# Patient Record
Sex: Male | Born: 1950
Health system: Southern US, Community
[De-identification: ages and names within clinical notes are randomized; demographics above are authoritative.]

## PROBLEM LIST (undated history)

## (undated) DIAGNOSIS — J449 Chronic obstructive pulmonary disease, unspecified: Secondary | ICD-10-CM

## (undated) DIAGNOSIS — J189 Pneumonia, unspecified organism: Secondary | ICD-10-CM

## (undated) DIAGNOSIS — R0602 Shortness of breath: Secondary | ICD-10-CM

## (undated) DIAGNOSIS — N189 Chronic kidney disease, unspecified: Secondary | ICD-10-CM

## (undated) DIAGNOSIS — J324 Chronic pansinusitis: Secondary | ICD-10-CM

## (undated) DIAGNOSIS — K219 Gastro-esophageal reflux disease without esophagitis: Secondary | ICD-10-CM

## (undated) DIAGNOSIS — R05 Cough: Secondary | ICD-10-CM

## (undated) DIAGNOSIS — I10 Essential (primary) hypertension: Secondary | ICD-10-CM

## (undated) DIAGNOSIS — R053 Chronic cough: Secondary | ICD-10-CM

## (undated) HISTORY — DX: Pneumonia, unspecified organism: J18.9

## (undated) HISTORY — DX: Chronic cough: R05.3

## (undated) HISTORY — DX: Chronic pansinusitis: J32.4

## (undated) HISTORY — DX: Chronic obstructive pulmonary disease, unspecified: J44.9

## (undated) HISTORY — DX: Cough: R05

---

## 2004-10-26 ENCOUNTER — Emergency Department (HOSPITAL_COMMUNITY): Admission: EM | Admit: 2004-10-26 | Discharge: 2004-10-26 | Payer: Self-pay | Admitting: Emergency Medicine

## 2006-02-11 HISTORY — PX: EYE SURGERY: SHX253

## 2006-08-12 HISTORY — PX: OTHER SURGICAL HISTORY: SHX169

## 2006-08-25 ENCOUNTER — Ambulatory Visit (HOSPITAL_COMMUNITY): Admission: EM | Admit: 2006-08-25 | Discharge: 2006-08-26 | Payer: Self-pay | Admitting: Emergency Medicine

## 2007-07-31 ENCOUNTER — Emergency Department (HOSPITAL_COMMUNITY): Admission: EM | Admit: 2007-07-31 | Discharge: 2007-07-31 | Payer: Self-pay | Admitting: Emergency Medicine

## 2007-07-31 DIAGNOSIS — J189 Pneumonia, unspecified organism: Secondary | ICD-10-CM

## 2007-07-31 HISTORY — DX: Pneumonia, unspecified organism: J18.9

## 2007-08-10 ENCOUNTER — Inpatient Hospital Stay (HOSPITAL_COMMUNITY): Admission: EM | Admit: 2007-08-10 | Discharge: 2007-08-10 | Payer: Self-pay | Admitting: Emergency Medicine

## 2007-08-18 ENCOUNTER — Ambulatory Visit: Admission: RE | Admit: 2007-08-18 | Discharge: 2007-08-18 | Payer: Self-pay | Admitting: Family Medicine

## 2007-10-22 ENCOUNTER — Ambulatory Visit: Payer: Self-pay | Admitting: Pulmonary Disease

## 2007-10-22 ENCOUNTER — Inpatient Hospital Stay (HOSPITAL_COMMUNITY): Admission: EM | Admit: 2007-10-22 | Discharge: 2007-10-26 | Payer: Self-pay | Admitting: Emergency Medicine

## 2007-11-05 DIAGNOSIS — K219 Gastro-esophageal reflux disease without esophagitis: Secondary | ICD-10-CM | POA: Insufficient documentation

## 2007-11-05 DIAGNOSIS — J4489 Other specified chronic obstructive pulmonary disease: Secondary | ICD-10-CM | POA: Insufficient documentation

## 2007-11-05 DIAGNOSIS — J449 Chronic obstructive pulmonary disease, unspecified: Secondary | ICD-10-CM | POA: Insufficient documentation

## 2007-11-06 ENCOUNTER — Ambulatory Visit: Payer: Self-pay | Admitting: Internal Medicine

## 2007-11-06 DIAGNOSIS — J159 Unspecified bacterial pneumonia: Secondary | ICD-10-CM | POA: Insufficient documentation

## 2007-11-30 ENCOUNTER — Telehealth (INDEPENDENT_AMBULATORY_CARE_PROVIDER_SITE_OTHER): Payer: Self-pay | Admitting: *Deleted

## 2007-11-30 ENCOUNTER — Ambulatory Visit: Payer: Self-pay | Admitting: Pulmonary Disease

## 2007-11-30 DIAGNOSIS — J449 Chronic obstructive pulmonary disease, unspecified: Secondary | ICD-10-CM | POA: Insufficient documentation

## 2007-11-30 DIAGNOSIS — R05 Cough: Secondary | ICD-10-CM

## 2007-11-30 DIAGNOSIS — R059 Cough, unspecified: Secondary | ICD-10-CM | POA: Insufficient documentation

## 2007-12-03 ENCOUNTER — Telehealth: Payer: Self-pay | Admitting: Internal Medicine

## 2007-12-05 ENCOUNTER — Inpatient Hospital Stay (HOSPITAL_COMMUNITY): Admission: EM | Admit: 2007-12-05 | Discharge: 2007-12-07 | Payer: Self-pay | Admitting: Emergency Medicine

## 2007-12-07 ENCOUNTER — Ambulatory Visit: Payer: Self-pay | Admitting: Internal Medicine

## 2007-12-15 ENCOUNTER — Ambulatory Visit: Payer: Self-pay | Admitting: Internal Medicine

## 2007-12-15 DIAGNOSIS — J45909 Unspecified asthma, uncomplicated: Secondary | ICD-10-CM | POA: Insufficient documentation

## 2008-02-03 ENCOUNTER — Telehealth: Payer: Self-pay | Admitting: Internal Medicine

## 2008-05-31 ENCOUNTER — Ambulatory Visit: Payer: Self-pay | Admitting: Internal Medicine

## 2008-06-21 ENCOUNTER — Ambulatory Visit: Payer: Self-pay | Admitting: Internal Medicine

## 2008-06-21 DIAGNOSIS — B370 Candidal stomatitis: Secondary | ICD-10-CM | POA: Insufficient documentation

## 2008-06-27 ENCOUNTER — Telehealth (INDEPENDENT_AMBULATORY_CARE_PROVIDER_SITE_OTHER): Payer: Self-pay | Admitting: *Deleted

## 2008-09-05 ENCOUNTER — Ambulatory Visit: Payer: Self-pay | Admitting: Internal Medicine

## 2008-09-07 ENCOUNTER — Encounter: Payer: Self-pay | Admitting: Internal Medicine

## 2008-09-28 ENCOUNTER — Telehealth: Payer: Self-pay | Admitting: Internal Medicine

## 2008-10-10 ENCOUNTER — Telehealth (INDEPENDENT_AMBULATORY_CARE_PROVIDER_SITE_OTHER): Payer: Self-pay | Admitting: *Deleted

## 2008-10-11 ENCOUNTER — Ambulatory Visit: Payer: Self-pay | Admitting: Internal Medicine

## 2008-11-08 ENCOUNTER — Ambulatory Visit: Payer: Self-pay | Admitting: Internal Medicine

## 2008-12-05 ENCOUNTER — Ambulatory Visit: Payer: Self-pay | Admitting: Internal Medicine

## 2009-03-14 DEATH — deceased

## 2009-04-28 ENCOUNTER — Emergency Department (HOSPITAL_COMMUNITY): Admission: EM | Admit: 2009-04-28 | Discharge: 2009-04-28 | Payer: Self-pay | Admitting: Emergency Medicine

## 2009-08-03 ENCOUNTER — Ambulatory Visit: Payer: Self-pay | Admitting: Family Medicine

## 2009-08-03 ENCOUNTER — Observation Stay (HOSPITAL_COMMUNITY)
Admission: EM | Admit: 2009-08-03 | Discharge: 2009-08-04 | Payer: Self-pay | Source: Home / Self Care | Admitting: Emergency Medicine

## 2009-09-13 ENCOUNTER — Ambulatory Visit: Payer: Self-pay | Admitting: Internal Medicine

## 2009-09-14 ENCOUNTER — Ambulatory Visit: Payer: Self-pay | Admitting: Cardiovascular Disease

## 2009-09-27 ENCOUNTER — Ambulatory Visit: Payer: Self-pay | Admitting: Internal Medicine

## 2009-10-18 ENCOUNTER — Telehealth: Payer: Self-pay | Admitting: Internal Medicine

## 2009-10-18 ENCOUNTER — Telehealth (INDEPENDENT_AMBULATORY_CARE_PROVIDER_SITE_OTHER): Payer: Self-pay | Admitting: *Deleted

## 2009-10-25 ENCOUNTER — Ambulatory Visit: Payer: Self-pay | Admitting: Internal Medicine

## 2009-10-31 ENCOUNTER — Ambulatory Visit: Payer: Self-pay | Admitting: Internal Medicine

## 2009-11-01 ENCOUNTER — Telehealth: Payer: Self-pay | Admitting: Internal Medicine

## 2009-11-02 ENCOUNTER — Telehealth (INDEPENDENT_AMBULATORY_CARE_PROVIDER_SITE_OTHER): Payer: Self-pay | Admitting: *Deleted

## 2009-11-02 ENCOUNTER — Encounter: Payer: Self-pay | Admitting: Internal Medicine

## 2009-11-07 ENCOUNTER — Telehealth (INDEPENDENT_AMBULATORY_CARE_PROVIDER_SITE_OTHER): Payer: Self-pay | Admitting: *Deleted

## 2009-11-14 ENCOUNTER — Ambulatory Visit: Payer: Self-pay | Admitting: Internal Medicine

## 2009-11-15 ENCOUNTER — Telehealth (INDEPENDENT_AMBULATORY_CARE_PROVIDER_SITE_OTHER): Payer: Self-pay | Admitting: *Deleted

## 2009-11-15 ENCOUNTER — Ambulatory Visit: Payer: Self-pay | Admitting: Cardiovascular Disease

## 2009-11-16 DIAGNOSIS — J019 Acute sinusitis, unspecified: Secondary | ICD-10-CM | POA: Insufficient documentation

## 2009-11-29 ENCOUNTER — Ambulatory Visit: Payer: Self-pay | Admitting: Internal Medicine

## 2010-01-10 ENCOUNTER — Ambulatory Visit: Payer: Self-pay | Admitting: Internal Medicine

## 2010-02-09 ENCOUNTER — Telehealth: Payer: Self-pay | Admitting: Internal Medicine

## 2010-02-28 ENCOUNTER — Ambulatory Visit: Admit: 2010-02-28 | Payer: Self-pay | Admitting: Internal Medicine

## 2010-03-04 ENCOUNTER — Encounter: Payer: Self-pay | Admitting: Internal Medicine

## 2010-03-08 ENCOUNTER — Inpatient Hospital Stay (HOSPITAL_COMMUNITY)
Admission: EM | Admit: 2010-03-08 | Discharge: 2010-03-12 | Payer: Self-pay | Source: Home / Self Care | Attending: Pulmonary Disease | Admitting: Pulmonary Disease

## 2010-03-08 LAB — COMPREHENSIVE METABOLIC PANEL
ALT: 19 U/L (ref 0–53)
Alkaline Phosphatase: 60 U/L (ref 39–117)
BUN: 17 mg/dL (ref 6–23)
Creatinine, Ser: 1.49 mg/dL (ref 0.4–1.5)
GFR calc Af Amer: 58 mL/min — ABNORMAL LOW (ref >60)
GFR calc non Af Amer: 48 mL/min — ABNORMAL LOW (ref >60)
Sodium: 138 mEq/L (ref 135–145)
Total Bilirubin: 0.4 mg/dL (ref 0.3–1.2)

## 2010-03-08 LAB — CBC
HCT: 43.5 % (ref 39.0–52.0)
Hemoglobin: 14.1 g/dL (ref 13.0–17.0)
MCV: 83.5 fL (ref 78.0–100.0)
RBC: 5.21 MIL/uL (ref 4.22–5.81)
RDW: 14.5 % (ref 11.5–15.5)

## 2010-03-08 LAB — POCT I-STAT 3, ART BLOOD GAS (G3+)
Acid-base deficit: 4 mmol/L — ABNORMAL HIGH (ref 0.0–2.0)
TCO2: 28 mmol/L (ref 0–100)
pH, Arterial: 7.192 — CL (ref 7.350–7.450)

## 2010-03-08 LAB — POCT I-STAT 3, VENOUS BLOOD GAS (G3P V)
Bicarbonate: 29.6 mEq/L — ABNORMAL HIGH (ref 20.0–24.0)
O2 Saturation: 78 %
pH, Ven: 7.021 — CL (ref 7.250–7.300)
pO2, Ven: 65 mmHg — ABNORMAL HIGH (ref 30.0–45.0)

## 2010-03-08 LAB — POCT I-STAT, CHEM 8
BUN: 22 mg/dL (ref 6–23)
Hemoglobin: 16 g/dL (ref 13.0–17.0)
Sodium: 139 mEq/L (ref 135–145)
TCO2: 30 mmol/L (ref 0–100)

## 2010-03-08 LAB — DIFFERENTIAL
Eosinophils Absolute: 1.2 10*3/uL — ABNORMAL HIGH (ref 0.0–0.7)
Lymphocytes Relative: 59 % — ABNORMAL HIGH (ref 12–46)
Lymphs Abs: 7.3 10*3/uL — ABNORMAL HIGH (ref 0.7–4.0)
Monocytes Absolute: 0.9 10*3/uL (ref 0.1–1.0)
Monocytes Relative: 7 % (ref 3–12)
Neutro Abs: 2.9 10*3/uL (ref 1.7–7.7)

## 2010-03-08 LAB — URINALYSIS, ROUTINE W REFLEX MICROSCOPIC
Bilirubin Urine: NEGATIVE
Ketones, ur: NEGATIVE mg/dL
Nitrite: NEGATIVE

## 2010-03-08 LAB — PROTIME-INR: Prothrombin Time: 14.2 seconds (ref 11.6–15.2)

## 2010-03-08 LAB — POCT CARDIAC MARKERS
CKMB, poc: 1.8 ng/mL (ref 1.0–8.0)
Troponin i, poc: <0.05 ng/mL (ref 0.00–0.09)

## 2010-03-08 LAB — ETHANOL: Alcohol, Ethyl (B): <5 mg/dL (ref 0–10)

## 2010-03-08 LAB — URINE MICROSCOPIC-ADD ON

## 2010-03-09 DIAGNOSIS — J9600 Acute respiratory failure, unspecified whether with hypoxia or hypercapnia: Secondary | ICD-10-CM

## 2010-03-09 DIAGNOSIS — J449 Chronic obstructive pulmonary disease, unspecified: Secondary | ICD-10-CM

## 2010-03-09 LAB — BLOOD GAS, ARTERIAL
Acid-Base Excess: 0.2 mmol/L (ref 0.0–2.0)
Drawn by: 33176
FIO2: 0.5 %
MECHVT: 620 mL
O2 Saturation: 99.1 %
RATE: 14 resp/min

## 2010-03-09 LAB — URINE CULTURE: Culture: NO GROWTH

## 2010-03-09 LAB — CARDIAC PANEL(CRET KIN+CKTOT+MB+TROPI)
CK, MB: 3.8 ng/mL (ref 0.3–4.0)
CK, MB: 3.9 ng/mL (ref 0.3–4.0)
Relative Index: 1.2 (ref 0.0–2.5)
Total CK: 305 U/L — ABNORMAL HIGH (ref 7–232)
Total CK: 305 U/L — ABNORMAL HIGH (ref 7–232)
Troponin I: 0.04 ng/mL (ref 0.00–0.06)
Troponin I: 0.02 ng/mL (ref 0.00–0.06)
Troponin I: 0.02 ng/mL (ref 0.00–0.06)

## 2010-03-09 LAB — GLUCOSE, CAPILLARY
Glucose-Capillary: 120 mg/dL — ABNORMAL HIGH (ref 70–99)
Glucose-Capillary: 148 mg/dL — ABNORMAL HIGH (ref 70–99)

## 2010-03-09 LAB — CBC
MCH: 26.8 pg (ref 26.0–34.0)
MCV: 81.1 fL (ref 78.0–100.0)
Platelets: 171 10*3/uL (ref 150–400)
RBC: 4.92 MIL/uL (ref 4.22–5.81)
RDW: 14.3 % (ref 11.5–15.5)

## 2010-03-09 LAB — BASIC METABOLIC PANEL
BUN: 12 mg/dL (ref 6–23)
Chloride: 106 mEq/L (ref 96–112)
Creatinine, Ser: 1.16 mg/dL (ref 0.4–1.5)
Glucose, Bld: 114 mg/dL — ABNORMAL HIGH (ref 70–99)

## 2010-03-09 LAB — LEGIONELLA ANTIGEN, URINE

## 2010-03-10 LAB — DIFFERENTIAL
Eosinophils Absolute: 0 10*3/uL (ref 0.0–0.7)
Eosinophils Relative: 0 % (ref 0–5)
Lymphocytes Relative: 7 % — ABNORMAL LOW (ref 12–46)
Lymphs Abs: 0.8 10*3/uL (ref 0.7–4.0)
Monocytes Absolute: 0.5 10*3/uL (ref 0.1–1.0)
Monocytes Relative: 5 % (ref 3–12)

## 2010-03-10 LAB — CBC
HCT: 42.9 % (ref 39.0–52.0)
MCH: 26.7 pg (ref 26.0–34.0)
MCV: 80.6 fL (ref 78.0–100.0)
Platelets: 176 10*3/uL (ref 150–400)
RDW: 14.4 % (ref 11.5–15.5)

## 2010-03-10 LAB — BASIC METABOLIC PANEL
BUN: 13 mg/dL (ref 6–23)
Chloride: 105 mEq/L (ref 96–112)
Creatinine, Ser: 0.94 mg/dL (ref 0.4–1.5)
GFR calc non Af Amer: >60 mL/min (ref >60)
Glucose, Bld: 130 mg/dL — ABNORMAL HIGH (ref 70–99)
Potassium: 4.3 mEq/L (ref 3.5–5.1)

## 2010-03-10 LAB — GLUCOSE, CAPILLARY
Glucose-Capillary: 133 mg/dL — ABNORMAL HIGH (ref 70–99)
Glucose-Capillary: 130 mg/dL — ABNORMAL HIGH (ref 70–99)
Glucose-Capillary: 161 mg/dL — ABNORMAL HIGH (ref 70–99)

## 2010-03-11 LAB — BASIC METABOLIC PANEL
CO2: 22 mEq/L (ref 19–32)
Chloride: 105 mEq/L (ref 96–112)
GFR calc Af Amer: >60 mL/min (ref >60)
Potassium: 4.4 mEq/L (ref 3.5–5.1)
Sodium: 135 mEq/L (ref 135–145)

## 2010-03-11 LAB — GLUCOSE, CAPILLARY
Glucose-Capillary: 140 mg/dL — ABNORMAL HIGH (ref 70–99)
Glucose-Capillary: 118 mg/dL — ABNORMAL HIGH (ref 70–99)

## 2010-03-11 LAB — CULTURE, RESPIRATORY W GRAM STAIN: Culture: NORMAL

## 2010-03-11 LAB — CBC
Hemoglobin: 13.8 g/dL (ref 13.0–17.0)
MCH: 27 pg (ref 26.0–34.0)
RBC: 5.11 MIL/uL (ref 4.22–5.81)

## 2010-03-12 DIAGNOSIS — J9600 Acute respiratory failure, unspecified whether with hypoxia or hypercapnia: Secondary | ICD-10-CM

## 2010-03-12 DIAGNOSIS — J449 Chronic obstructive pulmonary disease, unspecified: Secondary | ICD-10-CM

## 2010-03-12 LAB — BASIC METABOLIC PANEL
CO2: 25 mEq/L (ref 19–32)
Calcium: 8.6 mg/dL (ref 8.4–10.5)
Creatinine, Ser: 1.19 mg/dL (ref 0.4–1.5)
Glucose, Bld: 94 mg/dL (ref 70–99)

## 2010-03-12 LAB — GLUCOSE, CAPILLARY

## 2010-03-12 LAB — CULTURE, BLOOD (ROUTINE X 2): Culture  Setup Time: 201201270216

## 2010-03-13 ENCOUNTER — Telehealth (INDEPENDENT_AMBULATORY_CARE_PROVIDER_SITE_OTHER): Payer: Self-pay | Admitting: *Deleted

## 2010-03-13 LAB — GLUCOSE, CAPILLARY

## 2010-03-15 LAB — CULTURE, BLOOD (ROUTINE X 2)
Culture  Setup Time: 201201270216
Culture: NO GROWTH

## 2010-03-15 NOTE — Progress Notes (Signed)
Summary: sputum still yellow- rx with augmentin x 10 more days  Phone Note Call from Patient Call back at [facility] Phone [phone]   Caller: Son Call For: [facility] Summary of Call: I MADE AN ERROR IN SCHEDULING (SORRY). PT CAME IN WITH HIS SON. THEY HAVE RESCHEDULED FOR NEXT WK - BUT WERE VERY NICE. LORI GAVE SON ANOTHER PROAIR AS REQUESTED. SON WANTS TO KNOW IF PT NEEDS A REFILL OF AMOXICILLIN OR SHOULD PT HAVE A CT? [facility] AT HOME # ABOVE OR CELL [phone] AND ADVISE. NOTE: APPT W/ MW IS 9/14. THANKS AND AGAIN, SORRY.  Initial call taken by: Tivis Ringer, CNA,  October 18, 2009 10:50 AM  Follow-up for Phone Call        [facility]. Need to know if pt's cough and congestion is better or worse since finshing augmentin.Michel Bickers [facility]  October 18, 2009 10:58 AM  [phone] Christen Bame, pt's son returned call to [place].  Please call him back on his cell. Follow-up by: Eugene Gavia,  October 18, 2009 11:18 AM  Additional Follow-up for Phone Call Additional follow up Details #1::        Per pt's son, cough is prod with yellowish mucus. Pt is not taking anything for the cough. Sinuses are clear per pt son but pt is using his proair more aoften due to SOB that comes with the cough. Pt is sch for follow-up on 9/14 with MW. Please advise if pt needs OV before then or recs. Son wants to know if pt should have another RX for the Augmentin.Michel Bickers CMA  October 18, 2009 11:30 AM if mucus is anything but white or very pale beige needs another 10 days augmentin  875 two times a day  Additional Follow-up by: Nyoka Cowden MD,  October 18, 2009 3:31 PM    Additional Follow-up for Phone Call Additional follow up Details #2::    Called and spoke with pt's son Christen Bame.  He states that pt's sputum is more yellow than white, he states would not describe this as beige.  Rx for augmentin was sent to [facility].  Pt's son aware. Follow-up by: Vernie Murders,  October 18, 2009 3:46  PM  New/Updated Medications: AUGMENTIN 875-125 MG TABS (AMOXICILLIN-POT CLAVULANATE) 1 by mouth two times a day until gone Prescriptions: AUGMENTIN 875-125 MG TABS (AMOXICILLIN-POT CLAVULANATE) 1 by mouth two times a day until gone  #20 x 0   Entered by:   Vernie Murders   Authorized by:   Nyoka Cowden MD   Signed by:   Vernie Murders on 10/18/2009   Method used:   Electronically to        [facility] (retail)       [address]       [place], [place]  [address]       Ph: [phone]       Fax: [phone]   RxID:   [id number]

## 2010-03-15 NOTE — Assessment & Plan Note (Signed)
Summary: Pulmonary/ f/u ov with hfa now 90% > decrease pred to 5 mg/day   Primary Provider/Referring Provider:  Dr. Janace Hoard  CC:  Cough- resolved.  History of Present Illness: 60 yo vietnamese male  quit smoking 1999 with AB vs  COPD. Was admitted 10/22/2007    AE-COPD antibiotics and steroids. Extubated on 9/11 and discharged 9/14 on symbicort and prednisone taper. Feels well.    Spirometry and walking sats  normal when treated.    September 13, 2009 cc cough "for over 2 yrs"- l .  Cough is prod with yellow sputum and is worse at bedtime.  He also c/o runny nose. improves sev weeks to maybe a month after a cortisone.  rec sinus ct > positive  sinusitis rx augmentin x 10-20 days, only took  10 because better and maintained acid suppression.  August 17, 2011cc cough is better but not gone. Still has some chest congestion and sob at times esp in am.   rec 10 days augmentin some better transiently  then worse again when finished so complete 10 more days and then do ct sinus > never had it.  October 25, 2009 ov cc SOB is worse with exertion and at rest...cough is more prod with yellow to green mucus...not sleeping well  worse x 1 week despite maintaining gerd rx.   Marked increase in saba, both hfa and neb which we were not aware he had per our med record. rec 21 days augmentin then ct sinus     see page 2    October 31, 2009 ov  c/o worsening cough x 3 days- prod with white to orange colored sputum.  He states that cough gets worse at night with wheezing and has been unable to sleep for the past 3 nights. requested to bring all meds, brought empty proaire and no neb solutions and not sure what he's putting in machine.    doesn't think any of his meds are working any more, even transiently, though not sure about this hx as didn't bring fm member>> rx pred 20mg  ceiliing/10mg  floor  November 14, 2009 --Presents for follow up and med review.  His son is with him today and helps with his meds. We  reviewed his meds and organized them into a med calendar. Recent CT sinus showed acute sinusitis, currently on augmentin x 21 days. recent xray with increased atx on right. He has couple days of augmentin left. He is feeling better. Prednisone was increased last ov due increased cough. Cough and wheezing is better.  rec hold pred at 10 mg daily.  November 29, 2009 ov cc cough, resolved, breathing fine.  using Calendar better with help of son.  Pt denies any significant sore throat, dysphagia, itching, sneezing,  nasal congestion or excess secretions,  fever, chills, sweats, unintended wt loss, pleuritic or exertional cp, hempoptysis, change in activity tolerance  orthopnea pnd or leg swelling Pt also denies any obvious fluctuation in symptoms with weather or environmental change or other alleviating or aggravating factors.     Pt denies any increase in rescue therapy over baseline, denies waking up needing it or having early am exacerbations of coughing/wheezing/ or dyspnea     Current Medications (verified): 1)  Symbicort 160-4.5 Mcg/act  Aero (Budesonide-Formoterol Fumarate) .... 2 Puffs First Thing  in Am and 2 Puffs Again in Pm About 12 Hours Later 2)  Prilosec Otc 20 Mg Tbec (Omeprazole Magnesium) .... Take  One 30-60 Min Before First Meal  of The Day 3)  Pepcid Ac Maximum Strength 20 Mg Tabs (Famotidine) .... One At Bedtime 4)  Prednisone 10 Mg  Tabs (Prednisone) .... Take 1 Tablet By Mouth Once A Day 5)  Proair Hfa 108 (90 Base) Mcg/act Aers (Albuterol Sulfate) .... 2 Puffs Every 4-6 Hrs As Needed If Short of Breath 6)  Mucinex Dm Maximum Strength 60-1200 Mg Xr12h-Tab (Dextromethorphan-Guaifenesin) .... Take 1-2 Tablets Every 12 Hours As Needed 7)  Tramadol Hcl 50 Mg  Tabs (Tramadol Hcl) .Marland Kitchen.. 1 Tab Every 4 Hours As Needed For Excessive Coughing 8)  Zyrtec Allergy 10 Mg Tabs (Cetirizine Hcl) .... Take 1 Tab By Mouth At Bedtime As Needed  Allergies (verified): No Known Drug Allergies  Past  History:  Past Medical History: 1) pneumonia bilateral, diagnosed on July 31, 2007.    2) CT negative for PE - June 2009 3) VDRF -sept 2009. Admitted for 4 days. Intubated for 1 day 4) HIV Negative - sept 2009 at [facility]  ASTHMA -      - HFA 100% effective tecnique September 27, 2009 > 75% October 31, 2009 > 90% November 29, 2009        Spirometry 11/06/2007 -> normal while on symbicort >Spirometry 05/31/2008: fev1 2.8L/65%, FVC 3.94L/74%, Ratio 71 (90), Fef 25-75%: 1.9L/44% >Spirometry 06/21/2008:  Fev1 2.76L/64%, FVC 3.84L/72%, Ratio 72 (91). -cont advair hfa >spiro 09/05/2008: Fev1 2.82L/76%, FVC 4.05L/77%, Ratio 70 (70) - cont advair 230 hfa, start singualr > d/c September 13, 2009 as cough no better on singulair Chronic cough ? etiology     - no better on qvar and singulair both stopped September 13, 2009 > no worse cough September 27, 2009      - Sinus CT 09/14/09 > pansinusitis       -  rx 21 days rx started  c augment October 25, 2009 >> repeat sinus ct:neg 11/15/09     - Daily prednisone rx October 31, 2009 >>> Health Maintenance      - Pneumovax given October 31, 2009  COMPLEX MED REGIMEN-Meds reviewed with pt education and computerized med calendar completed 11/14/09  Past Pulmonary History:  Pulmonary History: 1) pneumonia bilateral, diagnosed on July 31, 2007.  He was treated with azithromycin. Seen on CT chest (personally reviewed 11/06/2007) 2) CT negative for PE - June 2009 3) VDRF -sept 2009. Admitted for 4 days. Intubated for 1 day 4) HIV Negative - sept 2009 at [facility] 5)  COPD/ASTHMA -  >COPD based on prior smoking and cxr changes and june/sept 2009 admits for acute resp failure NOS.  > Darrold Junker has had spirometry till 11/06/2007.  > Spirometry 11/06/2007 -> normal while on symbicort > ASTHMA - diagnosed 12/15/2007 by Dr. Shelle Iron- based on decompensation off inhaled steroids in Oct 2009 and normalization of symptoms when symbicort restarted >Spirometry 05/31/2008: fev1 2.8L/65%, FVC  3.94L/74%, Ratio 71 (90), Fef 25-75%: 1.9L/44% >Spirometry 06/21/2008:  Fev1 2.76L/64%, FVC 3.84L/72%, Ratio 72 (91). -cont advair hfa >spiro 09/05/2008: Fev1 2.82L/76%, FVC 4.05L/77%, Ratio 70 (70) - cont advair 230 hfa, start singualr  Vital Signs:  Patient profile:   60 year old male Weight:      179 pounds O2 Sat:      98 % on Room air Temp:     98.1 degrees F oral Pulse rate:   80 / minute BP sitting:   122 / 84  (left arm)  Vitals Entered By: Vernie Murders (November 29, 2009 10:02 AM)  O2 Flow:  Room  air  Physical Exam  Additional Exam:  amb vietnamese male limited English, nad  wt 174 > 175  September 27, 2009  > 170 October 25, 2009  > 169 October 31, 2009 >>180 11/14/09  > 180 November 29, 2009  HEENT:  [place]/AT, , EACs-clear, TMs-wnl, NOSE-pale , THROAT-clear, no exudate NECK:  Supple w/ fair ROM; no JVD; normal carotid impulses w/o bruits; no thyromegaly or nodules palpated; no lymphadenopathy. RESP  clear to A and P  CARD:  RRR, no m/r/g   GI:   Soft & nt; nml bowel sounds; no organomegaly or masses detected. [place]co: Warm bil,  no calf tenderness edema, clubbing, pulses intact  .    Impression & Recommendations:  Problem # 1:  ASTHMA (ICD-493.90) All goals of asthma met including optimal function and elimination of symptoms with minimum need for rescue therapy. Contingencies discussed today including the rule of two's, albeit on a complex regimen than contains prednisone.  Now that sinus dz addressed should be able to simplify rx  I spent extra time with the patient today explaining optimal mdi  technique.  This improved from  75-90% p coaching  Try to taper prednisone to 5 mg per day  See instructions for specific recommendations   Problem # 2:  SINUSITIS, ACUTE (ICD-461.9)  The following medications were removed from the medication list:    Augmentin 875-125 Mg Tabs (Amoxicillin-pot clavulanate) ..... By mouth twice daily with bfast and supper and large glass of  water His updated medication list for this problem includes:    Mucinex Dm Maximum Strength 60-1200 Mg Xr12h-tab (Dextromethorphan-guaifenesin) .Marland Kitchen... Take 1-2 tablets every 12 hours as needed  CT 11/15/09 reviewed, resolved.  Medications Added to Medication List This Visit: 1)  Symbicort 160-4.5 Mcg/act Aero (Budesonide-formoterol fumarate) .... 2 puffs first thing  in am and 2 puffs again in pm about 12 hours later  Other Orders: Est. Patient Level IV (04540)  Patient Instructions: 1)  See calendar for specific medication instructions and bring it back for each and every office visit for every healthcare provider you see.  Without it,  you may not receive the best quality medical care that we feel you deserve.  2)  Please schedule a follow-up appointment in 6 weeks, sooner if needed  3)  LATE ADD:   reduce Prednisone to 5 mg PER DAY Prescriptions: SYMBICORT 160-4.5 MCG/ACT  AERO (BUDESONIDE-FORMOTEROL FUMARATE) 2 puffs first thing  in am and 2 puffs again in pm about 12 hours later  #1 x 11   Entered and Authorized by:   Nyoka Cowden MD   Signed by:   Nyoka Cowden MD on 11/29/2009   Method used:   Electronically to        [facility] (retail)       [address]       [place], [place]  [address]       Ph: [phone]       Fax: [phone]   RxID:   [id number]   Appended Document: Pulmonary/ f/u ov with hfa now 90% > decrease pred to 5 mg/day see LATE ADD for instructions  Appended Document: Pulmonary/ f/u ov with hfa now 90% > decrease pred to 5 mg/day Spoke with pt's son Ron and advised that pt needs to reduce his prednisone to 5 mg per day.  Son verbalized understanding and states will let the pt know.

## 2010-03-15 NOTE — Assessment & Plan Note (Signed)
Summary: Pulmonary/ ext ov with hfa teaching   Visit Type:  Follow-up Primary Kaeo Jacome/Referring Aquilla Voiles:  Dr. Janace Hoard  CC:  Patient is here for follow-up...SOB is worse with exertion and at rest...cough is more prod with yellow to green mucus...not sleeping well.  History of Present Illness:  11/06/2007: p hosp f/u ov:  60 year old male with hx of COPD. Was admitted 10/22/2007 with acute resp failure and hypoxemia (abg 7.34/43/112 ? post intubaiton) with cxr showing copd but without infiltrates. D-dimer slightly high at 0.86 but not evaluated furhter in context of june 2009 when  d-dimer >20/ ct-angio being negative for pe. BNP <100 at admit. Treated for AE-COPD antibiotics and steroids. Extubated on 9/11 and discharged 9/14 on symbicort and prednisone taper. Feels well.    Spirometry and walking desat test are normal. Finishing pred taper 9/25  REC: taper symbicort to LABA alone/foradil  OV 11/30/2007 Urgent basis Dr. Shelle Iron. Severe cough with near cough syncope., chest soreness and increased sob. Diffuse wheeze on exam. No spiro done. Asthma exacerbation clinically suspected due to worsning symptoms following med taper REC: restart symbicort   OV 12/15/2007:  Feels well afer going back on symbicort. Using albuterol <2t/week for rescue. But taking tussionex daily once for cough suppression but he does not think he needs it.  REC: stop tussionex, take albuterol as needed, take symbicort scheduled, attend asthma ed   OV 05/31/2008: Scheduled followup of asthma/copd. Poor historian v [employer]. Since last OV, he went to [facility] on 3/9 with ae-asthma and treated with prednisone. He had initial improvement but  re-presented for followup 05/24/2008 with recurrence of cough. IIn addition, was having  ? new yellow sputum. -> started on pred taper (ov day = 7/7) on 4/14 for clinical dx of mild AE-COPD/ASthma. A CXR per report review showed  RML infiltrate. Since then, he says he is NO better. SPIRO  today - Fev1 2.8L/65%, FVC 3.9L/74%, Ratio 71.  CXR - clear.  REC: Rx doxy x 6 days, change doxy -> advair hfa, restart 2 week pred taper  OV 06/21/2008: NEW THRUSH - Treated and taught to improve hfa technique. Ashtma itself VERY WELL CONTROLLED. Spirometry today is unchanged although symptoms are very well controlled. Fev1 today is 2.76L/64%, FVC 3.84L/72%, Ratio 72 (91). REcommened to continue advair hfa 2 puff two times a day   OV 09/05/2008. Followup asthma. Here with 2 sons who are acting as interpreter. Says was doing well till 1 month ago. He feels summer heat has made asthma worse. Son notices he is coughing a lot esp at night and in the heat. Lot of sneezing, sinus drainage and sOme associated yellow sputum and wheezing too. Albuterol helps. He  has used albuterol only 1-2 times this past week but in talking to him it appears he is more symptomatic than that.    October 11, 2008 with NP Tammy--Presents for 1 month folllow up - was given nasocort and singulair but dint help.  states cough is unchanged. Accompanied by son as interpretor. Describes minimally productive, mainly dry cough worse at night. c/o tickle in throat, drainage.  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 -no significant cough at work.   November 08, 2008 wit NP Tammy --Pt here to the office today with persistent intermittently cough.  Last visit treated w/   Prednisone taper, cough/rhinitis prevention. Finished Prednisone and has been taken Delsym for a week but the cough didn't go away.  C/o cough more  at night .  Has intermitently cough up min. amount of phlegm thin yellow in color.  still taking occ tussionex. No need for albuterol at allOV 12/05/2008. Followup ASthma/COPD and chronic nocturnal cough Started him on singulair back then but   has no idea if he is taking it. Since then he saw NP TammyParrett once in August and once in September and her notes do not mention singulair. WE called [facility] and was told he  only filled it once on 09/05/2008. Overall doing well. Taking advair hfa 2 puff two times a day, diligiently and albuterol as needed  only. Has not used albuterol in >2 months but is still c/o persistent nocturanl cough. He got pred taper in August and in syptomatic Rx in Sept visits including PPD but he did not come back for read. Cough persists.   C/o cough more at night .  Has intermitently cough up min. amount of phlegm thin yellow in color. rec qvar  September 13, 2009 Acute visit. Pt c/o persistant cough "for over 2 yrs"- last seen here in Oct 2010.  Cough is prod with yellow sputum and is worse at bedtime.  He also c/o runny nose. improves sev weeks to maybe a month after a cortisone.  rec sinus ct > positive  sinusitis rx augmentin x 10-20 days, only 10 because better and maintained acid suppression.  September 27, 2009 Cough follow-up. the patient says his cough is better but not gone. Still has some chest congestion and sob at times esp in am.   rec 10 days augmentin some better transiently  then worse again when finished.   October 25, 2009 ov cc SOB is worse with exertion and at rest...cough is more prod with yellow to green mucus...not sleeping well  worse x 1 week despite maintaining gerd rx.  Pt denies any significant sore throat, dysphagia, itching, sneezing,     fever, chills, sweats, unintended wt loss, pleuritic or exertional cp, hempoptysis,   orthopnea pnd or leg swelling. Pt also denies any obvious fluctuation in symptoms with weather or environmental change or other alleviating or aggravating factors.   Marked increase in saba, both hfa and neb which we were not aware he had     Current Medications (verified): 1)  Omeprazole 20 Mg Cpdr (Omeprazole) .... Take  One 30-60 Min Before First Meal of The Day 2)  Pepcid Ac Maximum Strength 20 Mg Tabs (Famotidine) .... One At Bedtime 3)  Tussionex Pennkinetic Er 8-10 Mg/55ml Lqcr (Chlorpheniramine-Hydrocodone) .Marland Kitchen.. 1 Tsp At Bedtime As Needed 4)   Proair Hfa 108 (90 Base) Mcg/act Aers (Albuterol Sulfate) .... 2 Puffs Every 4-6 Hrs As Needed If Short of Breath  Allergies (verified): No Known Drug Allergies  Past History:  Past Medical History: 1) pneumonia bilateral, diagnosed on July 31, 2007.    2) CT negative for PE - June 2009 3) VDRF -sept 2009. Admitted for 4 days. Intubated for 1 day 4) HIV Negative - sept 2009 at [facility]  ASTHMA -      - HFA 100% effective tecnique September 27, 2009        Spirometry 11/06/2007 -> normal while on symbicort      based on decompensation off inhaled steroids in Oct 2009 and normalization of symptoms when symbicort     restarted >Spirometry 05/31/2008: fev1 2.8L/65%, FVC 3.94L/74%, Ratio 71 (90), Fef 25-75%: 1.9L/44% >Spirometry 06/21/2008:  Fev1 2.76L/64%, FVC 3.84L/72%, Ratio 72 (91). -cont advair hfa >spiro 09/05/2008: Fev1 2.82L/76%, FVC 4.05L/77%,  Ratio 70 (70) - cont advair 230 hfa, start singualr > d/c September 13, 2009 as cough no better Chronic cough ? etiology     - no better on qvar and singulair both stopped September 13, 2009 > no worse cough September 27, 2009      - Sinus CT 09/14/09 > pansinusitis  > 21 days rx started  c augment October 25, 2009 >> repeat sinus ct:  Past Pulmonary History:  Pulmonary History: 1) pneumonia bilateral, diagnosed on July 31, 2007.  He was treated with azithromycin. Seen on CT chest (personally reviewed 11/06/2007) 2) CT negative for PE - June 2009 3) VDRF -sept 2009. Admitted for 4 days. Intubated for 1 day 4) HIV Negative - sept 2009 at [facility])  COPD/ASTHMA -  >COPD based on prior smoking and cxr changes and june/sept 2009 admits for acute resp failure NOS.  > Darrold Junker has had spirometry till 11/06/2007.  > Spirometry 11/06/2007 -> normal while on symbicort > ASTHMA - diagnosed 12/15/2007 by Dr. Shelle Iron- based on decompensation off inhaled steroids in Oct 2009 and normalization of symptoms when symbicort restarted >Spirometry 05/31/2008: fev1 2.8L/65%, FVC 3.94L/74%,  Ratio 71 (90), Fef 25-75%: 1.9L/44% >Spirometry 06/21/2008:  Fev1 2.76L/64%, FVC 3.84L/72%, Ratio 72 (91). -cont advair hfa >spiro 09/05/2008: Fev1 2.82L/76%, FVC 4.05L/77%, Ratio 70 (70) - cont advair 230 hfa, start singualr  Vital Signs:  Patient profile:   60 year old male Height:      74 inches (187.96 cm) Weight:      170.38 pounds (77.45 kg) BMI:     21.95 O2 Sat:      94 % on Room air Temp:     97.4 degrees F (36.33 degrees C) oral Pulse rate:   86 / minute BP sitting:   120 / 84  (right arm) Cuff size:   regular  Vitals Entered By: Michel Bickers CMA (October 25, 2009 10:38 AM)  O2 Sat at Rest %:  94 O2 Flow:  Room air CC: Patient is here for follow-up...SOB is worse with exertion and at rest...cough is more prod with yellow to green mucus...not sleeping well Comments Medications reviewed with patient Michel Bickers [facility]  October 25, 2009 10:39 AM   Physical Exam  Additional Exam:  amb vietnamese male limited English, nad  wt 174 > 175  September 27, 2009  > 170 October 25, 2009  HEENT:  /AT, , EACs-clear, TMs-wnl, NOSE-pale , THROAT-clear, no exudate NECK:  Supple w/ fair ROM; no JVD; normal carotid impulses w/o bruits; no thyromegaly or nodules palpated; no lymphadenopathy. RESP   insp and exp rhonchi bilaterally CARD:  RRR, no m/r/g   GI:   Soft & nt; nml bowel sounds; no organomegaly or masses detected. Musco: Warm bil,  no calf tenderness edema, clubbing, pulses intact  .    Impression & Recommendations:  Problem # 1:  ASTHMA (ICD-493.90)   DDX of  difficult airways managment all start with A and  include Adherence, Ace Inhibitors, Acid Reflux, Active Sinus Disease, Alpha 1 Antitripsin deficiency, Anxiety masquerading as Airways dz,  ABPA,  allergy(esp in young), Aspiration (esp in elderly), Adverse effects of DPI,  Active smokers, plus one B  = Beta blocker use..   Adherence: I spent extra time with the patient today explaining optimal mdi  technique.  This  improved from  50-75% Each maintenance medication was reviewed in detail including most importantly the difference between maintenance prns and under what circumstances the prns are  to be used.  In addition, these two groups (for which the patient should keep up with refills) were distinguished from a third group :  meds that are used only short term with the intent to complete a course of therapy and then not refill them.  The med list was then fully reconciled and reorganized to reflect this important distinction.  ? acitive sinusitis:  See instructions for specific recommendations   Medications Added to Medication List This Visit: 1)  Tramadol Hcl 50 Mg Tabs (Tramadol hcl) .... One to two by mouth every 4-6 hours if needed for excess cough 2)  Prednisone 10 Mg Tabs (Prednisone) .... 4 each am x 2 days,  3 x 2days, 2x2 days, and 1x2 days 3)  Augmentin 875-125 Mg Tabs (Amoxicillin-pot clavulanate) .... By mouth twice daily with bfast and supper and large glass of water  Other Orders: Misc. Referral (Misc. Ref) Est. Patient Level IV (16109)  Patient Instructions: 1)  Take Augmentin 875 mg one twice daily x 21 days then CT sinus and if still bad will need ENT eval 2)  See Patient Care Coordinator before leaving for sinus ct 3)  Work on inhaler technique:  relax and blow all the way out then take a nice smooth deep breath back in, triggering the inhaler at same time you start breathing in  4)  only use use proaire if needed the neb to back it up but use either one up to every 4 hours and if not better after neb treatment go to ER 5)  Prednisone 10 mg 4 each am x 2 days,  3 x 2days, 2x2 days, and 1x2 days 6)  Take delsym two tsp every 12 hours and add tramadol 50 mg up to 2 every 4 hours to suppress the urge to cough. Swallowing water or using ice chips/non mint and menthol containing candies (such as lifesavers or sugarless jolly ranchers) are also effective.  7)  LATE ADD; IF NOT BETTER BY THE  END OF PREDNISONE AND STILL NEEDING ALBUTEROL MORE THAN TWICE A WEEK NEEDS TO BE RESTARTED ON SYMBICORT 160 2 puffs first thing  in am and 2 puffs again in pm about 12 hours later  Prescriptions: TRAMADOL HCL 50 MG  TABS (TRAMADOL HCL) One to two by mouth every 4-6 hours if needed for excess cough  #40 x 0   Entered and Authorized by:   Nyoka Cowden MD   Signed by:   Nyoka Cowden MD on 10/25/2009   Method used:   Electronically to        [facility] (retail)       [address]       [place], [place]  [address]       Ph: [phone]       Fax: [phone]   RxID:   [id number] AUGMENTIN 875-125 MG  TABS (AMOXICILLIN-POT CLAVULANATE) By mouth twice daily with bfast and supper and large glass of water  #42 x 0   Entered and Authorized by:   Nyoka Cowden MD   Signed by:   Nyoka Cowden MD on 10/25/2009   Method used:   Electronically to        [facility] (retail)       [address]       [place], [place]  [address]  Ph: [phone]       Fax: [phone]   RxID:   [id number] PREDNISONE 10 MG  TABS (PREDNISONE) 4 each am x 2 days,  3 x 2days, 2x2 days, and 1x2 days  #20 x 0   Entered and Authorized by:   Nyoka Cowden MD   Signed by:   Nyoka Cowden MD on 10/25/2009   Method used:   Electronically to        [facility] (retail)       [address]       [place], [place]  [address]       Ph: [phone]       Fax: [phone]   RxID:   [id number]   Appended Document: Pulmonary/ ext ov with hfa teaching Spoke with pt's son Christen Bame and notified of the late add instructions per MW.  Christen Bame verbalized understanding.

## 2010-03-15 NOTE — Progress Notes (Signed)
Summary: ok for oow thru 11/16/09 - LMTCB x 2  Phone Note Call from Patient Call back at [phone]   Caller: Son ronnie Call For: wert Summary of Call: pt would like to be taken out of work for 2 weeks  due to cough and lack of sleep. Initial call taken by: Rickard Patience,  November 02, 2009 8:27 AM  Follow-up for Phone Call        Son states pt is requestign a note to be out of work x 2 weeks due to cough and lack of sleep from the cough. They request the note to begin today 11-02-09 and end 11-16-09/ Please advise if ok to write letter. Carron Curie CMA  November 02, 2009 10:31 AM ok with me  Follow-up by: Nyoka Cowden MD,  November 02, 2009 4:18 PM  Additional Follow-up for Phone Call Additional follow up Details #1::        Letter printed and signed by MW.  Letter placed at front for pick up but will mail if pt would like.  LMOMTCB to inform pt of this.   Gweneth Dimitri RN  November 02, 2009 4:53 PM  LMOMTCB Vernie Murders  November 03, 2009 3:49 PM  will forward to leslie's inbox per her request. Boone Master CNA/MA  November 03, 2009 3:50 PM      Additional Follow-up for Phone Call Additional follow up Details #2::    Msg left on machine for pt to be aware that letter is up front for pick up. Follow-up by: Vernie Murders,  November 07, 2009 10:31 AM

## 2010-03-15 NOTE — Progress Notes (Signed)
Summary: itching > try benadryl, cont symbicort and prednisone  Phone Note Call from Patient Call back at [phone]   Caller: son, Nathaniel Hartman Call For: wert Summary of Call: pt's son calling stating pt c/o itching all over his body x 1 week.  Son wonders if this is an allergic reaction to a medication he is on.  Pt saw MW last week 10/31/2009 and was started on Symbicort and Prednisone.  Son states he has been on both of these meds previously without problems.  Son states he doesn't want to stop either of these meds because they are helping pt's breathing but wanted to know if pt can take Benadryl to help with the itching.  Pt hasn't taken anything OTC yet.  Son wanted to get MD's opinion first on what he can take that won't interfere with his other meds.  MW out of office this week.  Will forward message to doc of the day to address. Arman Filter LPN  November 07, 2009 9:27 AM   Initial call taken by: Arman Filter LPN,  November 07, 2009 9:27 AM  Follow-up for Phone Call        per sn ok to continue symbicort and prednisone and ok to use benadryl 25mg  every 4 hrs as needed for itching if symptoms do not improve will need ov or if after hours go to the er   Philipp Deputy [facility]  November 07, 2009 1:53 PM   lmtcb  Philipp Deputy [facility]  November 07, 2009 1:53 PM   Additional Follow-up for Phone Call Additional follow up Details #1::        Called, spoke with pt's son, Nathaniel Hartman.  He was informed of above recs per SN and verbalized understanding.   Additional Follow-up by: Gweneth Dimitri RN,  November 08, 2009 10:23 AM

## 2010-03-15 NOTE — Assessment & Plan Note (Signed)
Summary: Pulmonary/ ext f/u ov with hfa 75%   Primary [name]/Referring [name]:  Dr. Janace Hoard  CC:  Acute visit. Pt c/o persistant cough "for over 2 yrs"- last seen here in Oct 2010.  Cough is prod with yellow sputum and is worse at bedtime.  He also c/o runny nose.Nathaniel Hartman  History of Present Illness:  11/06/2007: p hosp f/u ov:  60 year old male with hx of COPD. Was admitted 10/22/2007 with acute resp failure and hypoxemia (abg 7.34/43/112 ? post intubaiton) with cxr showing copd but without infiltrates. D-dimer slightly high at 0.86 but not evaluated furhter in context of june 2009 when  d-dimer >20/ ct-angio being negative for pe. BNP <100 at admit. Treated for AE-COPD antibiotics and steroids. Extubated on 9/11 and discharged 9/14 on symbicort and prednisone taper. Feels well.    Spirometry and walking desat test are normal. Finishing pred taper 9/25  REC: taper symbicort to LABA alone/foradil  OV 11/30/2007 Urgent basis Dr. Shelle Iron. Severe cough with near cough syncope., chest soreness and increased sob. Diffuse wheeze on exam. No spiro done. Asthma exacerbation clinically suspected due to worsning symptoms following med taper REC: restart symbicort   OV 12/15/2007:  Feels well afer going back on symbicort. Using albuterol <2t/week for rescue. But taking tussionex daily once for cough suppression but he does not think he needs it.  REC: stop tussionex, take albuterol as needed, take symbicort scheduled, attend asthma ed   OV 05/31/2008: Scheduled followup of asthma/copd. Poor historian v [employer]. Since last OV, he went to [facility] on 3/9 with ae-asthma and treated with prednisone. He had initial improvement but  re-presented for followup 05/24/2008 with recurrence of cough. IIn addition, was having  ? new yellow sputum. -> started on pred taper (ov day = 7/7) on 4/14 for clinical dx of mild AE-COPD/ASthma. A CXR per report review showed  RML infiltrate. Since then, he says he is NO better. SPIRO  today - Fev1 2.8L/65%, FVC 3.9L/74%, Ratio 71.  CXR - clear.  REC: Rx doxy x 6 days, change doxy -> advair hfa, restart 2 week pred taper  OV 06/21/2008: NEW THRUSH - Treated and taught to improve hfa technique. Ashtma itself VERY WELL CONTROLLED. Spirometry today is unchanged although symptoms are very well controlled. Fev1 today is 2.76L/64%, FVC 3.84L/72%, Ratio 72 (91). REcommened to continue advair hfa 2 puff two times a day   OV 09/05/2008. Followup asthma. Here with 2 sons who are acting as interpreter. Says was doing well till 1 month ago. He feels summer heat has made asthma worse. Son notices he is coughing a lot esp at night and in the heat. Lot of sneezing, sinus drainage and sOme associated yellow sputum and wheezing too. Albuterol helps. He  has used albuterol only 1-2 times this past week but in talking to him it appears he is more symptomatic than that.    October 11, 2008 with NP Tammy--Presents for 1 month folllow up - was given nasocort and singulair but dint help.  states cough is unchanged. Accompanied by son as interpretor. Describes minimally productive, mainly dry cough worse at night. c/o tickle in throat, drainage.  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 -no significant cough at work.   November 08, 2008 wit NP Tammy --Pt here to the office today with persistent intermittently cough.  Last visit treated w/   Prednisone taper, cough/rhinitis prevention. Finished Prednisone and has been taken Delsym for a week but  the cough didn't go away.  C/o cough more at night .  Has intermitently cough up min. amount of phlegm thin yellow in color. OV 12/05/2008. Followup ASthma/COPD and chronic nocturnal cough Started him on singulair back then but   has no idea if he is taking it. Since then he saw NP TammyParrett once in August and once in September and her notes do not mention singulair. WE called [facility] and was told he only filled it once on 09/05/2008. Overall doing well.  Taking advair hfa 2 puff two times a day, diligiently and albuterol as needed  only. Has not used albuterol in >2 months but is still c/o persistent nocturanl cough. He got pred taper in August and in syptomatic Rx in Sept visits including PPD but he did not come back for read. Cough persists.   C/o cough more at night .  Has intermitently cough up min. amount of phlegm thin yellow in color. rec qvar  September 13, 2009 Acute visit. Pt c/o persistant cough "for over 2 yrs"- last seen here in Oct 2010.  Cough is prod with yellow sputum and is worse at bedtime.  He also c/o runny nose. improves sev weeks to maybe a month after a cortisone  Current Medications (verified): 1)  Omeprazole 20 Mg Cpdr (Omeprazole) .Nathaniel Hartman.. 1 Once Daily 2)  Proair Hfa 108 (90 Base) Mcg/act Aers (Albuterol Sulfate) .... 2 Puffs Every 4-6 Hrs As Needed 3)  Tussionex Pennkinetic Er 8-10 Mg/81ml Lqcr (Chlorpheniramine-Hydrocodone) .Nathaniel Hartman.. 1 Tsp At Bedtime As Needed 4)  Qvar 80 Mcg/act Aers (Beclomethasone Dipropionate) .... 2 Puffs Two Times A Day 5)  Singulair 10 Mg Tabs (Montelukast Sodium) .Nathaniel Hartman.. 1 Once Daily  Allergies (verified): No Known Drug Allergies  Past History:  Past Medical History: 1) pneumonia bilateral, diagnosed on July 31, 2007.    2) CT negative for PE - June 2009 3) VDRF -sept 2009. Admitted for 4 days. Intubated for 1 day 4) [facility] Negative [facility] sept [facility]  ASTHMA -        Spirometry 11/06/2007 -> normal while on symbicort      based on decompensation off inhaled steroids in Oct 2009 and normalization of symptoms when symbicort     restarted >Spirometry 05/31/2008: fev1 2.8L/65%, FVC 3.94L/74%, Ratio 71 (90), Fef 25-75%: 1.9L/44% >Spirometry 06/21/2008:  Fev1 2.76L/64%, FVC 3.84L/72%, Ratio 72 (91). -cont advair hfa >spiro 09/05/2008: Fev1 2.82L/76%, FVC 4.05L/77%, Ratio 70 (70) - cont advair 230 hfa, start singualr > d/c September 13, 2009 as cough no better Chronic cough ? etiology     - no better on qvar and  singulair both stopped September 13, 2009      - Sinus CT ordered September 13, 2009 >>>  Past Pulmonary History:  Pulmonary History: 1) pneumonia bilateral, diagnosed on July 31, 2007.  He was treated with azithromycin. Seen on CT chest (personally reviewed 11/06/2007) 2) CT negative for PE - June 2009 3) VDRF -sept 2009. Admitted for 4 days. Intubated for 1 day 4) HIV Negative - sept 2009 [facility])  COPD/ASTHMA -  >COPD based on prior smoking and cxr changes and june/sept 2009 admits for acute resp failure NOS.  > Darrold Junker has had spirometry till 11/06/2007.  > Spirometry 11/06/2007 -> normal while on symbicort > ASTHMA - diagnosed 12/15/2007 by Dr. Shelle Iron- based on decompensation off inhaled steroids in Oct 2009 and normalization of symptoms when symbicort restarted >Spirometry 05/31/2008: fev1 2.8L/65%, FVC 3.94L/74%, Ratio 71 (  90), Fef 25-75%: 1.9L/44% >Spirometry 06/21/2008:  Fev1 2.76L/64%, FVC 3.84L/72%, Ratio 72 (91). -cont advair hfa >spiro 09/05/2008: Fev1 2.82L/76%, FVC 4.05L/77%, Ratio 70 (70) - cont advair 230 hfa, start singualr  Vital Signs:  Patient profile:   60 year old male Weight:      174 pounds BMI:     22.42 O2 Sat:      97 % on Room air Temp:     97.9 degrees F oral Pulse rate:   68 / minute BP sitting:   140 / 94  (left arm)  Vitals Entered By: Vernie Murders (September 13, 2009 9:23 AM)  O2 Flow:  Room air  Physical Exam  Additional Exam:  amb vietnamese male limited English, nad  HEENT:  [place]/AT, , EACs-clear, TMs-wnl, NOSE-pale , THROAT-clear, no exudate NECK:  Supple w/ fair ROM; no JVD; normal carotid impulses w/o bruits; no thyromegaly or nodules palpated; no lymphadenopathy. RESP  Clear to P & A; w/o, wheezes/ rales/ or rhonchi. CARD:  RRR, no m/r/g   GI:   Soft & nt; nml bowel sounds; no organomegaly or masses detected. Musco: Warm bil,  no calf tenderness edema, clubbing, pulses intact Neuro: intact w/ no focal deficits.    CXR  Procedure date:   09/13/2009  Findings:      Comparison: Chest x-ray of 08/03/2009   Findings: The lungs remain hyperaerated.  Minimally prominent markings are noted on the lateral view in the the region of an anterior basal portion of a lower lobe.  This may represent atelectasis but early pneumonia cannot be excluded.  Mild peribronchial thickening is present.  The heart is within normal limits in size and mediastinal contours are stable.  No bony abnormality is seen.   IMPRESSION:   1.  Hyperaeration.  Probable COPD. 2.  Slightly prominent markings in an anterior basal segment of a lower lobe on the lateral view only.  Atelectasis or pneumonia. Consider follow-up.  Impression & Recommendations:  Problem # 1:  COUGH (ICD-786.2) The most common causes of chronic cough in immunocompetent adults include: upper airway cough syndrome (UACS), previously referred to as postnasal drip syndrome,  caused by variety of rhinosinus conditions; (2) asthma; (3) GERD; (4) chronic bronchitis from cigarette smoking or other inhaled environmental irritants; (5) nonasthmatic eosinophilic bronchitis; and (6) bronchiectasis. These conditions, singly or in combination, have accounted for up to 94% of the causes of chronic cough in prospective studies.   Whatever this is gets much better on prednisone and much worse off it raising the question of whether using ICS consistently correctly or this is eosinophilic bronchitis or rhinitis:  for now, try off ics since breathing fine and all ics can contribute to cough  Proceed with sinus w/u  Max rx for gerd  Ext discussion with pt and son: The standardized cough guidelines recently published in Chest are a 14 step process, not a single office visit,  and are intended  to address this problem logically,  with an alogrithm dependent on response to each progressive step  to determine a specific diagnosis with  minimal addtional testing needed. Therefore if compliance is an issue this  empiric standardized approach simply won't work.   Problem # 2:  ASTHMA (ICD-493.90) PFT's are nl at baseline and no doe or need for rescue rx so it may be that this is not cough variant asthma at all.  I spent extra time with the patient today explaining optimal mdi  technique.  This improved from  50-75% with coaching  I discussed the importance of understanding the goals of asthma therapy including the rule of 2's as it applies to the use of a rescue inhaler.    Each maintenance medication was reviewed in detail including most importantly the difference between maintenance and as needed and under what circumstances the prns are to be used. Adherence would appear to be a major obstacle here and will need son's help to assure this given language barrier   Medications Added to Medication List This Visit: 1)  Omeprazole 20 Mg Cpdr (Omeprazole) .Nathaniel Hartman.. 1 once daily 2)  Omeprazole 20 Mg Cpdr (Omeprazole) .... Take  one 30-60 min before first meal of the day 3)  Pepcid Ac Maximum Strength 20 Mg Tabs (Famotidine) .... One at bedtime 4)  Tussionex Pennkinetic Er 8-10 Mg/56ml Lqcr (Chlorpheniramine-hydrocodone) .Nathaniel Hartman.. 1 tsp at bedtime as needed 5)  Qvar 80 Mcg/act Aers (Beclomethasone dipropionate) .... 2 puffs two times a day 6)  Singulair 10 Mg Tabs (Montelukast sodium) .Nathaniel Hartman.. 1 once daily 7)  Proair Hfa 108 (90 Base) Mcg/act Aers (Albuterol sulfate) .... 2 puffs every 4-6 hrs as needed 8)  Proair Hfa 108 (90 Base) Mcg/act Aers (Albuterol sulfate) .... 2 puffs every 4-6 hrs as needed if short of breath 9)  Prednisone 10 Mg Tabs (Prednisone) .... 4 each am x 2days, 2x2days, 1x2days and stop  Other Orders: Misc. Referral (Misc. Ref) T-2 View CXR (71020TC) Est. Patient Level IV ([address]) HFA Instruction [phone])  Patient Instructions: 1)  Omeprazole should be taken Take  one 30-60 min before first meal of the day and pepcid ac 20 mg one at bedtime 2)  GERD (REFLUX)  is a common cause of respiratory  symptoms. It commonly presents without heartburn and can be treated with medication, but also with lifestyle changes including avoidance of late meals, excessive alcohol, smoking cessation, and avoid fatty foods, chocolate, peppermint, colas, red wine, and acidic juices such as orange juice. NO MINT OR MENTHOL PRODUCTS SO NO COUGH DROPS  3)  USE SUGARLESS CANDY INSTEAD (jolley ranchers)  4)  NO OIL BASED VITAMINS 5)  See Patient Care Coordinator before leaving for sinus ct 6)  only use proaire if short of breath 7)  stop singulair and qvar as they have not been effective 8)  Prednisone 4 each am x 2days, 2x2days, 1x2days and stop  9)  Please schedule a follow-up appointment in 2 weeks, sooner if needed  10)  Unlike when you get a prescription for eyeglasses, it's not possible to always walk out of this or any medical office with a perfect prescription that is immediately effective  based on any test that we offer here.  On the contrary, it may take several weeks for the full impact of changes recommened today - hopefully you will respond well.  If not, then we'll adjust your medication on your next visit accordingly, knowing more then than we can possibly know now.    Prescriptions: PREDNISONE 10 MG  TABS (PREDNISONE) 4 each am x 2days, 2x2days, 1x2days and stop  #14 x 0   Entered and Authorized by:   Nyoka Cowden MD   Signed by:   Nyoka Cowden MD on 09/13/2009   Method used:   Electronically to        [facility] (retail)       [address]       [place], [place]  [address]  Ph: [phone]       Fax: [phone]   RxID:   [id number]

## 2010-03-15 NOTE — Assessment & Plan Note (Signed)
Summary: Pulmonary/ f/u ov with hfa 100% and sinus dz   Visit Type:  Follow-up Primary Provider/Referring Provider:  Dr. Janace Hoard  CC:  Cough follow-up. the patient says his cough is better but not gone. Still has some chest congestion and sob at times..  History of Present Illness:  11/06/2007: p hosp f/u ov:  60 year old male with hx of COPD. Was admitted 10/22/2007 with acute resp failure and hypoxemia (abg 7.34/43/112 ? post intubaiton) with cxr showing copd but without infiltrates. D-dimer slightly high at 0.86 but not evaluated furhter in context of june 2009 when  d-dimer >20/ ct-angio being negative for pe. BNP <100 at admit. Treated for AE-COPD antibiotics and steroids. Extubated on 9/11 and discharged 9/14 on symbicort and prednisone taper. Feels well.    Spirometry and walking desat test are normal. Finishing pred taper 9/25  REC: taper symbicort to LABA alone/foradil  OV 11/30/2007 Urgent basis Dr. Shelle Iron. Severe cough with near cough syncope., chest soreness and increased sob. Diffuse wheeze on exam. No spiro done. Asthma exacerbation clinically suspected due to worsning symptoms following med taper REC: restart symbicort   OV 12/15/2007:  Feels well afer going back on symbicort. Using albuterol <2t/week for rescue. But taking tussionex daily once for cough suppression but he does not think he needs it.  REC: stop tussionex, take albuterol as needed, take symbicort scheduled, attend asthma ed   OV 05/31/2008: Scheduled followup of asthma/copd. Poor historian v [employer]. Since last OV, he went to [facility] on 3/9 with ae-asthma and treated with prednisone. He had initial improvement but  re-presented for followup 05/24/2008 with recurrence of cough. IIn addition, was having  ? new yellow sputum. -> started on pred taper (ov day = 7/7) on 4/14 for clinical dx of mild AE-COPD/ASthma. A CXR per report review showed  RML infiltrate. Since then, he says he is NO better. SPIRO today - Fev1  2.8L/65%, FVC 3.9L/74%, Ratio 71.  CXR - clear.  REC: Rx doxy x 6 days, change doxy -> advair hfa, restart 2 week pred taper  OV 06/21/2008: NEW THRUSH - Treated and taught to improve hfa technique. Ashtma itself VERY WELL CONTROLLED. Spirometry today is unchanged although symptoms are very well controlled. Fev1 today is 2.76L/64%, FVC 3.84L/72%, Ratio 72 (91). REcommened to continue advair hfa 2 puff two times a day   OV 09/05/2008. Followup asthma. Here with 2 sons who are acting as interpreter. Says was doing well till 1 month ago. He feels summer heat has made asthma worse. Son notices he is coughing a lot esp at night and in the heat. Lot of sneezing, sinus drainage and sOme associated yellow sputum and wheezing too. Albuterol helps. He  has used albuterol only 1-2 times this past week but in talking to him it appears he is more symptomatic than that.    October 11, 2008 with NP Tammy--Presents for 1 month folllow up - was given nasocort and singulair but dint help.  states cough is unchanged. Accompanied by son as interpretor. Describes minimally productive, mainly dry cough worse at night. c/o tickle in throat, drainage.  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 -no significant cough at work.   November 08, 2008 wit NP Tammy --Pt here to the office today with persistent intermittently cough.  Last visit treated w/   Prednisone taper, cough/rhinitis prevention. Finished Prednisone and has been taken Delsym for a week but the cough didn't go away.  C/o  cough more at night .  Has intermitently cough up min. amount of phlegm thin yellow in color.  still taking occ tussionex. Pt denies any significant sore throat, dysphagia, itching, sneezing,  nasal congestion or excess secretions,  fever, chills, sweats, unintended wt loss, pleuritic or exertional cp, hempoptysis, change in activity tolerance  orthopnea pnd or leg swelling.  No need for albuterol at allOV 12/05/2008. Followup ASthma/COPD and  chronic nocturnal cough Started him on singulair back then but   has no idea if he is taking it. Since then he saw NP TammyParrett once in August and once in September and her notes do not mention singulair. WE called [facility] and was told he only filled it once on 09/05/2008. Overall doing well. Taking advair hfa 2 puff two times a day, diligiently and albuterol as needed  only. Has not used albuterol in >2 months but is still c/o persistent nocturanl cough. He got pred taper in August and in syptomatic Rx in Sept visits including PPD but he did not come back for read. Cough persists.   C/o cough more at night .  Has intermitently cough up min. amount of phlegm thin yellow in color. rec qvar  September 13, 2009 Acute visit. Pt c/o persistant cough "for over 2 yrs"- last seen here in Oct 2010.  Cough is prod with yellow sputum and is worse at bedtime.  He also c/o runny nose. improves sev weeks to maybe a month after a cortisone.  rec sinus ct > pos sinusitis rx augmentin x 10-20 days, only 10 because better and maintained acid suppression.  September 27, 2009 Cough follow-up. the patient says his cough is better but not gone. Still has some chest congestion and sob at times esp in am.   Current Medications (verified): 1)  Omeprazole 20 Mg Cpdr (Omeprazole) .... Take  One 30-60 Min Before First Meal of The Day 2)  Pepcid Ac Maximum Strength 20 Mg Tabs (Famotidine) .... One At Bedtime 3)  Tussionex Pennkinetic Er 8-10 Mg/27ml Lqcr (Chlorpheniramine-Hydrocodone) .Marland Kitchen.. 1 Tsp At Bedtime As Needed 4)  Proair Hfa 108 (90 Base) Mcg/act Aers (Albuterol Sulfate) .... 2 Puffs Every 4-6 Hrs As Needed If Short of Breath  Allergies (verified): No Known Drug Allergies  Past History:  Past Medical History: 1) pneumonia bilateral, diagnosed on July 31, 2007.    2) CT negative for PE - June 2009 3) VDRF -sept 2009. Admitted for 4 days. Intubated for 1 day 4) HIV Negative - sept 2009 [facility]  ASTHMA -      - HFA 100%  effective tecnique September 27, 2009        Spirometry 11/06/2007 -> normal while on symbicort      based on decompensation off inhaled steroids in Oct 2009 and normalization of symptoms when symbicort     restarted >Spirometry 05/31/2008: fev1 2.8L/65%, FVC 3.94L/74%, Ratio 71 (90), Fef 25-75%: 1.9L/44% >Spirometry 06/21/2008:  Fev1 2.76L/64%, FVC 3.84L/72%, Ratio 72 (91). -cont advair hfa >spiro 09/05/2008: Fev1 2.82L/76%, FVC 4.05L/77%, Ratio 70 (70) - cont advair 230 hfa, start singualr > d/c September 13, 2009 as cough no better Chronic cough ? etiology     - no better on qvar and singulair both stopped September 13, 2009 > no worse cough September 27, 2009      - Sinus CT 09/14/09 > pansinusitis:  Rec augmentin 20 days then repeat sinus ct if not 100% better  Past Pulmonary History:  Pulmonary History: 1)  pneumonia bilateral, diagnosed on July 31, 2007.  He was treated with azithromycin. Seen on CT chest (personally reviewed 11/06/2007) 2) CT negative for PE - June 2009 3) VDRF -sept 2009. Admitted for 4 days. Intubated for 1 day 4) HIV Negative - sept 2009 [facility])  COPD/ASTHMA -  >COPD based on prior smoking and cxr changes and june/sept 2009 admits for acute resp failure NOS.  > Darrold Junker has had spirometry till 11/06/2007.  > Spirometry 11/06/2007 -> normal while on symbicort > ASTHMA - diagnosed 12/15/2007 by Dr. Shelle Iron- based on decompensation off inhaled steroids in Oct 2009 and normalization of symptoms when symbicort restarted >Spirometry 05/31/2008: fev1 2.8L/65%, FVC 3.94L/74%, Ratio 71 (90), Fef 25-75%: 1.9L/44% >Spirometry 06/21/2008:  Fev1 2.76L/64%, FVC 3.84L/72%, Ratio 72 (91). -cont advair hfa >spiro 09/05/2008: Fev1 2.82L/76%, FVC 4.05L/77%, Ratio 70 (70) - cont advair 230 hfa, start singualr  Vital Signs:  Patient profile:   60 year old male Height:      74 inches (187.96 cm) Weight:      175.50 pounds (79.77 kg) BMI:     22.61 O2 Sat:      96 % on Room air Temp:     98.0 degrees F  (36.67 degrees C) oral Pulse rate:   79 / minute BP sitting:   124 / 90  (left arm) Cuff size:   regular  Vitals Entered By: Michel Bickers CMA (September 27, 2009 9:42 AM)  O2 Sat at Rest %:  96 O2 Flow:  Room air CC: Cough follow-up. the patient says his cough is better but not gone. Still has some chest congestion and sob at times. Comments Medications reviewed. Daytime phone verified. Michel Bickers CMA  September 27, 2009 9:42 AM   Physical Exam  Additional Exam:  amb vietnamese male limited English, nad  wt 174 > 175  September 27, 2009  HEENT:  [place]/AT, , EACs-clear, TMs-wnl, NOSE-pale , THROAT-clear, no exudate NECK:  Supple w/ fair ROM; no JVD; normal carotid impulses w/o bruits; no thyromegaly or nodules palpated; no lymphadenopathy. RESP  Clear to P & A; w/o, wheezes/ rales/ or rhonchi. CARD:  RRR, no m/r/g   GI:   Soft & nt; nml bowel sounds; no organomegaly or masses detected. [place]co: Warm bil,  no calf tenderness edema, clubbing, pulses intact Neuro: intact w/ no focal deficits.    Impression & Recommendations:  Problem # 1:  COUGH (ICD-786.2)  Clearly better after rx for sinusitis.  for now continue max gerd rx and make sure sinusitis is adequately addressed   See instructions for specific recommendations   Orders: Est. Patient Level IV ([address])  Problem # 2:  ASTHMA (ICD-493.90) All goals of asthma met including optimal function and elimination of symptoms with minimum(no) need for rescue therapy. Contingencies discussed today including the rule of two's.   I spent extra time with the patient today explaining optimal mdi  technique.  This improved from  75-90% p coaching  Patient Instructions: 1)  Omeprazole should be taken Take  one 30-60 min before first meal of the day and pepcid ac 20 mg one at bedtime 2)  GERD (REFLUX)  is a common cause of respiratory symptoms. It commonly presents without heartburn and can be treated with medication, but also with lifestyle changes  including avoidance of late meals, excessive alcohol, smoking cessation, and avoid fatty foods, chocolate, peppermint, colas, red wine, and acidic juices such as orange juice. NO MINT OR MENTHOL PRODUCTS SO NO COUGH DROPS  3)  USE SUGARLESS CANDY INSTEAD (jolley ranchers)  4)  NO OIL BASED VITAMINS 5)  only use proaire if short of breath 6)  Take augmentin  875 x another 10 days and if not better to point where all that's needed is robitussin dm then need a repeat CT of sinus and call [phone] and ask for [facility]

## 2010-03-15 NOTE — Progress Notes (Signed)
Summary: Reviewed rx with son  Phone Note Call from Patient Call back at Home Phone [phone]   Caller: Son Call For: wert Summary of Call: pt's son requests to speak to dr wert re: pt (who was seen yesterday). he would like either an extended call or will come in for appt (either with or without his fatther. [phone] Initial call taken by: Tivis Ringer, CNA,  November 01, 2009 8:53 AM  Follow-up for Phone Call        Pt son Christen Bame was unable to come to OV yesterday with pt and wanted to let MW know what was happening with the pt at home. He staes his father has been unable to sleep because of nasal congestion and drainage, which causes him to cough so much that he gets SOB. He has been unable to go to work regularly because he is not getting any sleep . Christen Bame states that MW mentioned sending the pt to a sinus Careers adviser. I advised that in ov note MW was rec that pt finish augmentin course and then they would possibly repaet CT of sinus and depending on the results might refer pt to ENT. Pt son states they have already seen an ENT in the past and was told everything was fine. Pt son is wanting to know if they can go ahead and be referred for sinus surgery eval instead of waiting because pt is so miserable. Please advise. Carron Curie CMA  November 01, 2009 9:40 AM Afrin 2 x 5 days each nostril keep appt for sinus ct then re-eval on a Wednesday when we can get back together and overcome the language barrier together  Follow-up by: Nyoka Cowden MD,  November 01, 2009 10:03 AM    New/Updated Medications: ATROVENT 0.03 % SOLN (IPRATROPIUM BROMIDE) one every 6 hours as needed for short of breath  Appended Document: Reviewed rx with son [id number]  Appended Document: Reviewed rx with son Dr. Sherene Sires spoke with the pt son. Nothing further needed.

## 2010-03-15 NOTE — Progress Notes (Signed)
Summary: cough questions regarding prednisone-  Phone Note Call from Patient Call back at Home Phone [phone]   Caller: Son/RON Call For: DR. Sherene Sires Summary of Call: Patients son Ron called his dad is beginning to cough again he wants to know if there is anything that he can do like increasing his prednisone or if he needs to be seen. The cough is dry it is more in the morning and at night.  Dr. Sherene Sires cut his prednisone to 1/2 pill every other day. He wants to know if they should do a whole once a day or 1/2 once a day. Ron can be reached at [phone] before 11 or [phone] after 11. Initial call taken by: Vedia Coffer,  February 09, 2010 9:27 AM  Follow-up for Phone Call        Spoke with pt son Ron and he states pt cough has returned. It is a dry cough that is worse first thing in the moring and during the night.  Pt was recently cut back to 5mg  prednisone every other day at 01-10-10 ov with MW.  Pt son states pt did ok on lower dose at first but now cough is returning. Son wants to know if they need to increased the prednsione? Please advsie. Carron Curie CMA  February 09, 2010 9:43 AM   Additional Follow-up for Phone Call Additional follow up Details #1::        May increase to 10mg  once daily for 1 week, then 1/2 tab daily and hold. then ov w/ Dr. Sherene Sires  Please contact office for sooner follow up if symptoms do not improve or worsen  Additional Follow-up by: Rubye Oaks NP,  February 09, 2010 9:57 AM    Additional Follow-up for Phone Call Additional follow up Details #2::    [id number]. Carron Curie CMA  February 09, 2010 10:17 AM  pt son advised of recs. pt has appt with MW on 02-28-10. Carron Curie CMA  February 13, 2010 5:09 PM   New/Updated Medications: PREDNISONE 10 MG  TABS (PREDNISONE) Take 1/2 tablet by mouth once a day

## 2010-03-15 NOTE — Progress Notes (Signed)
Summary: returned call---results of CT  Phone Note Call from Patient Call back at Home Phone [phone]   Caller: Son- ron Sigala Call For: wert Summary of Call: returned call from nurse re: results Initial call taken by: Tivis Ringer, CNA,  November 15, 2009 2:47 PM  Follow-up for Phone Call        called and spoke with pt's son. son aware of CT results.  Son verbalized understanding and stated he will relay message to pt.  Aundra Millet Reynolds LPN  November 15, 2009 3:25 PM

## 2010-03-15 NOTE — Discharge Summary (Addendum)
NAME:  Nathaniel Hartman, Nathaniel Hartman NO.:  [id number]  MEDICAL RECORD NO.:  [id number]          PATIENT TYPE:  INP  LOCATION:  4713                         FACILITY:  [facility]  PHYSICIAN:  Coralyn Helling, MD        DATE OF BIRTH:  [birth year]  DATE OF ADMISSION:  03/08/2010 DATE OF DISCHARGE:  03/12/2010                              DISCHARGE SUMMARY   DISCHARGE DIAGNOSES: 1. Acute respiratory failure. 2. Hypertension.  HISTORY OF PRESENT ILLNESS:  [name] is a 60 year old male with history of COPD, prednisone dependent who presented to the emergency room on March 08, 2010.  EMS was called for complaints of worsening shortness of breath and vomiting.  According to the patient's family on arrival, the patient had not had any fever, diarrhea, or chest pain. However, he had been having increased shortness of breath and intractable vomiting approximately x1 day.  In the emergency room, the patient was found to be unresponsive and was intubated for respiratory failure and airway protection.  Initial ABG showed a pH of 7.0 and pCO2 of 114.  LABORATORY DATA:  At the time of admission on March 08, 2010, arterial blood gas, pH 7.02, pCO2 of 114, pO2 of 65, and bicarb 29.6.  Basic metabolic panel, sodium 139, potassium 3.6, glucose 224, BUN 22, creatinine 1.5.  CBC, white blood cells 12.4, hemoglobin 14.1, hematocrit 43.5, platelets 199.  Lactic acid 2.3.  BNP 32.  Alcohol level less than 5.  Most recent laboratory data on March 12, 2010, basic metabolic panel, sodium 139, potassium 3.9, glucose 94, BUN 25, creatinine 1.19.  Micro data: Urine Legionella was negative.  Urine culture from March 08, 2010, negative.  Blood cultures x2 from March 08, 2010, preliminary report is negative.  Respiratory culture from March 08, 2010, shows normal flora.  RADIOLOGY DATA:  At the time of admission on March 08, 2010, portable chest x-ray shows hyperexpansion of lungs and  questionable pulmonary vascular congestion.  CT of the head on March 08, 2010, shows chronic sinusitis and no acute intracranial abnormality.  March 10, 2010, portable chest x-ray shows mild basilar atelectasis, and no evidence of pulmonary edema.  HOSPITAL COURSE BY DISCHARGE DIAGNOSES: 1. Acute respiratory failure.  This was multifactorial in the setting     of likely acute exacerbation of COPD as well as probable aspiration     plus or minus a underlying viral infection.  The patient did     require intubation on arrival and was intubated from March 08, 2010, through March 09, 2010.  He was extubated quickly and     required no further ventilatory support.  He was treated with IV     steroids, nebulized bronchodilators, and IV antibiotics.  At the     time of discharge, the patient is currently off all antibiotics,     remained afebrile.  White blood cell count continues to trend down.     He has been transitioned to p.o. steroids and is tolerating room     air.  The patient's respiratory status appears to be back to his  baseline.  On discharge, the patient will be continued on a     prednisone taper back to his normal 10 mg and will follow up on an     outpatient basis with Rubye Oaks, nurse practitioner as well as     Dr. Sherene Sires.  The patient has had no further vomiting and appears to     have no issues with dysphagia.  His questionable aspiration was     strictly in the setting of acute vomiting. 2. Hypertension.  The patient has no history of hypertension, but was     found to be in hypertensive crisis on arrival with blood pressure     of 228/145 in this setting of respiratory distress and likely     illness.  The patient has been started on amlodipine 5 mg by mouth     during this admission.  This will be continued on discharge, and     the patient will follow up with Rubye Oaks, nurse practitioner     for further evaluation of need to continue with any      antihypertensives.  DISCHARGE MEDICATIONS: 1. Prednisone 10 mg tablets.  The patient is to take 4 tablets daily     x3 days, then 3 tablets daily x3 days, then 2 tablets daily x3     days, and then return to his normal 110 mg tablet daily. 2. Amlodipine 5 mg p.o. daily. 3. Benadryl 25 mg p.o. daily as needed for allergies. 4. Pepcid 20 mg p.o. at bedtime. 5. Albuterol 2 puffs inhaled q.4 h. p.r.n. 6. Symbicort 160/4.5 two puffs inhaled b.i.d. 7. Tramadol 50 mg 1-2 tablets p.o. q.4 h. p.r.n. for excessive cough. 8. Zyrtec 10 mg p.o. at bedtime p.r.n. for allergies.  DISCHARGE FOLLOWUP:  The patient is to follow up with Rubye Oaks, nurse practitioner in the Pulmonary office on March 27, 2010, at 4:00 p.m.  DISCHARGE ACTIVITY:  No restrictions.  DISCHARGE DIET:  Low-sodium, heart-healthy.  DISPOSITION:  The patient has met maximum benefit from his inpatient hospitalization.  His respiratory status is back to baseline.  He is medically cleared and ready for discharge to home with further followup by primary care as well as Pulmonary office.     Dirk Dress, NP   ______________________________ Coralyn Helling, MD    KW/MEDQ  D:  03/12/2010  T:  03/13/2010  Job:  [id number]  cc:   Elvina Sidle, M.D. Charlaine Dalton. Sherene Sires, MD, [facility]  Electronically Signed by Danford Bad N.P. on 03/14/2010 02:36:29 PM Electronically Signed by Coralyn Helling MD on 03/15/2010 09:26:48 AM

## 2010-03-15 NOTE — Assessment & Plan Note (Signed)
Summary: NP follow up - med calendar   Primary Provider/Referring Provider:  Dr. Janace Hoard  CC:  new med calendar - pt brought all meds with him today.  since beginning the tramadol and states has been itching at night - takes benadryl for this.  History of Present Illness: 60 yo vi[place]namese male  quit smoking 1999 with AB vs  COPD. Was admitted 10/22/2007    AE-COPD antibiotics and steroids. Extubated on 9/11 and discharged 9/14 on symbicort and prednisone taper. Feels well.    Spirometry and walking desat test are normal. Finishing pred taper 9/25  REC: taper symbicort to LABA alone/foradil  OV 11/30/2007 Urgent basis Dr. Shelle Iron. Severe cough with near cough syncope., chest soreness and increased sob. Diffuse wheeze on exam. No spiro done. Asthma exacerbation clinically suspected due to worsning symptoms following med taper REC: restart symbicort   OV 12/15/2007:  Feels well afer going back on symbicort. Using albuterol <2t/week for rescue. But taking tussionex daily once for cough suppression but he does not think he needs it.  REC: stop tussionex, take albuterol as needed, take symbicort scheduled   October 11, 2008 cc given nasocort and singulair but dint help.  states cough is unchanged.   September 13, 2009 cc cough "for over 2 yrs"- l .  Cough is prod with yellow sputum and is worse at bedtime.  He also c/o runny nose. improves sev weeks to maybe a month after a cortisone.  rec sinus ct > positive  sinusitis rx augmentin x 10-20 days, only took  10 because better and maintained acid suppression.  August 17, 2011cc cough is better but not gone. Still has some chest congestion and sob at times esp in am.   rec 10 days augmentin some better transiently  then worse again when finished so complete 10 more days and then do ct sinus > never had it.  October 25, 2009 ov cc SOB is worse with exertion and at rest...cough is more prod with yellow to green mucus...not sleeping well  worse x 1 week  despite maintaining gerd rx.   Marked increase in saba, both hfa and neb which we were not aware he had per our med record. rec 21 days augmentin then ct sinus.   see page 2    October 31, 2009 ov  c/o worsening cough x 3 days- prod with white to orange colored sputum.  He states that cough gets worse at night with wheezing and has been unable to sleep for the past 3 nights. requested to bring all meds, brought empty proaire and no neb solutions and not sure what he's putting in machine.    doesn't think any of his meds are working any more, even transiently, though not sure about this hx as didn't bring fm member>> rx pred 20mg  ceiliing/10mg  floor  November 14, 2009 --Presents for follow up and med review.  His son is with him today and helps with his meds. We reviewed his meds and organized them into a med calendar. Recent CT sinus showed acute sinusitis, currently on augmentin x 21 days. recent xray with increased atx on right. He has couple days of augmentin left. He is feeling better. Prednisone was increased last ov due increased cough. Cough and wheezing is better. Denies chest pain,  orthopnea, hemoptysis, fever, n/v/d, edema, headache.       Medications Prior to Update: 1)  Symbicort 160-4.5 Mcg/act  Aero (Budesonide-Formoterol Fumarate) .... 2 Puffs First Thing  in Am and 2 Puffs Again in Pm About 12 Hours Later 2)  Prilosec Otc 20 Mg Tbec (Omeprazole Magnesium) .... Take  One 30-60 Min Before First Meal of The Day 3)  Pepcid Ac Maximum Strength 20 Mg Tabs (Famotidine) .... One At Bedtime 4)  Prednisone 10 Mg  Tabs (Prednisone) .... 2 Daily Until Better, Then 1 Daily 5)  Proair Hfa 108 (90 Base) Mcg/act Aers (Albuterol Sulfate) .... 2 Puffs Every 4-6 Hrs As Needed If Short of Breath 6)  Mucinex Dm Maximum Strength 60-1200 Mg Xr12h-Tab (Dextromethorphan-Guaifenesin) .... One Twice As Needed For Cough 7)  Tramadol Hcl 50 Mg  Tabs (Tramadol Hcl) .... One To Two By Mouth Every 4-6 Hours If  Needed For Excess Cough 8)  Atrovent 0.03 % Soln (Ipratropium Bromide) .... One Every 6 Hours As Needed For Short of Breath 9)  Augmentin 875-125 Mg  Tabs (Amoxicillin-Pot Clavulanate) .... By Mouth Twice Daily With Bfast and Supper and Large Glass of Water  Current Medications (verified): 1)  Symbicort 160-4.5 Mcg/act  Aero (Budesonide-Formoterol Fumarate) .... 2 Puffs First Thing  in Am and 2 Puffs Again in Pm About 12 Hours Later 2)  Prilosec Otc 20 Mg Tbec (Omeprazole Magnesium) .... Take  One 30-60 Min Before First Meal of The Day 3)  Pepcid Ac Maximum Strength 20 Mg Tabs (Famotidine) .... One At Bedtime 4)  Prednisone 10 Mg  Tabs (Prednisone) .... Take 1 Tablet By Mouth Once A Day 5)  Proair Hfa 108 (90 Base) Mcg/act Aers (Albuterol Sulfate) .... 2 Puffs Every 4-6 Hrs As Needed If Short of Breath 6)  Mucinex Dm Maximum Strength 60-1200 Mg Xr12h-Tab (Dextromethorphan-Guaifenesin) .... Take 1-2 Tablets Every 12 Hours As Needed 7)  Tramadol Hcl 50 Mg  Tabs (Tramadol Hcl) .Marland Kitchen.. 1 Tab Every 4 Hours As Needed For Excessive Coughing 8)  Zyrtec Allergy 10 Mg Tabs (Cetirizine Hcl) .... Take 1 Tab By Mouth At Bedtime As Needed 9)  Augmentin 875-125 Mg  Tabs (Amoxicillin-Pot Clavulanate) .... By Mouth Twice Daily With Bfast and Supper and Large Glass of Water  Allergies (verified): No Known Drug Allergies  Past History:  Past Surgical History: Last updated: 11/06/2007 left hand  with possible tendon, artery, and nerve damage. - july 2008  Family History: Last updated: 10/11/2008 no DM no asthma no allergies no emphysema no hypercholestolemia no heart disease no clotting disorders no cancer  Social History: Last updated: 11/08/2008 [place]--moved to [place] in 1969.  no pets.  non-drinker, no drug abuse,  former smoker, quit smoking 1999, x53yrs, 1/2ppd no alcohol works metal plating--lots of fumes, wears mask.l   Risk Factors: Smoking Status: quit (11/06/2007)  Past Medical  History: 1) pneumonia bilateral, diagnosed on July 31, 2007.    2) CT negative for PE - June 2009 3) VDRF -sept 2009. Admitted for 4 days. Intubated for 1 day 4) HIV Negative - sept 2009 at [facility]  ASTHMA -      - HFA 100% effective tecnique September 27, 2009 > 75% October 31, 2009        Spirometry 11/06/2007 -> normal while on symbicort >Spirometry 05/31/2008: fev1 2.8L/65%, FVC 3.94L/74%, Ratio 71 (90), Fef 25-75%: 1.9L/44% >Spirometry 06/21/2008:  Fev1 2.76L/64%, FVC 3.84L/72%, Ratio 72 (91). -cont advair hfa >spiro 09/05/2008: Fev1 2.82L/76%, FVC 4.05L/77%, Ratio 70 (70) - cont advair 230 hfa, start singualr > d/c September 13, 2009 as cough no better on singulair Chronic cough ? etiology     - no better on  qvar and singulair both stopped September 13, 2009 > no worse cough September 27, 2009      - Sinus CT 09/14/09 > pansinusitis  > 21 days rx started  c augment October 25, 2009 >> repeat sinus ct:     - Daily prednisone rx October 31, 2009 >>> Health Maintenance      - Pneumovax given October 31, 2009  COMPLEX MED REGIMEN-Meds reviewed with pt education and computerized med calendar completed 11/14/09  Past Pulmonary History:  Pulmonary History: 1) pneumonia bilateral, diagnosed on July 31, 2007.  He was treated with azithromycin. Seen on CT chest (personally reviewed 11/06/2007) 2) CT negative for PE - June 2009 3) VDRF -sept 2009. Admitted for 4 days. Intubated for 1 day 4) HIV Negative - sept 2009 at [facility] 5)  COPD/ASTHMA -  >COPD based on prior smoking and cxr changes and june/sept 2009 admits for acute resp failure NOS.  > Darrold Junker has had spirometry till 11/06/2007.  > Spirometry 11/06/2007 -> normal while on symbicort > ASTHMA - diagnosed 12/15/2007 by Dr. Shelle Iron- based on decompensation off inhaled steroids in Oct 2009 and normalization of symptoms when symbicort restarted >Spirometry 05/31/2008: fev1 2.8L/65%, FVC 3.94L/74%, Ratio 71 (90), Fef 25-75%: 1.9L/44% >Spirometry 06/21/2008:  Fev1  2.76L/64%, FVC 3.84L/72%, Ratio 72 (91). -cont advair hfa >spiro 09/05/2008: Fev1 2.82L/76%, FVC 4.05L/77%, Ratio 70 (70) - cont advair 230 hfa, start singualr  Vital Signs:  Patient profile:   60 year old male Height:      74 inches Weight:      180.13 pounds BMI:     23.21 O2 Sat:      98 % on Room air Temp:     98.1 degrees F oral Pulse rate:   75 / minute BP sitting:   150 / 98  (left arm) Cuff size:   regular  Vitals Entered By: Boone Master CNA/MA (November 14, 2009 9:06 AM)  O2 Flow:  Room air CC: new med calendar - pt brought all meds with him today.  since beginning the tramadol, states has been itching at night - takes benadryl for this Is Patient Diabetic? No Comments Medications reviewed with patient Daytime contact number verified with patient. Boone Master CNA/MA  November 14, 2009 9:06 AM    Physical Exam  Additional Exam:  amb vietnamese male limited English, nad  wt 174 > 175  September 27, 2009  > 170 October 25, 2009  > 169 October 31, 2009 >>180 11/14/09  HEENT:  [place]/AT, , EACs-clear, TMs-wnl, NOSE-pale , THROAT-clear, no exudate NECK:  Supple w/ fair ROM; no JVD; normal carotid impulses w/o bruits; no thyromegaly or nodules palpated; no lymphadenopathy. RESP   coarse BS with no wheezing  CARD:  RRR, no m/r/g   GI:   Soft & nt; nml bowel sounds; no organomegaly or masses detected. [place]: Warm bil,  no calf tenderness edema, clubbing, pulses intact  .    Impression & Recommendations:  Problem # 1:  C O P D (ICD-496) Slow to resolve exacerbation w/ cyclical cough and sinusitis.  slolwy improving w/ goal to wean down to off steroids. Plan:  Finish Augmentin -the antibiotic.  Change Benadryl to Zyrtec 10mg  at bedtime as needed drainage/itching.  Stop Atrovent neb.  Decrease Prednisone to 10mg  once daily and hold Follow med calendar closely and bring to each visit.  follow up Dr. Sherene Sires in 2 weeks  Please contact office for sooner follow up if symptoms do  not improve or worsen   Medications Added to Medication List This Visit: 1)  Prednisone 10 Mg Tabs (Prednisone) .... Take 1 tablet by mouth once a day 2)  Mucinex Dm Maximum Strength 60-1200 Mg Xr12h-tab (Dextromethorphan-guaifenesin) .... Take 1-2 tablets every 12 hours as needed 3)  Tramadol Hcl 50 Mg Tabs (Tramadol hcl) .Marland Kitchen.. 1 tab every 4 hours as needed for excessive coughing 4)  Zyrtec Allergy 10 Mg Tabs (Cetirizine hcl) .... Take 1 tab by mouth at bedtime as needed  Complete Medication List: 1)  Symbicort 160-4.5 Mcg/act Aero (Budesonide-formoterol fumarate) .... 2 puffs first thing  in am and 2 puffs again in pm about 12 hours later 2)  Prilosec Otc 20 Mg Tbec (Omeprazole magnesium) .... Take  one 30-60 min before first meal of the day 3)  Pepcid Ac Maximum Strength 20 Mg Tabs (Famotidine) .... One at bedtime 4)  Prednisone 10 Mg Tabs (Prednisone) .... Take 1 tablet by mouth once a day 5)  Proair Hfa 108 (90 Base) Mcg/act Aers (Albuterol sulfate) .... 2 puffs every 4-6 hrs as needed if short of breath 6)  Mucinex Dm Maximum Strength 60-1200 Mg Xr12h-tab (Dextromethorphan-guaifenesin) .... Take 1-2 tablets every 12 hours as needed 7)  Tramadol Hcl 50 Mg Tabs (Tramadol hcl) .Marland Kitchen.. 1 tab every 4 hours as needed for excessive coughing 8)  Zyrtec Allergy 10 Mg Tabs (Cetirizine hcl) .... Take 1 tab by mouth at bedtime as needed 9)  Augmentin 875-125 Mg Tabs (Amoxicillin-pot clavulanate) .... By mouth twice daily with bfast and supper and large glass of water  Other Orders: Est. Patient Level IV (72536)  Patient Instructions: 1)  Finish Augmentin -the antibiotic.  2)  Change Benadryl to Zyrtec 10mg  at bedtime as needed drainage/itching.  3)  Stop Atrovent neb.  4)  Decrease Prednisone to 10mg  once daily and hold 5)  Follow med calendar closely and bring to each visit.  6)  follow up Dr. Sherene Sires in 2 weeks  7)  Please contact office for sooner follow up if symptoms do not improve or worsen

## 2010-03-15 NOTE — Progress Notes (Signed)
Summary: nos appt  Phone Note Call from Patient   Caller: [email or web address]  Call For: wert Summary of Call: Rsc nos from 9/6 to 9/14 @ 10:50a. Initial call taken by: Darletta Moll,  October 18, 2009 2:37 PM

## 2010-03-15 NOTE — Assessment & Plan Note (Signed)
Summary: Pulmonary/ ext summary f/u ov hfa now 100%    Primary Nathaniel Hartman/Referring Erbie Arment:  Dr. Janace Hoard  CC:  Cough- almost resolved.  History of Present Illness: 60 yo vietnamese male  quit smoking 1999 with AB vs  COPD. Was admitted 10/22/2007 acute resp failure    AE-COPD antibiotics and steroids. Extubated on 9/11 and discharged 9/14 on symbicort and prednisone taper.  Spirometry and walking sats  normal when treated.    September 13, 2009 cc cough "for over 2 yrs"- l .  Cough is prod with yellow sputum and is worse at bedtime.  He also c/o runny nose. improves sev weeks to maybe a month after a cortisone.  rec sinus ct > positive  sinusitis rx augmentin x 10-20 days, only took  10 because better and maintained acid suppression.  August 17, 2011cc cough is better but not gone. Still has some chest congestion and sob at times esp in am.   rec 10 days augmentin some better transiently  then worse again when finished so complete 10 more days and then do ct sinus > never had it.  October 25, 2009 ov cc SOB is worse with exertion and at rest...cough is more prod with yellow to green mucus...not sleeping well  worse x 1 week despite maintaining gerd rx.   Marked increase in saba, both hfa and neb which we were not aware he had per our med record. rec 21 days augmentin then ct sinus     see page 2    October 31, 2009 ov  c/o worsening cough x 3 days- prod with white to orange colored sputum.  He states that cough gets worse at night with wheezing and has been unable to sleep for the past 3 nights. requested to bring all meds, brought empty proaire and no neb solutions and not sure what he's putting in machine.    doesn't think any of his meds are working any more, even transiently, though not sure about this hx as didn't bring fm member>> rx pred 20mg  ceiliing/10mg  floor  November 14, 2009 --Presents for follow up and med review.  His son is with him today and helps with his meds. We reviewed  his meds and organized them into a med calendar. Recent CT sinus showed acute sinusitis, currently on augmentin x 21 days. recent xray with increased atx on right. He has couple days of augmentin left. He is feeling better. Prednisone was increased last ov due increased cough. Cough and wheezing is better.  rec hold pred at 10 mg daily. Sinus ct repeated p rx  10/5 and resolved pansinusitis  November 29, 2009 ov cc cough, resolved, breathing fine.  using Calendar better with help of son.  rec try prednisone 10 mg one-half  January 10, 2010 ov cough much better not using proair, excellent technique with symbicort, on 5 mg per day. Pt denies any significant sore throat, dysphagia, itching, sneezing,  nasal congestion or excess secretions,  fever, chills, sweats, unintended wt loss, pleuritic or exertional cp, hempoptysis, change in activity tolerance  orthopnea pnd or leg swelling Pt also denies any obvious fluctuation in symptoms with weather or environmental change or other alleviating or aggravating factors.     Pt denies any increase in rescue therapy over baseline, denies waking up needing it or having early am exacerbations of coughing/wheezing/ or dyspnea     Current Medications (verified): 1)  Symbicort 160-4.5 Mcg/act  Aero (Budesonide-Formoterol Fumarate) .... 2 Puffs First  Thing  in Am and 2 Puffs Again in Pm About 12 Hours Later 2)  Prilosec Otc 20 Mg Tbec (Omeprazole Magnesium) .... Take  One 30-60 Min Before First Meal of The Day 3)  Pepcid Ac Maximum Strength 20 Mg Tabs (Famotidine) .... One At Bedtime 4)  Prednisone 10 Mg  Tabs (Prednisone) .... Take 1/2 Tablet By Mouth Once A Day 5)  Proair Hfa 108 (90 Base) Mcg/act Aers (Albuterol Sulfate) .... 2 Puffs Every 4-6 Hrs As Needed If Short of Breath 6)  Mucinex Dm Maximum Strength 60-1200 Mg Xr12h-Tab (Dextromethorphan-Guaifenesin) .... Take 1-2 Tablets Every 12 Hours As Needed 7)  Tramadol Hcl 50 Mg  Tabs (Tramadol Hcl) .Marland Kitchen.. 1 Tab Every 4  Hours As Needed For Excessive Coughing- Ran Out 8)  Zyrtec Allergy 10 Mg Tabs (Cetirizine Hcl) .... Take 1 Tab By Mouth At Bedtime As Needed- Not Taking  Allergies (verified): No Known Drug Allergies  Past History:  Past Medical History: 1) pneumonia bilateral, diagnosed on July 31, 2007.    2) CT negative for PE - June 2009 3) VDRF -sept 2009. Admitted for 4 days. Intubated for 1 day 4) HIV Negative - sept 2009 [facility] -      - HFA 100% effective tecnique September 27, 2009 > 75% October 31, 2009 > 90% November 29, 2009  Chronic cough ? etiology     - no better on qvar and singulair both stopped September 13, 2009 > no worse cough September 27, 2009      - Sinus CT 09/14/09 > pansinusitis       -  rx 21 days rx started  c augment October 25, 2009 >> repeat sinus ct:neg 11/15/09     - Daily prednisone rx October 31, 2009 >>> try every other day dosing January 10, 2010  Health Maintenance      - Pneumovax given October 31, 2009  COMPLEX MED REGIMEN-Meds reviewed with pt education and computerized med calendar completed 11/14/09  Past Pulmonary History:  Pulmonary History: 1) pneumonia bilateral, diagnosed on July 31, 2007.  He was treated with azithromycin. Seen on CT chest (personally reviewed 11/06/2007) 2) CT negative for PE - June 2009 3) VDRF -sept 2009. Admitted for 4 days. Intubated for 1 day 4) HIV Negative - sept 2009 at [facility])  COPD/ASTHMA -  >COPD based on prior smoking and cxr changes and june/sept 2009 admits for acute resp failure NOS.  > Darrold Junker has had spirometry till 11/06/2007.  > Spirometry 11/06/2007 -> normal while on symbicort > ASTHMA - diagnosed 12/15/2007 by Dr. Shelle Iron- based on decompensation off inhaled steroids in Oct 2009 and normalization of symptoms when symbicort restarted >Spirometry 05/31/2008: fev1 2.8L/65%, FVC 3.94L/74%, Ratio 71 (90), Fef 25-75%: 1.9L/44% >Spirometry 06/21/2008:  Fev1 2.76L/64%, FVC 3.84L/72%, Ratio 72 (91). -cont advair hfa >spiro  09/05/2008: Fev1 2.82L/76%, FVC 4.05L/77%, Ratio 70 (70) - cont advair 230 hfa, start singualr  Vital Signs:  Patient profile:   60 year old male Weight:      184 pounds O2 Sat:      97 % on Room air Temp:     97.7 degrees F oral Pulse rate:   75 / minute BP sitting:   140 / 96  (left arm)  Vitals Entered By: Vernie Murders (January 10, 2010 9:36 AM)  O2 Flow:  Room air  Physical Exam  Additional Exam:  amb vietnamese male limited English, nad  wt 175  September 27, 2009  >  170 October 25, 2009  >  180 11/14/09  > 180 November 29, 2009 > 184 January 10, 2010  HEENT:  [place]/AT, , EACs-clear, TMs-wnl, NOSE-pale , THROAT-clear, no exudate NECK:  Supple w/ fair ROM; no JVD; normal carotid impulses w/o bruits; no thyromegaly or nodules palpated; no lymphadenopathy. RESP  clear to A and P  CARD:  RRR, no m/r/g   GI:   Soft & nt; nml bowel sounds; no organomegaly or masses detected. Musco: Warm bil,  no calf tenderness edema, clubbing, pulses intact  .    Impression & Recommendations:  Problem # 1:  ASTHMA (ICD-493.90)  All goals of asthma met including optimal function and elimination of symptoms with minimum need for rescue therapy. Contingencies discussed today including the rule of two's, albeit on a complex regimen than contains prednisone.  Now that sinus dz addressed should be able to simplify rx  I spent extra time with the patient today explaining optimal mdi  technique.  Now 100% effective   Try to taper prednisone to 5 mg  every other day    Each maintenance medication was reviewed in detail including most importantly the difference between maintenance and as needed and under what circumstances the prns are to be used. This was done in the context of a medication calendar review which provided the patient with a user-friendly unambiguous mechanism for medication administration and reconciliation and provides an action plan for all active problems. It is critical that this be  shown to every doctor  for modification during the office visit if necessary so the patient can use it as a working document.   Medications Added to Medication List This Visit: 1)  Tramadol Hcl 50 Mg Tabs (Tramadol hcl) .Marland Kitchen.. 1 tab every 4 hours as needed for excessive coughing- ran out 2)  Zyrtec Allergy 10 Mg Tabs (Cetirizine hcl) .... Take 1 tab by mouth at bedtime as needed- not taking  Other Orders: Est. Patient Level IV (95188)  Patient Instructions: 1)  Try Prednisone 10 mg one half odd days only, take none on even days. 2)  Remember to keep proaire handy for as needed use 3)  Please schedule a follow-up appointment in 4 weeks, sooner if needed (after the holidays is fine)

## 2010-03-15 NOTE — Assessment & Plan Note (Signed)
Summary: Pulmonary/ ext f/u ov symbicort 160 and daily pred    Primary Provider/Referring Provider:  Dr. Janace Hoard  CC:  Acute visit.  Pt c/o worsening cough x 3 days- prod with white to orange colored sputum.  He states that cough gets worse at night with wheezing and has been unable to sleep for the past 3 nights..  History of Present Illness: 60 yo vietnamese male  quit smoking 1999 with AB vs  COPD. Was admitted 10/22/2007    AE-COPD antibiotics and steroids. Extubated on 9/11 and discharged 9/14 on symbicort and prednisone taper. Feels well.    Spirometry and walking desat test are normal. Finishing pred taper 9/25  REC: taper symbicort to LABA alone/foradil  OV 11/30/2007 Urgent basis Dr. Shelle Iron. Severe cough with near cough syncope., chest soreness and increased sob. Diffuse wheeze on exam. No spiro done. Asthma exacerbation clinically suspected due to worsning symptoms following med taper REC: restart symbicort   OV 12/15/2007:  Feels well afer going back on symbicort. Using albuterol <2t/week for rescue. But taking tussionex daily once for cough suppression but he does not think he needs it.  REC: stop tussionex, take albuterol as needed, take symbicort scheduled   October 11, 2008 cc given nasocort and singulair but dint help.  states cough is unchanged.   September 13, 2009 cc cough "for over 2 yrs"- l .  Cough is prod with yellow sputum and is worse at bedtime.  He also c/o runny nose. improves sev weeks to maybe a month after a cortisone.  rec sinus ct > positive  sinusitis rx augmentin x 10-20 days, only took  10 because better and maintained acid suppression.  August 17, 2011cc cough is better but not gone. Still has some chest congestion and sob at times esp in am.   rec 10 days augmentin some better transiently  then worse again when finished so complete 10 more days and then do ct sinus > never had it.  October 25, 2009 ov cc SOB is worse with exertion and at rest...cough is  more prod with yellow to green mucus...not sleeping well  worse x 1 week despite maintaining gerd rx.   Marked increase in saba, both hfa and neb which we were not aware he had per our med record. rec 21 days augmentin then ct sinus.   see page 2    October 31, 2009 ov  c/o worsening cough x 3 days- prod with white to orange colored sputum.  He states that cough gets worse at night with wheezing and has been unable to sleep for the past 3 nights. requested to bring all meds, brought empty proaire and no neb solutions and not sure what he's putting in machine.   Pt denies any significant sore throat, dysphagia, itching, sneezing,  nasal congestion or excess secretions,  fever, chills, sweats, unintended wt loss, pleuritic or exertional cp, hempoptysis, leg swelling.  Pt also denies any obvious fluctuation in symptoms with weather or environmental change or other alleviating or aggravating factors.     doesn't think any of his meds are working any more, even transiently, though not sure about this hx as didn't bring fm member             Current Medications (verified): 1)  Prilosec Otc 20 Mg Tbec (Omeprazole Magnesium) .Marland Kitchen.. 1 Every Am 2)  Pepcid Ac Maximum Strength 20 Mg Tabs (Famotidine) .... One At Bedtime 3)  Tramadol Hcl 50 Mg  Tabs (Tramadol  Hcl) .... One To Two By Mouth Every 4-6 Hours If Needed For Excess Cough 4)  Proair Hfa 108 (90 Base) Mcg/act Aers (Albuterol Sulfate) .... 2 Puffs Every 4-6 Hrs As Needed If Short of Breath 5)  Prednisone 10 Mg  Tabs (Prednisone) .... 4 Each Am X 2 Days,  3 X 2days, 2x2 Days, and 1x2 Days 6)  Augmentin 875-125 Mg  Tabs (Amoxicillin-Pot Clavulanate) .... By Mouth Twice Daily With Bfast and Supper and Large Glass of Water 7)  Mucinex Fast-Max Severe Cough/congestion Syrup .... As Directed Per Bottle As Needed  Allergies (verified): No Known Drug Allergies  Past History:  Past Medical History: 1) pneumonia bilateral, diagnosed on July 31, 2007.     2) CT negative for PE - June 2009 3) VDRF -sept 2009. Admitted for 4 days. Intubated for 1 day 4) HIV Negative - sept 2009 [facility]  ASTHMA -      - HFA 100% effective tecnique September 27, 2009 > 75% October 31, 2009        Spirometry 11/06/2007 -> normal while on symbicort >Spirometry 05/31/2008: fev1 2.8L/65%, FVC 3.94L/74%, Ratio 71 (90), Fef 25-75%: 1.9L/44% >Spirometry 06/21/2008:  Fev1 2.76L/64%, FVC 3.84L/72%, Ratio 72 (91). -cont advair hfa >spiro 09/05/2008: Fev1 2.82L/76%, FVC 4.05L/77%, Ratio 70 (70) - cont advair 230 hfa, start singualr > d/c September 13, 2009 as cough no better on singulair Chronic cough ? etiology     - no better on qvar and singulair both stopped September 13, 2009 > no worse cough September 27, 2009      - Sinus CT 09/14/09 > pansinusitis  > 21 days rx started  c augment October 25, 2009 >> repeat sinus ct:     - Daily prednisone rx October 31, 2009 >>> Health Maintenance      - Pneumovax given October 31, 2009   Past Pulmonary History:  Pulmonary History: 1) pneumonia bilateral, diagnosed on July 31, 2007.  He was treated with azithromycin. Seen on CT chest (personally reviewed 11/06/2007) 2) CT negative for PE - June 2009 3) VDRF -sept 2009. Admitted for 4 days. Intubated for 1 day 4) HIV Negative - sept 2009 [facility]  COPD/ASTHMA -  >COPD based on prior smoking and cxr changes and june/sept 2009 admits for acute resp failure NOS.  > Darrold Junker has had spirometry till 11/06/2007.  > Spirometry 11/06/2007 -> normal while on symbicort > ASTHMA - diagnosed 12/15/2007 by Dr. Shelle Iron- based on decompensation off inhaled steroids in Oct 2009 and normalization of symptoms when symbicort restarted >Spirometry 05/31/2008: fev1 2.8L/65%, FVC 3.94L/74%, Ratio 71 (90), Fef 25-75%: 1.9L/44% >Spirometry 06/21/2008:  Fev1 2.76L/64%, FVC 3.84L/72%, Ratio 72 (91). -cont advair hfa >spiro 09/05/2008: Fev1 2.82L/76%, FVC 4.05L/77%, Ratio 70 (70) - cont advair 230 hfa, start  singualr  Vital Signs:  Patient profile:   60 year old male Weight:      169.13 pounds O2 Sat:      90 % on Room air Temp:     98.1 degrees F oral Pulse rate:   71 / minute BP sitting:   112 / 76  (left arm)  Vitals Entered By: Vernie Murders (October 31, 2009 10:57 AM)  O2 Flow:  Room air  Physical Exam  Additional Exam:  amb vietnamese male limited English, nad  wt 174 > 175  September 27, 2009  > 170 October 25, 2009  > 169 October 31, 2009  HEENT:  [place]/AT, , EACs-clear, TMs-wnl, NOSE-pale ,  THROAT-clear, no exudate NECK:  Supple w/ fair ROM; no JVD; normal carotid impulses w/o bruits; no thyromegaly or nodules palpated; no lymphadenopathy. RESP   insp and exp rhonchi bilaterally CARD:  RRR, no m/r/g   GI:   Soft & nt; nml bowel sounds; no organomegaly or masses detected. Musco: Warm bil,  no calf tenderness edema, clubbing, pulses intact  .    Impression & Recommendations:  Problem # 1:  ASTHMA (ICD-493.90)  DDX of  difficult airways managment all start with A and  include Adherence, Ace Inhibitors, Acid Reflux, Active Sinus Disease, Alpha 1 Antitripsin deficiency, Anxiety masquerading as Airways dz,  ABPA,  allergy(esp in young), Aspiration (esp in elderly), Adverse effects of DPI,  Active smokers, plus one B  = Beta blocker use..    In this case Adherence is the biggest issue and starts with  inability to use HFA effectively and also  understand that SABA treats the symptoms but doesn't get to the underlying problem (inflammation).  Language is a major barrier.  I spent extra time with the patient today explaining optimal mdi  technique.  This improved from  50-75%  ? Active sinusitis:  ct sinus planned after 21 days augment >  ent refer if still active at that point  ? Acid reflux:  aggressively rx with ppi /h2 hs and diet.   See instructions for specific recommendations   Problem # 2:  COUGH (ICD-786.2) It may well be the cough and asthma are not directly related  and the challenge is finding an rx that addresses both to his satisfaction  Each maintenance medication was reviewed in detail including most importantly the difference between maintenance prns and under what circumstances the prns are to be used.  In addition, these two groups (for which the patient should keep up with refills) were distinguished from a third group :  meds that are used only short term with the intent to complete a course of therapy and then not refill them.  The med list was then fully reconciled and reorganized to reflect this important distinction. See instructions for specific recommendations   Medications Added to Medication List This Visit: 1)  Prilosec Otc 20 Mg Tbec (Omeprazole magnesium) .Marland Kitchen.. 1 every am 2)  Prilosec Otc 20 Mg Tbec (Omeprazole magnesium) .... Take  one 30-60 min before first meal of the day 3)  Symbicort 160-4.5 Mcg/act Aero (Budesonide-formoterol fumarate) .... 2 puffs first thing  in am and 2 puffs again in pm about 12 hours later 4)  Prednisone 10 Mg Tabs (Prednisone) .... 2 daily until better, then 1 daily 5)  Mucinex Fast-max Severe Cough/congestion Syrup  .... As directed per bottle as needed 6)  Mucinex Dm Maximum Strength 60-1200 Mg Xr12h-tab (Dextromethorphan-guaifenesin) .... One twice as needed for cough  Complete Medication List: 1)  Prilosec Otc 20 Mg Tbec (Omeprazole magnesium) .... Take  one 30-60 min before first meal of the day 2)  Symbicort 160-4.5 Mcg/act Aero (Budesonide-formoterol fumarate) .... 2 puffs first thing  in am and 2 puffs again in pm about 12 hours later 3)  Prednisone 10 Mg Tabs (Prednisone) .... 2 daily until better, then 1 daily 4)  Pepcid Ac Maximum Strength 20 Mg Tabs (Famotidine) .... One at bedtime 5)  Tramadol Hcl 50 Mg Tabs (Tramadol hcl) .... One to two by mouth every 4-6 hours if needed for excess cough 6)  Proair Hfa 108 (90 Base) Mcg/act Aers (Albuterol sulfate) .... 2 puffs every 4-6 hrs as  needed if short of  breath 7)  Mucinex Dm Maximum Strength 60-1200 Mg Xr12h-tab (Dextromethorphan-guaifenesin) .... One twice as needed for cough 8)  Augmentin 875-125 Mg Tabs (Amoxicillin-pot clavulanate) .... By mouth twice daily with bfast and supper and large glass of water  Other Orders: Admin 1st Vaccine ([address]) Flu Vaccine 71yrs + (60454) Pneumococcal Vaccine (09811) Admin of Any Addtl Vaccine (91478) Admin 1st Vaccine (State) [phone]) Admin of Any Addtl Vaccine (State) (90472S) T-2 View CXR (71020TC) Est. Patient Level IV (30865)  Patient Instructions: 1)  Prednisone take 2 with breakfast daily  until better then 1 daily until seen. 2)  Symbicort 160 2 puffs first thing  in am and 2 puffs again in pm about 12 hours later 3)  For cough use mucinex dm one twice daily 4)  See Tammy NP w/in 2 weeks with all your medications, even over the counter meds, separated in two separate bags, the ones you take no matter what vs the ones you stop once you feel better and take only as needed.  She will generate for you a new user friendly medication calendar that will put [place] all on the same page re: your medication use.  Bring a relative with you to visit Prescriptions: PREDNISONE 10 MG  TABS (PREDNISONE) 2 daily until better, then 1 daily  #50 x 0   Entered and Authorized by:   Nyoka Cowden MD   Signed by:   Nyoka Cowden MD on 10/31/2009   Method used:   Electronically to        [facility] (retail)       [address]       [place], [place]  [address]       Ph: [phone]       Fax: [phone]   RxID:   [id number]    Pneumovax Vaccine    Vaccine Type: Pneumovax    Site: left deltoid    Mfr: Merck    Dose: 0.5 ml    Route: IM    Given by: Vernie Murders    Exp. Date: 04/26/2011    Lot #: [id number]    VIS given: 01/16/09 version given October 31, 2009.         Flu Vaccine Consent Questions     Do you have a history of severe allergic reactions to this  vaccine? no    Any prior history of allergic reactions to egg and/or gelatin? no    Do you have a sensitivity to the preservative Thimersol? no    Do you have a past history of Guillan-Barre Syndrome? no    Do you currently have an acute febrile illness? no    Have you ever had a severe reaction to latex? no    Vaccine information given and explained to patient? yes    Are you currently pregnant? no    Lot Number:[id number]   Exp Date:08/11/2010   Site Given right Deltoid IMlu Vernie Murders  October 31, 2009 11:41 AM

## 2010-03-15 NOTE — Letter (Signed)
Summary: Out of Work  [facility]  [address]   [place], [place] [address]   Phone: [phone]  Fax: [phone]    November 02, 2009   Employee:  Math BHARGAV BARBARO    To Whom It May Concern:   For Medical reasons, please excuse the above named employee from work for the following dates:  Start:   November 02, 2009  End:   November 16, 2009  If you need additional information, please feel free to contact our office.         Sincerely,    Sandrea Hughs, MD

## 2010-03-20 ENCOUNTER — Ambulatory Visit (INDEPENDENT_AMBULATORY_CARE_PROVIDER_SITE_OTHER): Payer: BC Managed Care – PPO | Admitting: Internal Medicine

## 2010-03-20 ENCOUNTER — Encounter: Payer: Self-pay | Admitting: Internal Medicine

## 2010-03-20 DIAGNOSIS — K219 Gastro-esophageal reflux disease without esophagitis: Secondary | ICD-10-CM

## 2010-03-20 DIAGNOSIS — J45909 Unspecified asthma, uncomplicated: Secondary | ICD-10-CM

## 2010-03-27 ENCOUNTER — Inpatient Hospital Stay: Payer: Self-pay | Admitting: Adult Health

## 2010-03-29 NOTE — Assessment & Plan Note (Signed)
Summary: Pulmonary/ ext f/u with hfa 90% effective,  taper off prednisone   Primary Provider/Referring Provider:  Dr. Janace Hoard  CC:  Dyspnea and cough- improved.  History of Present Illness: 60 yo vi[place]namese male  quit smoking 1999 with AB Was admitted 10/22/2007 acute resp failure   Extubated on 9/11 and discharged 9/14 on symbicort and prednisone taper.  Spirometry and walking sats normal when treated and repeatedly flare off rx     September 13, 2009 cc cough "for over 2 yrs"- l .  Cough is prod with yellow sputum and is worse at bedtime.  He also c/o runny nose. improves sev weeks to maybe a month after a cortisone.  rec sinus ct > positive  sinusitis rx augmentin x 10-20 days, only took  10 because better and maintained acid suppression.  August 17, 2011cc cough better but not gone. Still has some chest congestion and sob at times esp in am.   rec 10 days augmentin some better transiently  then worse again when finished so complete 10 more days and then do ct sinus > never had it.  October 25, 2009 ov cc SOB is worse with exertion and at rest...cough is more prod with yellow to green mucus...not sleeping well  worse x 1 week despite maintaining gerd rx.   Marked increase in saba, both hfa and neb which we were not aware he had per our med record. rec 21 days augmentin then ct sinus     see page 2    October 31, 2009 ov  c/o worsening cough x 3 days- prod with white to orange colored sputum.  He states that cough gets worse at night with wheezing and has been unable to sleep for the past 3 nights. requested to bring all meds, brought empty proaire and no neb solutions and not sure what he's putting in machine.    doesn't think any of his meds are working any more, even transiently, though not sure about this hx as didn't bring fm member>> rx pred 20mg  ceiliing/10mg  floor  November 14, 2009 --Presents for follow up and med review.  His son is with him today and helps with his meds. We  reviewed his meds and organized them into a med calendar. Recent CT sinus showed acute sinusitis, currently on augmentin x 21 days. recent xray with increased atx on right. He has couple days of augmentin left. He is feeling better. Prednisone was increased last ov due increased cough. Cough and wheezing is better.  rec hold pred at 10 mg daily. Sinus ct repeated p rx  10/5 and resolved pansinusitis  November 29, 2009 ov cc cough, resolved, breathing fine.  using Calendar better with help of son.  rec try prednisone 10 mg one-half  January 10, 2010 ov cough much better not using proair, excellent technique with symbicort, on 5 mg per day. rec Try Prednisone 10 mg one half odd days only, take none on even days. Remember to keep proaire handy for as needed use    1/26-30/2012 admit for ? aecopd in setting of severe N and V, back to baseline p discharge  see page 3          March 20, 2010 ov f/u asthma on pred 10 mg one half on even, no change on days he takes vs not taking.  Pt denies any significant sore throat, dysphagia, itching, sneezing,  nasal congestion or excess secretions,  fever, chills, sweats, unintended wt loss,  pleuritic or exertional cp, hempoptysis, change in activity tolerance  orthopnea pnd or leg swelling Pt also denies any obvious fluctuation in symptoms with weather or environmental change or other alleviating or aggravating factors.  no nocturnal events   Current Medications (verified): 1)  Symbicort 160-4.5 Mcg/act  Aero (Budesonide-Formoterol Fumarate) .... 2 Puffs First Thing  in Am and 2 Puffs Again in Pm About 12 Hours Later 2)  Prilosec Otc 20 Mg Tbec (Omeprazole Magnesium) .... Take  One 30-60 Min Before First Meal of The Day 3)  Pepcid Ac Maximum Strength 20 Mg Tabs (Famotidine) .... One At Bedtime 4)  Prednisone 10 Mg  Tabs (Prednisone) .... Take 1/2 Tablet By Mouth Once A Day 5)  Proair Hfa 108 (90 Base) Mcg/act Aers (Albuterol Sulfate) .... 2 Puffs Every 4-6  Hrs As Needed If Short of Breath 6)  Mucinex Dm Maximum Strength 60-1200 Mg Xr12h-Tab (Dextromethorphan-Guaifenesin) .... Take 1-2 Tablets Every 12 Hours As Needed 7)  Tramadol Hcl 50 Mg  Tabs (Tramadol Hcl) .Marland Kitchen.. 1 Tab Every 4 Hours As Needed For Excessive Coughing- Ran Out 8)  Zyrtec Allergy 10 Mg Tabs (Cetirizine Hcl) .... Take 1 Tab By Mouth At Bedtime As Needed- Not Taking  Allergies (verified): No Known Drug Allergies  Past History:  Past Medical History: 1) pneumonia bilateral, diagnosed on July 31, 2007.    2) CT negative for PE - June 2009 3) VDRF -sept 2009. Admitted for 4 days. Intubated for 1 day 4) HIV Negative - sept 2009 [facility] -      - HFA 100% effective tecnique September 27, 2009 > 75% October 31, 2009 > 90% November 29, 2009  Chronic cough ? etiology     - no better on qvar and singulair both stopped September 13, 2009 > no worse cough September 27, 2009      - Sinus CT 09/14/09 > pansinusitis       -  rx 21 days rx started  c augment October 25, 2009 >> repeat sinus ct:neg 11/15/09     - Daily prednisone rx October 31, 2009 >>> try qod dosing January 10, 2010 > taper off by end of Feb 12 Health Maintenance      - Pneumovax given October 31, 2009  COMPLEX MED REGIMEN-Meds reviewed with pt education and computerized med calendar completed 11/14/09  Past Pulmonary History:  Pulmonary History: 1) pneumonia bilateral, diagnosed on July 31, 2007.  He was treated with azithromycin. Seen on CT chest   2) CT negative for PE - June 2009 3) VDRF -sept 2009. Admitted for 4 days. Intubated for 1 day 4) HIV Negative - sept 2009 at [facility])  ASTHMA -   > Spirometry 11/06/2007 -> normal while on symbicort > ASTHMA - diagnosed 12/15/2007 by Dr. Shelle Iron- based on decompensation off inhaled steroids in Oct 2009 and normalization of symptoms when symbicort restarted >Spirometry 05/31/2008: fev1 2.8L/65%, FVC 3.94L/74%, Ratio 71 (90), Fef 25-75%: 1.9L/44% >Spirometry 06/21/2008:  Fev1  2.76L/64%, FVC 3.84L/72%, Ratio 72 (91). -cont advair hfa >spiro 09/05/2008: Fev1 2.82L/76%, FVC 4.05L/77%, Ratio 70 (70) - cont advair 230 hfa, start singualr  Vital Signs:  Patient profile:   60 year old male Weight:      185 pounds O2 Sat:      98 % on Room air Temp:     97.8 degrees F oral Pulse rate:   78 / minute BP sitting:   134 / 90  (left arm)  Vitals Entered  By: Vernie Murders (March 20, 2010 3:41 PM)  O2 Flow:  Room air  Physical Exam  Additional Exam:  amb vietnamese male limited English, nad  wt 175  September 27, 2009    > 180 November 29, 2009 > 184 January 10, 2010 > 185 March 20, 2010  HEENT:  [place]/AT, , EACs-clear, TMs-wnl, NOSE-pale , THROAT-clear, no exudate NECK:  Supple w/ fair ROM; no JVD; normal carotid impulses w/o bruits; no thyromegaly or nodules palpated; no lymphadenopathy. RESP  clear to A and P  CARD:  RRR, no m/r/g   GI:   Soft & nt; nml bowel sounds; no organomegaly or masses detected. Musco: Warm bil,  no calf tenderness edema, clubbing, pulses intact  .    Impression & Recommendations:  Problem # 1:  ASTHMA (ICD-493.90)  Apparent flare in setting on N and V ? severe GERD related   All goals of asthma met including optimal function and elimination of symptoms with minimum need for rescue therapy. Contingencies discussed today including the rule of two's, albeit on a complex regimen than contains prednisone.  Now that sinus dz addressed should be able to simplify rx  I spent extra time with the patient today explaining optimal mdi  technique.  Now 100% effective   Try to taper prednisone to 5 mg  every 3rd day with goal of stopping it completely by end of Feb 2012   Each maintenance medication was reviewed in detail including most importantly the difference between maintenance and as needed and under what circumstances the prns are to be used. This was done in the context of a medication calendar review which provided the patient with a  user-friendly unambiguous mechanism for medication administration and reconciliation and provides an action plan for all active problems. It is critical that this be shown to every doctor  for modification during the office visit if necessary so the patient can use it as a working document.   Orders: Est. Patient Level IV (16109)  Problem # 2:  G E R D (ICD-530.81)  His updated medication list for this problem includes:    Prilosec Otc 20 Mg Tbec (Omeprazole magnesium) .Marland Kitchen... Take  one 30-60 min before first meal of the day    Pepcid Ac Maximum Strength 20 Mg Tabs (Famotidine) ..... One at bedtime  Diet also reviewed.  Medications Added to Medication List This Visit: 1)  Prednisone 10 Mg Tabs (Prednisone) .... Take 1/2 tablet by mouth once a day  Patient Instructions: 1)  Change prednisone to 10 mg one half every 3d day until the end of February then stop it unless breathing, coughing worse or really fatgue or nause develop  2)  See calendar for specific medication instructions and bring it back for each and every office visit for every healthcare provider you see.  Without it,  you may not receive the best quality medical care that we feel you deserve.  3)  Please schedule a follow-up appointment in 6 weeks, sooner if needed

## 2010-03-29 NOTE — Progress Notes (Signed)
Summary: needs ov- appt sched  ---- Converted from flag ---- ---- 03/13/2010 8:54 AM, Nyoka Cowden MD wrote: needs ov asap with all meds in hand, either me or Tammy NP ------------------------------  Pt has appt sched for 03/20/10- I need to remind him of this appt. Called and LMOMTCB Nathaniel Hartman  March 13, 2010 12:18 PM  [facility] x 2 Nathaniel Hartman  March 16, 2010 4:51 PM  Pt has appt today Nathaniel Hartman  March 20, 2010 11:32 AM

## 2010-04-25 ENCOUNTER — Encounter: Payer: Self-pay | Admitting: Internal Medicine

## 2010-04-25 ENCOUNTER — Ambulatory Visit (INDEPENDENT_AMBULATORY_CARE_PROVIDER_SITE_OTHER): Payer: BC Managed Care – PPO | Admitting: Internal Medicine

## 2010-04-25 DIAGNOSIS — J45909 Unspecified asthma, uncomplicated: Secondary | ICD-10-CM

## 2010-04-29 LAB — POCT I-STAT, CHEM 8
BUN: 14 mg/dL (ref 6–23)
Calcium, Ion: 1.06 mmol/L — ABNORMAL LOW (ref 1.12–1.32)
HCT: 48 % (ref 39.0–52.0)
Hemoglobin: 16.3 g/dL (ref 13.0–17.0)
Sodium: 139 mEq/L (ref 135–145)
TCO2: 25 mmol/L (ref 0–100)

## 2010-04-29 LAB — BASIC METABOLIC PANEL
BUN: 15 mg/dL (ref 6–23)
CO2: 25 mEq/L (ref 19–32)
Calcium: 8.8 mg/dL (ref 8.4–10.5)
Chloride: 110 mEq/L (ref 96–112)
Creatinine, Ser: 1.01 mg/dL (ref 0.4–1.5)
GFR calc Af Amer: >60 mL/min (ref >60)
GFR calc non Af Amer: >60 mL/min (ref >60)
Glucose, Bld: 142 mg/dL — ABNORMAL HIGH (ref 70–99)
Potassium: 4.4 mEq/L (ref 3.5–5.1)
Sodium: 138 mEq/L (ref 135–145)

## 2010-04-29 LAB — POCT I-STAT 3, ART BLOOD GAS (G3+)
Acid-Base Excess: 1 mmol/L (ref 0.0–2.0)
Patient temperature: 97.3

## 2010-04-29 LAB — CBC
HCT: 39.7 % (ref 39.0–52.0)
HCT: 44.1 % (ref 39.0–52.0)
Hemoglobin: 14.6 g/dL (ref 13.0–17.0)
RDW: 15.5 % (ref 11.5–15.5)
RDW: 15.7 % — ABNORMAL HIGH (ref 11.5–15.5)
WBC: 5.7 10*3/uL (ref 4.0–10.5)
WBC: 8.6 10*3/uL (ref 4.0–10.5)

## 2010-04-29 LAB — PROTIME-INR: INR: 1.02 (ref 0.00–1.49)

## 2010-04-29 LAB — CULTURE, BLOOD (ROUTINE X 2): Culture: NO GROWTH

## 2010-04-29 LAB — DIFFERENTIAL
Basophils Absolute: 0 10*3/uL (ref 0.0–0.1)
Lymphocytes Relative: 37 % (ref 12–46)
Monocytes Absolute: 0.6 10*3/uL (ref 0.1–1.0)
Neutro Abs: 3.6 10*3/uL (ref 1.7–7.7)
Neutrophils Relative %: 42 % — ABNORMAL LOW (ref 43–77)

## 2010-04-29 LAB — POCT CARDIAC MARKERS
CKMB, poc: 3 ng/mL (ref 1.0–8.0)
Troponin i, poc: <0.05 ng/mL (ref 0.00–0.09)

## 2010-04-29 LAB — BRAIN NATRIURETIC PEPTIDE: Pro B Natriuretic peptide (BNP): 49 pg/mL (ref 0.0–100.0)

## 2010-04-29 LAB — APTT: aPTT: 31 seconds (ref 24–37)

## 2010-05-01 NOTE — Assessment & Plan Note (Signed)
Summary: Pulmonary/ ext f/u > try off prednisone, contingencies discussed   Primary Provider/Referring Provider:  Dr. Janace Hoard  CC:  Cough- much improved.  History of Present Illness: 60 yo vietnamese male  quit smoking 1999 with AB  adm  10/22/2007 acute resp failure   Extubated on 9/11 and discharged 9/14 on symbicort and prednisone taper.  Spirometry and walking sats normal when treated and repeatedly flare off rx     September 13, 2009 cc cough "for over 2 yrs"- l .  Cough is prod with yellow sputum and is worse at bedtime.  He also c/o runny nose. improves sev weeks to maybe a month after a cortisone.  rec sinus ct > positive  sinusitis rx augmentin x 10-20 days, only took  10 because better and maintained acid suppression.  August 17, 2011cc cough better but not gone. Still has some chest congestion and sob at times esp in am.   rec 10 days augmentin some better transiently  then worse again when finished so complete 10 more days and then do ct sinus > never had it.  October 25, 2009 ov cc SOB is worse with exertion and at rest...cough is more prod with yellow to green mucus...not sleeping well  worse x 1 week despite maintaining gerd rx.   Marked increase in saba, both hfa and neb which we were not aware he had per our med record. rec 21 days augmentin then ct sinus     see page 2    October 31, 2009 ov  c/o worsening cough x 3 days- prod with white to orange colored sputum.  He states that cough gets worse at night with wheezing and has been unable to sleep for the past 3 nights. requested to bring all meds, brought empty proaire and no neb solutions and not sure what he's putting in machine.    doesn't think any of his meds are working any more, even transiently, though not sure about this hx as didn't bring fm member>> rx pred 20mg  ceiliing/10mg  floor  November 14, 2009 --Presents for follow up and med review.  His son is with him today and helps with his meds. We reviewed his meds  and organized them into a med calendar. Recent CT sinus showed acute sinusitis, currently on augmentin x 21 days. recent xray with increased atx on right. He has couple days of augmentin left. He is feeling better. Prednisone was increased last ov due increased cough. Cough and wheezing is better.  rec hold pred at 10 mg daily. Sinus ct repeated p rx  10/5 and resolved pansinusitis  November 29, 2009 ov cc cough, resolved, breathing fine.  using Calendar better with help of son.  rec try prednisone 10 mg one-half  January 10, 2010 ov cough much better not using proair, excellent technique with symbicort, on 5 mg per day. rec Try Prednisone 10 mg one half odd days only, take none on even days. Remember to keep proaire handy for as needed use    1/26-30/2012 admit for ? aecopd in setting of severe N and V, back to baseline p discharge  see page 3          March 20, 2010 ov f/u asthma on pred 10 mg one half on even, no change on days he takes vs not taking.   rec q 3d pred  April 25, 2010 ov no cough no sob, no need prns.  no dicolored sputum.  Pt denies  any significant sore throat, dysphagia, itching, sneezing,  nasal congestion or excess secretions,  fever, chills, sweats, unintended wt loss, pleuritic or exertional cp, hempoptysis, change in activity tolerance  orthopnea pnd or leg swelling .  Pt also denies any obvious fluctuation in symptoms with weather or environmental change or other alleviating or aggravating factors.     Pt denies any increase in rescue therapy over baseline, denies waking up needing it or having early am exacerbations of coughing/wheezing/ or dyspnea    Current Medications (verified): 1)  Symbicort 160-4.5 Mcg/act  Aero (Budesonide-Formoterol Fumarate) .... 2 Puffs First Thing  in Am and 2 Puffs Again in Pm About 12 Hours Later 2)  Prilosec Otc 20 Mg Tbec (Omeprazole Magnesium) .... Take  One 30-60 Min Before First Meal of The Day 3)  Pepcid Ac Maximum Strength  20 Mg Tabs (Famotidine) .... One At Bedtime 4)  Prednisone 10 Mg  Tabs (Prednisone) .... Take 1/2 Tablet By Mouth Once A Day 5)  Proair Hfa 108 (90 Base) Mcg/act Aers (Albuterol Sulfate) .... 2 Puffs Every 4-6 Hrs As Needed If Short of Breath 6)  Mucinex Dm Maximum Strength 60-1200 Mg Xr12h-Tab (Dextromethorphan-Guaifenesin) .... Take 1-2 Tablets Every 12 Hours As Needed 7)  Tramadol Hcl 50 Mg  Tabs (Tramadol Hcl) .Marland Kitchen.. 1 Tab Every 4 Hours As Needed For Excessive Coughing- Ran Out 8)  Zyrtec Allergy 10 Mg Tabs (Cetirizine Hcl) .... Take 1 Tab By Mouth At Bedtime As Needed- Not Taking  Allergies (verified): No Known Drug Allergies  Past History:  Past Medical History: 1) pneumonia bilateral, diagnosed on July 31, 2007.    2) CT negative for PE - June 2009 3) VDRF -sept 2009. Admitted for 4 days. Intubated for 1 day 4) HIV Negative - sept 2009 [facility] -      - HFA 100% effective tecnique September 27, 2009 > 75% October 31, 2009 > 90% November 29, 2009  Chronic cough ? etiology     - no better on qvar and singulair both stopped September 13, 2009 > no worse off  September 27, 2009      - Sinus CT 09/14/09 > pansinusitis       -  rx 21 days rx started  c augment October 25, 2009 >> repeat sinus ct:neg 11/15/09     - Daily prednisone rx October 31, 2009 >>> try qod dosing January 10, 2010 > taper off  March   2012  Health Maintenance      - Pneumovax given October 31, 2009  COMPLEX MED REGIMEN-Meds reviewed with pt education and computerized med calendar completed 11/14/09  Past Pulmonary History:  Pulmonary History: 1) pneumonia bilateral, diagnosed on July 31, 2007.  He was treated with azithromycin. Seen on CT chest   2) CT negative for PE - June 2009 3) VDRF -sept 2009. Admitted for 4 days. Intubated for 1 day 4) HIV Negative - sept 2009 at [facility])  ASTHMA -   > Spirometry 11/06/2007 -> normal while on symbicort > ASTHMA - diagnosed 12/15/2007 by Dr. Shelle Iron- based on  decompensation off inhaled steroids in Oct 2009 and normalization of symptoms when symbicort restarted >Spirometry 05/31/2008: fev1 2.8L/65%, FVC 3.94L/74%, Ratio 71 (90), Fef 25-75%: 1.9L/44% >Spirometry 06/21/2008:  Fev1 2.76L/64%, FVC 3.84L/72%, Ratio 72 (91). -cont advair hfa >spiro 09/05/2008: Fev1 2.82L/76%, FVC 4.05L/77%, Ratio 70 (70) - cont advair 230 hfa, start singualr  Vital Signs:  Patient profile:   60 year old male Weight:  186 pounds O2 Sat:      97 % on Room air Temp:     97.7 degrees F oral Pulse rate:   70 / minute BP sitting:   132 / 84  (left arm)  Vitals Entered By: Vernie Murders (April 25, 2010 3:13 PM)  O2 Flow:  Room air  Physical Exam  Additional Exam:  amb vietnamese male limited English, nad  wt 175  September 27, 2009    > 180 November 29, 2009 > 184 January 10, 2010 > 185 March 20, 2010 > 186 April 27, 2010  HEENT:  [employer]/AT, , EACs-clear, TMs-wnl, NOSE-pale , THROAT-clear, no exudate NECK:  Supple w/ fair ROM; no JVD; normal carotid impulses w/o bruits; no thyromegaly or nodules palpated; no lymphadenopathy. RESP  clear to A and P  CARD:  RRR, no m/r/g   GI:   Soft & nt; nml bowel sounds; no organomegaly or masses detected. Musco: Warm bil,  no calf tenderness edema, clubbing, pulses intact  .    Impression & Recommendations:  Problem # 1:  ASTHMA (ICD-493.90)  All goals of asthma met including optimal function and elimination of symptoms with minimum need for rescue therapy. Contingencies discussed today including the rule of two's, albeit on a complex regimen than contains prednisone.  Now that sinus dz addressed should be able to simplify rx  I spent extra time with the patient today explaining optimal mdi  technique.  Now 100% effective   Try to taper prednisone to 5 mg  every 3rd day with goal of stopping it completely by end of March 2012   Each maintenance medication was reviewed in detail including most importantly the difference between  maintenance and as needed and under what circumstances the prns are to be used. This was done in the context of a medication calendar review which provided the patient with a user-friendly unambiguous mechanism for medication administration and reconciliation and provides an action plan for all active problems. It is critical that this be shown to every doctor  for modification during the office visit if necessary so the patient can use it as a working document.   Orders: Est. Patient Level IV (11914)  Patient Instructions: 1)  Ok to try prednisone every 3rd day and stop at end of month if doing well 2)  Return to office in 3 months, sooner if needed

## 2010-06-15 ENCOUNTER — Telehealth: Payer: Self-pay | Admitting: Internal Medicine

## 2010-06-15 MED ORDER — TRAMADOL HCL 50 MG PO TABS: ORAL_TABLET | ORAL | Status: DC

## 2010-06-15 NOTE — Telephone Encounter (Signed)
Rx was sent to pharm.  LMOVM for his son to be made aware this was done.

## 2010-06-26 NOTE — Consult Note (Signed)
NAME:  TIN, ENGRAM NO.:  [id number]   MEDICAL RECORD NO.:  [id number]          PATIENT TYPE:  INP   LOCATION:  1845                         FACILITY:  [facility]   PHYSICIAN:  Artist Pais. Weingold, M.D.DATE OF BIRTH:  Jan 05, 1951   DATE OF CONSULTATION:  08/25/2006  DATE OF DISCHARGE:                                 CONSULTATION   REASON FOR CONSULTATION:  Mr. 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  He is an otherwise healthy 60 year old male.   ALLERGIES:  No known drug allergies.   CURRENT MEDICATIONS:  No current medications.   HOSPITALIZATIONS:  No recent hospitalizations or surgery.   FAMILY MEDICAL HISTORY:  Noncontributory.   SOCIAL HISTORY:  Noncontributory.   PHYSICAL EXAMINATION:  Examination in the emergency apartment reveals  complex laceration of the web space of the index and long fingers with  primary involvement of the index finger with evidence of arterial  bleeding and some loss of flexion.  X-ray shows some small metallic  fragments in both the AP and lateral view.   IMPRESSION:  A 55-second-old male with complex injuries to his left hand  with possible tendon, artery, and nerve damage.   RECOMMENDATIONS:  At this point in time will go to the operating room  for I&D exploration and repair as necessary the left hand.      Artist Pais Mina Marble, M.D.  Electronically Signed     MAW/MEDQ  D:  08/25/2006  T:  08/26/2006  Job:  [id number]   cc:   Sheppard Penton. Stacie Acres, M.D.

## 2010-06-26 NOTE — Op Note (Signed)
NAME:  Nathaniel Hartman, PRETLOW NO.:  [id number]   MEDICAL RECORD NO.:  [id number]          PATIENT TYPE:  INP   LOCATION:  1845                         FACILITY:  [facility]   PHYSICIAN:  Artist Pais. Weingold, M.D.DATE OF BIRTH:  1950-06-22   DATE OF PROCEDURE:  08/25/2006  DATE OF DISCHARGE:                               OPERATIVE REPORT   PREOPERATIVE DIAGNOSIS:  Complex laceration left hand.   POSTOPERATIVE DIAGNOSIS:  Complex laceration left hand.   PROCEDURE:  Irrigation and debridement with repair of complex laceration  left hand as well as microscopic repair of ulnar digital artery left  index finger.   SURGEON:  Artist Pais. Mina Marble, M.D.   ASSISTANT:  None.   ANESTHESIA:  General.   TOURNIQUET TIME:  50 minutes.   COMPLICATIONS:  None.   DRAINS:  None.   DESCRIPTION OF PROCEDURE:  The patient was taken to the operating suite  after the induction of adequate general anesthesia.  The left upper  thigh was prepped and draped in the usual sterile fashion.  An Esmarch  was used to exsanguinate the limb.  Tourniquet was inflated to 250 mm.  At this point in time, complex lacerations along the web space between  the index and long, and long and ring were debrided using 3 liters of  normal saline in pulse lavage.  Once this was done, visualization  revealed a laceration of the ulnar digital artery on the index finger.  Tendons and nerves were otherwise intact.  The microscope was brought on  the field and using 10-0 nylon, a repair of the ulnar digital artery at  the proximal phalangeal level was undertaken using five sutures.  After  the repair was completed, the wound was irrigated and all three wounds  were loosely closed with 4-0 nylon.  Xeroform, 4x4s fluffs and  compressive dressing applied.  The patient tolerated the procedure well  and went to the recovery room in stable fashion.      Artist Pais Mina Marble, M.D.  Electronically Signed     MAW/MEDQ  D:   08/25/2006  T:  08/26/2006  Job:  [id number]

## 2010-06-26 NOTE — Discharge Summary (Signed)
NAME:  Nathaniel Hartman, Nathaniel Hartman               ACCOUNT NO.:  [id number]   MEDICAL RECORD NO.:  [id number]          PATIENT TYPE:  INP   LOCATION:  4711                         FACILITY:  [facility]   PHYSICIAN:  Kalman Shan, MD   DATE OF BIRTH:  Jul 11, 1950   DATE OF ADMISSION:  10/22/2007  DATE OF DISCHARGE:  10/26/2007                               DISCHARGE SUMMARY   DISCHARGE DIAGNOSES:  1. Status post acute respiratory failure secondary to exacerbation of      chronic obstructive pulmonary disease.  2. Gastroesophageal reflux disease.   LABORATORY DATA:  Urine strep and Legionella antigens obtained on date  of admission both negative.  Bronchioalveolar lavage on October 22, 2007 negative.  On October 24, 2007, sodium 140, potassium 4.3,  chloride 105, CO2 28, glucose 15, BUN 18, and creatinine 0.89.  White  blood cell count 9, hemoglobin 12.4, hematocrit 37.5, and platelet count  162.   PROCEDURES:  1. Endotracheal tube placed, October 22, 2007, and removed,      October 23, 2007.  2. Right knee internal jugular central venous catheter placed on      October 22, 2007, removed October 24, 2007.  3. Right radial A-line placed on October 22, 2007, removed October 23, 2007.   BRIEF HISTORY:  This is a 60 year old male patient admitted on October 22, 2007 with known history of chronic bronchitis presented  approximately 5:00 a.m. with acute onset of severe shortness of breath  from which he woke up from.  Upon presentation, his saturations were 68%  on room air.  Emergency room evaluation was negative for cardiac  etiology.  He was admitted for further evaluation and therapy.   HOSPITAL COURSE:  1. Acute respiratory failure secondary to exacerbation of chronic      obstructive pulmonary disease.  Nathaniel Hartman was admitted to [facility], intubated in the emergency room for acute      respiratory failure, placed on mechanical ventilation and  transferred from the intensive care.  His therapy included systemic      IV Solu-Medrol, empiric antibiotics in the form of azithromycin and      Rocephin, scheduled bronchodilators, and supplemental oxygen as      well as sedation therapy while mechanically ventilated.  His      pulmonary status continued to improve over the course of his      hospitalization.  He was successfully extubated on October 23, 2007.  Since that time, he has been weaned off supplemental oxygen,      transitioned to oral prednisone, and maintenance bronchodilator      regimen.  It is felt that [name] had several episodes of      nausea, vomiting, and felt to be secondary to reflux in the past      which seems to be exacerbating his symptomatology.  Because of      this, he will also be sent home on twice daily Protonix for the      first  4 weeks, then followed by daily proton accident from here on      out.  He will be followed up in the outpatient setting on a regimen      of daily Symbicort, prednisone taper, 2 more days of empiric      Avelox.  Of note, bronchioalveolar lavage, urinary strep, and      Legionella urinary antigens were all negative.  Also will be given      a prescription for rescue beta agonist.  [name] has met      maximum benefit from inpatient care.  The remainder of his      pulmonary needs will be followed through the outpatient setting.  2. Gastroesophageal reflux disease.  Plan for this is as mentioned      above.   DISCHARGE INSTRUCTIONS:  Diet as tolerated.  Follow up with Dr. Kalman Shan, November 06, 2007 at 2:30 p.m.   DISCHARGE MEDICATIONS:  1. Prednisone 10 mg tablet 4 tablets daily x3 days, then 3 tablets      daily x3 days, then 2 tablets daily x3 days, then 1 tablet daily x3      days, then stop.  2. Protonix 40 mg twice a day x4 weeks, then down to 1 daily.  3. Symbicort 160/4.5 two puffs twice a day.  He was instructed to      rinse his mouth  after this.  4. Ventolin HFA 2 puffs every 4 hours as needed for shortness of      breath.  5. Avelox 400 mg tablet 1 tablet daily x2 more days, then discontinue.   DISPOSITION:  [name] has met maximum benefit from inpatient care.  He will be discharged to home as mentioned above.    STAFF NOTE: >30 minutes spent in discharge planning. Note by Zenia Resides and modified by me. I met Nathaniel Hartman for first time today on  day of discharge.      Zenia Resides, NP      Kalman Shan, MD  Electronically Signed    PB/MEDQ  D:  10/26/2007  T:  10/27/2007  Job:  [id number]   cc:   Kalman Shan, MD

## 2010-06-26 NOTE — H&P (Signed)
NAME:  Nathaniel Hartman, Nathaniel Hartman NO.:  [id number]   MEDICAL RECORD NO.:  [id number]          PATIENT TYPE:  INP   LOCATION:  6715                         FACILITY:  [facility]   PHYSICIAN:  Della Goo, M.D. DATE OF BIRTH:  February 05, 1951   DATE OF ADMISSION:  12/05/2007  DATE OF DISCHARGE:                              HISTORY & PHYSICAL   PRIMARY CARE PHYSICIAN:  Dr. Jeannetta Nap in [place].   CHIEF COMPLAINT:  Shortness of breath.   HISTORY OF PRESENT ILLNESS:  This is a 60 year old male presenting to  the emergency department with complaints of severe worsening shortness  of breath, chest tightness and wheezing that started at about 9:00 p.m.  prior to admission.  The patient has a history of COPD/emphysema with  chronic bronchitis which was diagnosed 2 years ago.  The patient denies  having any fevers, chills or nausea, vomiting, diarrhea.  The patient's  family is at bedside and assist with giving the history, and the patient  has chronic productive cough secondary to his chronic bronchitis of  yellowish mucus.   PAST MEDICAL HISTORY:  As mentioned above, the COPD/emphysema and  chronic bronchitis.   PAST SURGICAL HISTORY:  None.   MEDICATIONS:  Albuterol, Symbicort and Tussionex p.r.n.   ALLERGIES:  NO KNOWN DRUG ALLERGIES.   SOCIAL HISTORY:  The patient is married.  He quit smoking 2 years ago.  He previously had smoked one half-pack daily.  No history of alcohol or  illicit drug usage.   FAMILY HISTORY:  Unable to obtain at this time.   REVIEW OF SYSTEMS:  Pertinents are mentioned above.   PHYSICAL EXAMINATION:  GENERAL:  This is a 60 year old well-nourished,  well-developed male in acute distress.  VITAL SIGNS:  Temperature 97.7, blood pressure 155/93, heart rate 112,  respirations 24-32 and his O2 sat is 84-88%.  HEENT:  Normocephalic, atraumatic.  Pupils are erythematous.  There is  no scleral icterus.  Pupils are equally round and  reactive to light.  Extraocular movements are intact.  Funduscopic benign.  Oropharynx dry.  Currently on 100% FIO2 facial mask.  NECK:  Supple.  Full range of motion.  No thyromegaly, adenopathy or  jugulovenous distention.  CARDIOVASCULAR:  Regular rate and rhythm.  No  murmurs, gallops or rubs.  LUNGS:  Have decreased breath sounds bilaterally.  No expiratory wheezes  appreciated at this time.  Poor air movement.  Occasional rhonchi.  ABDOMEN:  Positive bowel sounds.  Soft, nontender, nondistended.  EXTREMITIES:  Without cyanosis, clubbing or edema.  NEUROLOGIC:  Nonfocal.   LABORATORY STUDIES:  White blood cell count 9.2, hemoglobin 14.1,  hematocrit 44.5, platelets 173, neutrophils 43%, lymphocytes 30%.  Sodium 138, potassium 3.8, chloride 107, bicarb 25, BUN 17, creatinine  1.08 and glucose 112.  Blood gas performed; this may be a mixed venous  blood gas, but the pH was 7.379, the pCO2 was 44.2, the pO2 was 49.0,  bicarb 26.2.  The O2 sat was 84%.  It was not stated how much oxygen at  that time or if at all.  Beta natriuretic peptide  less than 30.0.  Chest  x-ray reveals bibasilar atelectasis.   ASSESSMENT:  A 60 year old male being admitted with:  1. Chronic obstructive pulmonary disease exacerbation.  2. Hypoxemia.  3. Shortness of breath  4. Chronic bronchitis.   PLAN:  The patient will be admitted to a telemetry area for cardiac and  pulmonary monitoring.  Supplemental oxygen has been ordered.  The  patient will be placed on a high-dose IV steroid taper along with  albuterol and Atrovent nebulizer treatments, and DVT and GI prophylaxis  have also been ordered.  Cardiac enzymes have also been ordered as well  and further workup will ensue pending results of the patient's clinical  studies and his clinical course.      Della Goo, M.D.  Electronically Signed     HJ/MEDQ  D:  12/05/2007  T:  12/05/2007  Job:  [id number]

## 2010-06-26 NOTE — Discharge Summary (Signed)
NAME:  Nathaniel Hartman, GEE NO.:  [id number]   MEDICAL RECORD NO.:  [id number]          PATIENT TYPE:  INP   LOCATION:  5528                         FACILITY:  [facility]   PHYSICIAN:  Eliseo Gum, M.D.   DATE OF BIRTH:  05-Jun-1950   DATE OF ADMISSION:  08/09/2007  DATE OF DISCHARGE:  08/10/2007                               DISCHARGE SUMMARY   CONTINUITY DOCTOR:  Edward L. Juanetta Gosling, M.D.   DISCHARGE DIAGNOSES:  1. Hypoxemia secondary to mucus plug versus acute bronchospasm.  2. Bronchitis.  3. History of pneumonia on July 18, 2007.   DISCHARGE MEDICATIONS:  1. Albuterol 90 mcg inhaler take 1-2 puff every 6 hours as needed.  2. Guaifenesin 200 mg 1 tablet every 4 hours as needed.  3. Avelox 400 mg 1 p.o. for 7 days.  4. Tylenol 650 mg 1 p.o. every 8 hours as needed.   DISPOSITION/FOLLOWUP:  Mr. Shock will follow up with Dr. Juanetta Gosling on  the outpatient basis.  His shortness of breath and cough needs to be  followed up.  He will also need a  CBC as an outpatient.   PROCEDURE PERFORMED:  2D echo, results need to be followed up.   HISTORY OF PRESENT ILLNESS:  He is a 60 year old man with past medical  history significant for pneumonia bilateral, diagnosed on July 31, 2007.  He was treated with azithromycin.  He finished the treatment.  He comes  to the emergency department complaining of shortness of breath and  cough.  He relates that 1 week prior to admission, he came to the ED for  cough and shortness of breath.  He was getting better, but the morning  of admission he started having productive cough, worsening of shortness  of breath.  He relates a brownish sputum with no blood.  He denies  fevers, chills, myalgias, sore throat, runny nose, sick contact, night  sweats, weight loss, chest pain, or dyspnea on exertion.   PHYSICAL EXAMINATION:  VITAL SIGNS:  Temperature 98.7, blood pressure  184/100, pulse 120, respirations 30, and sats 81% on room air, then 94%  on  15 L.  GENERAL:  He was in not acute distress when we saw him.  EYES:  Pupils equal and reactive to light.  Extraocular muscles intact.  EAR, NOSE, AND THROAT:  He has some mild erythema.  No tonsillar  enlargement.  RESPIRATORY:  He has bilateral good air movement.  Bilateral crackles at  the base plus diffuse rhonchi.  CARDIOVASCULAR:  S1 and S2 normal, regular rhythm and rate.  Tachycardic.  GASTROINTESTINAL:  Bowel sounds are positive, soft, nontender,  nondistended.  No guarding.  EXTREMITIES:  Pulse positive, no edema.  LYMPH:  No lymphadenopathy.  NEURO:  Nonfocal.   LABORATORY DATA:  Sodium 140, potassium 3.9, chloride 105, bicarb 28,  BUN 15, creatinine 1.2, glucose 117, and anion gap 7.  White blood cell  8.6, hemoglobin 14.8, platelets 189, ANC 3.5, and MCV 82.6.  He has  eosinophil of 15% at 1.3.  D-dimer more than 20.  PT 13 and INR 1.0.   PROCEDURE  PERFORMED:  CT chest shows severe peribronchial thickening  suggestive of severe bronchiolitis and negative for pulmonary embolism.   HOSPITAL COURSE:  1. Dyspnea/hypoxemia.  Mr. Seliga was admitted to stepdown unit.      When he was brought to the ED, he received nebulizer treatment and      he was started on oxygen 15 L .  After 2 or 3 hours, his oxygen      saturation improved.  He was on 96% on room air.  We admitted him      for observation to rule out any other complications of hypoxemia.      He had a CT angio that was negative for pulmonary embolism.  We      consider that his acute hypoxemia and shortness of breath could be      secondary to a bronchospasm event or a mucus plug.  We also started      him on Avelox 400 mg.  During hospitalization, his vital signs were      stable.  His blood pressure was 140/90, his pulse was 80,      respirations 15, and he was 96% on room air.   1. Eosinophilia.  We repeated CBC and his eosinophil level was normal,      so this was likely a lab error or acute allergy event.   Repeat      eosinophil level was 0.   DISCHARGE LABS AND VITALS:  On the day of discharge, Mr. Galentine was in  good condition.  He relates no shortness of breath and improvement of  cough.  His vitals, temperature 97.9, blood pressure 140/80, pulse 80,  respirations 15, and sats 96% on room air.      Hartley Barefoot, MD  Electronically Signed      Eliseo Gum, M.D.  Electronically Signed    BR/MEDQ  D:  08/11/2007  T:  08/12/2007  Job:  [id number]   cc:   Ramon Dredge L. Juanetta Gosling, M.D.

## 2010-09-12 ENCOUNTER — Encounter: Payer: Self-pay | Admitting: Internal Medicine

## 2010-09-13 ENCOUNTER — Encounter: Payer: Self-pay | Admitting: Internal Medicine

## 2010-09-13 ENCOUNTER — Ambulatory Visit (INDEPENDENT_AMBULATORY_CARE_PROVIDER_SITE_OTHER): Payer: BC Managed Care – PPO | Admitting: Internal Medicine

## 2010-09-13 VITALS — BP 156/100 | HR 88 | Temp 98.6°F | Ht 74.0 in | Wt 175.2 lb

## 2010-09-13 DIAGNOSIS — J45909 Unspecified asthma, uncomplicated: Secondary | ICD-10-CM

## 2010-09-13 MED ORDER — PREDNISONE (PAK) 10 MG PO TABS: ORAL_TABLET | ORAL | Status: AC

## 2010-09-13 NOTE — Patient Instructions (Addendum)
Prednisone 10 mg take  4 each am x 2 days,   2 each am x 2 days,  1 each am x2days and stop   GERD (REFLUX)  is an extremely common cause of respiratory symptoms, many times with no significant heartburn at all.    It can be treated with medication, but also with lifestyle changes including avoidance of late meals, excessive alcohol, smoking cessation, and avoid fatty foods, chocolate, peppermint, colas, red wine, and acidic juices such as orange juice.  NO MINT OR MENTHOL PRODUCTS SO NO COUGH DROPS  USE SUGARLESS CANDY INSTEAD (jolley ranchers or Stover's)  NO OIL BASED VITAMINS   Please schedule a follow up office visit in 6 weeks, call sooner if needed with pft's  Son will monitor na and bp

## 2010-09-13 NOTE — Progress Notes (Signed)
Subjective:     Patient ID: Nathaniel Hartman, male   DOB: 1950/11/25, 60 y.o.   MRN: [id number]  HPI  60 yo vi[place]namese male quit smoking 1999 with AB adm 10/22/2007 acute resp failure Extubated on 9/11 and discharged 9/14 on symbicort and prednisone taper. Spirometry and walking sats normal after this episode c/w asthma, not copd.  January 10, 2010 ov cough much better not using proair, excellent technique with symbicort, on 5 mg per day. rec Try Prednisone 10 mg one half odd days only, take none on even days.  Remember to keep proaire handy for as needed use   1/26-30/2012 admit for ? aecopd in setting of severe N and V, back to baseline p discharge    March 20, 2010 ov f/u asthma on pred 10 mg one half on even, no change on days he takes vs not taking. rec q 3d pred   April 25, 2010 ov no cough no sob, no need prns. no dicolored sputum.  rec ) Ok to try prednisone every 3rd day and stop at end of month if doing well   09/13/2010 f/u ov/Puneet Masoner cc increase cough mostly day, not bothering him once asleep. Minimal use of saba. No purulent sputum.  Pt denies any significant sore throat, dysphagia, itching, sneezing,  nasal congestion or excess/ purulent secretions,  fever, chills, sweats, unintended wt loss, pleuritic or exertional cp, hempoptysis, orthopnea pnd or leg swelling.    Also denies any obvious fluctuation of symptoms with weather or environmental changes or other aggravating or alleviating factors.     Past Medical History:  1) pneumonia bilateral, diagnosed on July 31, 2007.  2) CT negative for PE - June 2009  3) VDRF -sept 2009. Admitted for 4 days. Intubated for 1 day  4) [facility] - sept 2009 [facility]  ASTHMA -  - HFA 100% effective tecnique September 27, 2009 > 75% October 31, 2009 > 90% November 29, 2009  Chronic cough ? etiology  - no better on qvar and singulair both stopped September 13, 2009 > no worse off September 27, 2009  - Sinus CT 09/14/09 > pansinusitis  - rx 21 days rx  started c augment October 25, 2009 >> repeat sinus ct:neg 11/15/09  - Daily prednisone rx October 31, 2009 >>> try qod dosing January 10, 2010 > tapered off completely 05/2010 Health Maintenance  - Pneumovax given October 31, 2009  COMPLEX MED REGIMEN-Meds reviewed with pt education and computerized med calendar completed 11/14/09    Pulmonary History:  1) pneumonia bilateral, diagnosed on July 31, 2007. He was treated with azithromycin. Seen on CT chest  2) CT negative for PE - June 2009  3) VDRF -sept 2009. Admitted for 4 days. Intubated for 1 day  4) HIV Negative - sept 2009 at [facility]  5) ASTHMA -  > Spirometry 11/06/2007 -> normal while on symbicort  > ASTHMA - diagnosed 12/15/2007 by Dr. Shelle Iron- based on decompensation off inhaled steroids in Oct 2009 and normalization of symptoms when symbicort restarted  >Spirometry 05/31/2008: fev1 2.8L/65%, FVC 3.94L/74%, Ratio 71 (90), Fef 25-75%: 1.9L/44%  >Spirometry 06/21/2008: Fev1 2.76L/64%, FVC 3.84L/72%, Ratio 72 (91).    >spiro 09/05/2008: Fev1 2.82L/76%, FVC 4.05L/77%, Ratio 70 (70) -       Review of Systems     Objective:   Physical Exam amb vietnamese male limited English, nad  wt 175 September 27, 2009 > 180 November 29, 2009 >   > 186 April 27, 2010 >  182 09/13/2010  HEENT: [place]/AT, , EACs-clear, TMs-wnl, NOSE-pale , THROAT-clear, no exudate  NECK: Supple w/ fair ROM; no JVD; normal carotid impulses w/o bruits; no thyromegaly or nodules palpated; no lymphadenopathy.  RESP clear to A and P  CARD: RRR, no m/r/g  GI: Soft & nt; nml bowel sounds; no organomegaly or masses detected.  Musco: Warm bil, no calf tenderness edema, clubbing, pulses int    Assessment:         Plan:

## 2010-09-13 NOTE — Assessment & Plan Note (Addendum)
DDX of  difficult airways managment all start with A and  include Adherence, Ace Inhibitors, Acid Reflux, Active Sinus Disease, Alpha 1 Antitripsin deficiency, Anxiety masquerading as Airways dz,  ABPA,  allergy(esp in young), Aspiration (esp in elderly), Adverse effects of DPI,  Active smokers, plus two Bs  = Bronchiectasis and Beta blocker use..and one C= CHF  Adherence is always the initial "prime suspect" and is a multilayered concern that requires a "trust but verify" approach in every patient - starting with knowing how to use medications, especially inhalers, correctly, keeping up with refills and understanding the fundamental difference between maintenance and prns vs those medications only taken for a very short course and then stopped and not refilled. The proper method of use, as well as anticipated side effects, of this metered-dose inhaler are discussed and demonstrated to the patient. Improved to 75% with coaching  ? gerd > rx and diet reviewed  ? Sinus dz, has been probematic in past but no symptoms to suggest at present, with cough more daytime than while supine  See instructions for specific recommendations which were reviewed directly with the patient who was given a copy with highlighter outlining the key components.

## 2010-10-24 ENCOUNTER — Ambulatory Visit (INDEPENDENT_AMBULATORY_CARE_PROVIDER_SITE_OTHER): Payer: BC Managed Care – PPO | Admitting: Internal Medicine

## 2010-10-24 ENCOUNTER — Encounter: Payer: Self-pay | Admitting: Internal Medicine

## 2010-10-24 DIAGNOSIS — J310 Chronic rhinitis: Secondary | ICD-10-CM | POA: Insufficient documentation

## 2010-10-24 DIAGNOSIS — J45909 Unspecified asthma, uncomplicated: Secondary | ICD-10-CM

## 2010-10-24 LAB — PULMONARY FUNCTION TEST

## 2010-10-24 MED ORDER — AMOXICILLIN-POT CLAVULANATE ER 1000-62.5 MG PO TB12: 1.0000 | ORAL_TABLET | Freq: Two times a day (BID) | ORAL | Status: AC

## 2010-10-24 MED ORDER — PREDNISONE (PAK) 10 MG PO TABS: ORAL_TABLET | ORAL | Status: AC

## 2010-10-24 NOTE — Progress Notes (Signed)
Subjective:     Patient ID: Nathaniel Hartman, male   DOB: [birth year], 60 y.o.   MRN: [id number]  HPI  60 yo vietnamese male quit smoking 1999 with AB adm 10/22/2007 acute resp failure Extubated on 9/11 and discharged 9/14 on symbicort and prednisone taper. Spirometry and walking sats normal after this episode c/w asthma, not copd.  January 10, 2010 ov cough much better not using proair, excellent technique with symbicort, on 5 mg per day. rec Try Prednisone 10 mg one half odd days only, take none on even days.  Remember to keep proaire handy for as needed use   1/26-30/2012 admit for ? aecopd in setting of severe N and V, back to baseline p discharge    March 20, 2010 ov f/u asthma on pred 10 mg one half on even, no change on days he takes vs not taking. rec q 3d pred   April 25, 2010 ov no cough no sob, no need prns. no dicolored sputum.  rec ) Ok to try prednisone every 3rd day and stop at end of month if doing well   09/13/2010 f/u ov/Nathaniel Hartman cc increase cough mostly day, not bothering him once asleep. Minimal use of saba. No purulent sputum. recPrednisone 10 mg take  4 each am x 2 days,   2 each am x 2 days,  1 each am x2days and stop  GERD   Diet       10/24/2010 f/u ov/Nathaniel Hartman cc worse cough  Indolent onset, slt progressive since 2 weeks from last ov(p prednsione rx)> slt yellow mucus.  Pt denies any significant sore throat, dysphagia, itching, sneezing,  nasal congestion or excess/ purulent secretions,  fever, chills, sweats, unintended wt loss, pleuritic or exertional cp, hempoptysis, orthopnea pnd or leg swelling.    Also denies any obvious fluctuation of symptoms with weather or environmental changes or other aggravating or alleviating factors>  rarely using saba but thinks it helps some   ROS  At present neg for  any significant sore throat, dysphagia, itching, sneezing,  nasal congestion or excess/ purulent secretions,  fever, chills, sweats, unintended wt loss, pleuritic or  exertional cp, hempoptysis, orthopnea pnd or leg swelling.  Also denies presyncope, palpitations, heartburn, abdominal pain, nausea, vomiting, diarrhea  or change in bowel or urinary habits, dysuria,hematuria,  rash, arthralgias, visual complaints, headache, numbness weakness or ataxia.      Past Medical History:  1) pneumonia bilateral, diagnosed on July 31, 2007.  2) CT negative for PE - June 2009  3) VDRF -sept 2009. Admitted for 4 days. Intubated for 1 day  4) [facility] Negative - sept 2009 [facility]  ASTHMA -  - HFA 100% effective tecnique September 27, 2009 > 75% October 31, 2009 > 90% November 29, 2009  Chronic cough ? etiology  - no better on qvar and singulair both stopped September 13, 2009 > no worse off September 27, 2009  - Sinus CT 09/14/09 > pansinusitis  - rx 21 days rx started c augment October 25, 2009 >> repeat sinus ct:neg 11/15/09  - Daily prednisone rx October 31, 2009 >>> try qod dosing January 10, 2010 > tapered off completely 05/2010 Health Maintenance  - Pneumovax given October 31, 2009  COMPLEX MED REGIMEN-Meds reviewed with pt education and computerized med calendar completed 11/14/09           Objective:   Physical Exam amb vietnamese male limited English, nad  wt 175 September 27, 2009 > 186 April 27, 2010 >  182 09/13/2010 > 171 10/24/2010  HEENT: [place]/AT, , EACs-clear, TMs-wnl, NOSE-pale , THROAT-clear, no exudate  NECK: Supple w/ fair ROM; no JVD; normal carotid impulses w/o bruits; no thyromegaly or nodules palpated; no lymphadenopathy.  RESP clear to A and P  CARD: RRR, no m/r/g  GI: Soft & nt; nml bowel sounds; no organomegaly or masses detected.  Musco: Warm bil, no calf tenderness edema, clubbing, pulses int    Assessment:         Plan:

## 2010-10-24 NOTE — Patient Instructions (Addendum)
Augmentin twice daily x 2 weeks  Prednisone 10 mg take  4 each am x 2 days,   2 each am x 2 days,  1 each am x2days and stop   See Tammy NP w/in 2 weeks with all your medications, even over the counter meds, separated in two separate bags, the ones you take no matter what vs the ones you stop once you feel better and take only as needed when you feel you need them.   Tammy  will generate for you a new user friendly medication calendar that will put [place] all on the same page re: your medication use.     Without this process, it simply isn't possible to assure that we are providing  your outpatient care  with  the attention to detail we feel you deserve.   If we cannot assure that you're getting that kind of care,  then we cannot manage your problem effectively from this clinic.  Once you have seen Tammy and we are sure that we're all on the same page with your medication use she will arrange follow up with me.   IF NOT BETTER NEXT STEP SINUS CT/ent eval

## 2010-10-24 NOTE — Progress Notes (Signed)
PFT done today. 

## 2010-10-27 ENCOUNTER — Encounter: Payer: Self-pay | Admitting: Internal Medicine

## 2010-10-27 NOTE — Assessment & Plan Note (Signed)
See instructions for specific recommendations which were reviewed directly with the patient who was given a copy with highlighter outlining the key components.  

## 2010-10-27 NOTE — Assessment & Plan Note (Signed)
DDX of  difficult airways managment all start with A and  include Adherence, Ace Inhibitors, Acid Reflux, Active Sinus Disease, Alpha 1 Antitripsin deficiency, Anxiety masquerading as Airways dz,  ABPA,  allergy(esp in young), Aspiration (esp in elderly), Adverse effects of DPI,  Active smokers, plus two Bs  = Bronchiectasis and Beta blocker use..and one C= CHF  Adherence is always the initial "prime suspect" and is a multilayered concern that requires a "trust but verify" approach in every patient - starting with knowing how to use medications, especially inhalers, correctly, keeping up with refills and understanding the fundamental difference between maintenance and prns vs those medications only taken for a very short course and then stopped and not refilled. Seems better with use of med calendar. The proper method of use, as well as anticipated side effects, of this metered-dose inhaler are discussed and demonstrated to the patient. impoved to 90%  ? Recurrent sinusitis > rx x 2 weeks then repeat sinus ct if not better

## 2010-10-31 ENCOUNTER — Telehealth: Payer: Self-pay | Admitting: Adult Health

## 2010-10-31 NOTE — Telephone Encounter (Signed)
I spoke with Christen Bame and he states that Tammy had called and spoke with pt and he did not understand what tammy was telling him. I don't see where Babette Relic has tried to call patient. Christen Bame states pt has an apt with TP 11/07/10 but I see that this has been cancelled and it states by provider. Please advise Tammy if you called pt and if he needs to come in for an apt. Thanks  Carver Fila, CMA

## 2010-10-31 NOTE — Telephone Encounter (Signed)
Per Tammy she did not call pt. The schedulers were trying to call pt to reschedule his apt. Pt is coming in 10/3 for med calendar with TP

## 2010-11-07 ENCOUNTER — Encounter: Payer: BC Managed Care – PPO | Admitting: Adult Health

## 2010-11-08 LAB — LIPID PANEL
HDL: 35 — ABNORMAL LOW
Triglycerides: 33
VLDL: 7

## 2010-11-08 LAB — DIFFERENTIAL
Basophils Absolute: 0
Basophils Absolute: 0
Basophils Relative: 0
Basophils Relative: 1
Eosinophils Absolute: 0.2
Eosinophils Absolute: 1.3 — ABNORMAL HIGH
Eosinophils Relative: 0
Eosinophils Relative: 15 — ABNORMAL HIGH
Eosinophils Relative: 2
Lymphocytes Relative: 12
Lymphocytes Relative: 18
Lymphocytes Relative: 39
Lymphs Abs: 0.8
Lymphs Abs: 1.5
Lymphs Abs: 3.4
Monocytes Absolute: 0.3
Monocytes Absolute: 0.4
Monocytes Absolute: 0.5
Monocytes Relative: 5
Monocytes Relative: 6
Neutro Abs: 3.5
Neutro Abs: 6.3
Neutrophils Relative %: 41 — ABNORMAL LOW
Neutrophils Relative %: 74

## 2010-11-08 LAB — CBC
HCT: 41.3
HCT: 41.3
HCT: 44.1
Hemoglobin: 13.4
Hemoglobin: 13.8
Hemoglobin: 14.8
MCHC: 32.6
MCV: 82.6
MCV: 82.8
Platelets: 166
Platelets: 199
RBC: 4.98
RBC: 4.98
RDW: 15.4
RDW: 15.5
WBC: 6.8
WBC: 8.5

## 2010-11-08 LAB — MISCELLANEOUS TEST

## 2010-11-08 LAB — CULTURE, BLOOD (ROUTINE X 2): Culture: NO GROWTH

## 2010-11-08 LAB — BASIC METABOLIC PANEL
GFR calc Af Amer: >60
GFR calc non Af Amer: >60
GFR calc non Af Amer: >60
Glucose, Bld: 125 — ABNORMAL HIGH
Potassium: 3.6
Potassium: 3.8
Sodium: 138
Sodium: 139

## 2010-11-08 LAB — D-DIMER, QUANTITATIVE: D-Dimer, Quant: >20 — ABNORMAL HIGH

## 2010-11-08 LAB — URINALYSIS, ROUTINE W REFLEX MICROSCOPIC
Glucose, UA: NEGATIVE
Hgb urine dipstick: NEGATIVE
Ketones, ur: 15 — AB
Nitrite: NEGATIVE

## 2010-11-08 LAB — POCT I-STAT, CHEM 8
BUN: 13
Calcium, Ion: 1.1 — ABNORMAL LOW
Chloride: 105
Creatinine, Ser: 1.2
Glucose, Bld: 117 — ABNORMAL HIGH
HCT: 47
Hemoglobin: 16
Potassium: 3.9
Sodium: 140
TCO2: 28

## 2010-11-08 LAB — BASIC METABOLIC PANEL WITH GFR
BUN: 17
CO2: 24
Calcium: 8.5
Chloride: 107
Creatinine, Ser: 1.15

## 2010-11-08 LAB — PROTIME-INR: Prothrombin Time: 13.7

## 2010-11-08 LAB — HIV ANTIBODY (ROUTINE TESTING W REFLEX): HIV: NONREACTIVE

## 2010-11-12 ENCOUNTER — Encounter: Payer: Self-pay | Admitting: Internal Medicine

## 2010-11-12 LAB — CBC
HCT: 44.5
Platelets: 173
RBC: 4.77
RDW: 14.8
WBC: 8.9

## 2010-11-12 LAB — POCT CARDIAC MARKERS: Myoglobin, poc: 103

## 2010-11-12 LAB — DIFFERENTIAL
Basophils Absolute: 0
Eosinophils Relative: 19 — ABNORMAL HIGH
Lymphocytes Relative: 30
Neutro Abs: 4
Neutrophils Relative %: 43

## 2010-11-12 LAB — COMPREHENSIVE METABOLIC PANEL
ALT: 14
AST: 18
CO2: 25
Chloride: 106
GFR calc Af Amer: >60
GFR calc non Af Amer: >60
Potassium: 4.2
Sodium: 138
Total Bilirubin: 0.5

## 2010-11-12 LAB — URINE MICROSCOPIC-ADD ON

## 2010-11-12 LAB — CARDIAC PANEL(CRET KIN+CKTOT+MB+TROPI)
CK, MB: 3.2
Relative Index: 2.3
Total CK: 123
Troponin I: 0.02

## 2010-11-12 LAB — URINALYSIS, ROUTINE W REFLEX MICROSCOPIC
Glucose, UA: NEGATIVE
Leukocytes, UA: NEGATIVE
Nitrite: NEGATIVE
Specific Gravity, Urine: 1.016
pH: 6

## 2010-11-12 LAB — GLUCOSE, CAPILLARY
Glucose-Capillary: 138 — ABNORMAL HIGH
Glucose-Capillary: 147 — ABNORMAL HIGH
Glucose-Capillary: 147 — ABNORMAL HIGH
Glucose-Capillary: 90

## 2010-11-12 LAB — POCT I-STAT 3, ART BLOOD GAS (G3+)
Patient temperature: 98
TCO2: 28
pH, Arterial: 7.379

## 2010-11-12 LAB — BASIC METABOLIC PANEL
BUN: 17
Calcium: 8.4
Creatinine, Ser: 1.08
GFR calc non Af Amer: >60
Glucose, Bld: 112 — ABNORMAL HIGH

## 2010-11-12 LAB — CK TOTAL AND CKMB (NOT AT ARMC): Total CK: 196

## 2010-11-12 LAB — TSH: TSH: 1.016

## 2010-11-14 ENCOUNTER — Encounter: Payer: Self-pay | Admitting: Adult Health

## 2010-11-14 ENCOUNTER — Ambulatory Visit (INDEPENDENT_AMBULATORY_CARE_PROVIDER_SITE_OTHER): Payer: BC Managed Care – PPO | Admitting: Adult Health

## 2010-11-14 VITALS — BP 132/72 | HR 87 | Temp 96.5°F | Ht 74.0 in | Wt 170.2 lb

## 2010-11-14 DIAGNOSIS — Z23 Encounter for immunization: Secondary | ICD-10-CM

## 2010-11-14 DIAGNOSIS — J45909 Unspecified asthma, uncomplicated: Secondary | ICD-10-CM

## 2010-11-14 LAB — BASIC METABOLIC PANEL
BUN: 18
CO2: 28
Calcium: 8.5
Calcium: 8.6
Creatinine, Ser: 0.89
GFR calc non Af Amer: >60
Glucose, Bld: 115 — ABNORMAL HIGH
Glucose, Bld: 119 — ABNORMAL HIGH
Sodium: 139

## 2010-11-14 LAB — POCT I-STAT, CHEM 8
BUN: 17
Calcium, Ion: 1.15
Chloride: 107
HCT: 44
Sodium: 140
TCO2: 28

## 2010-11-14 LAB — COMPREHENSIVE METABOLIC PANEL
ALT: 17
AST: 29
Alkaline Phosphatase: 63
CO2: 27
Chloride: 107
Creatinine, Ser: 1.09
GFR calc Af Amer: >60
GFR calc non Af Amer: >60
Potassium: 3.7
Total Bilirubin: 1.1

## 2010-11-14 LAB — URINALYSIS, ROUTINE W REFLEX MICROSCOPIC
Bilirubin Urine: NEGATIVE
Glucose, UA: 100 — AB
Ketones, ur: NEGATIVE
Protein, ur: 100 — AB
pH: 6

## 2010-11-14 LAB — POCT CARDIAC MARKERS
CKMB, poc: 3.5
Myoglobin, poc: 68.1

## 2010-11-14 LAB — POCT I-STAT 3, ART BLOOD GAS (G3+)
Acid-base deficit: 2
O2 Saturation: 98
TCO2: 25
pCO2 arterial: 43.5
pO2, Arterial: 112 — ABNORMAL HIGH

## 2010-11-14 LAB — DIFFERENTIAL
Basophils Absolute: 0
Basophils Relative: 1
Eosinophils Absolute: 1.1 — ABNORMAL HIGH
Eosinophils Relative: 11 — ABNORMAL HIGH
Lymphocytes Relative: 55 — ABNORMAL HIGH
Monocytes Absolute: 0.7

## 2010-11-14 LAB — GLUCOSE, CAPILLARY
Glucose-Capillary: 101 — ABNORMAL HIGH
Glucose-Capillary: 108 — ABNORMAL HIGH
Glucose-Capillary: 110 — ABNORMAL HIGH
Glucose-Capillary: 114 — ABNORMAL HIGH
Glucose-Capillary: 125 — ABNORMAL HIGH
Glucose-Capillary: 126 — ABNORMAL HIGH
Glucose-Capillary: 128 — ABNORMAL HIGH
Glucose-Capillary: 153 — ABNORMAL HIGH
Glucose-Capillary: 168 — ABNORMAL HIGH

## 2010-11-14 LAB — CBC
Hemoglobin: 12.9 — ABNORMAL LOW
MCHC: 33.1
MCV: 85.7
Platelets: 162
RBC: 4.87
RDW: 15.2
RDW: 15.5
WBC: 10.1

## 2010-11-14 LAB — CULTURE, BAL-QUANTITATIVE W GRAM STAIN
Colony Count: NO GROWTH
Culture: NO GROWTH

## 2010-11-14 LAB — BLOOD GAS, ARTERIAL
Acid-base deficit: 2.2 — ABNORMAL HIGH
Drawn by: 244801
FIO2: 0.4
MECHVT: 550
O2 Saturation: 99.3
PEEP: 5
Patient temperature: 98.6
RATE: 12

## 2010-11-14 LAB — URINE MICROSCOPIC-ADD ON

## 2010-11-14 LAB — STREP PNEUMONIAE URINARY ANTIGEN: Strep Pneumo Urinary Antigen: NEGATIVE

## 2010-11-14 LAB — D-DIMER, QUANTITATIVE: D-Dimer, Quant: 0.86 — ABNORMAL HIGH

## 2010-11-14 LAB — LEGIONELLA ANTIGEN, URINE

## 2010-11-14 LAB — T4, FREE: Free T4: 1.12

## 2010-11-14 LAB — T3, FREE: T3, Free: 3.1 (ref 2.3–4.2)

## 2010-11-14 MED ORDER — ALBUTEROL SULFATE HFA 108 (90 BASE) MCG/ACT IN AERS: 2.0000 | INHALATION_SPRAY | Freq: Four times a day (QID) | RESPIRATORY_TRACT | Status: DC | PRN

## 2010-11-14 MED ORDER — BUDESONIDE-FORMOTEROL FUMARATE 160-4.5 MCG/ACT IN AERO: 2.0000 | INHALATION_SPRAY | Freq: Two times a day (BID) | RESPIRATORY_TRACT | Status: DC

## 2010-11-14 MED ORDER — TRAMADOL HCL 50 MG PO TABS: ORAL_TABLET | ORAL | Status: DC

## 2010-11-14 NOTE — Progress Notes (Signed)
Subjective:     Patient ID: Nathaniel Hartman, male   DOB: 1950/06/13, 60 y.o.   MRN: [id number]  HPI  60 yo vietnamese male quit smoking 1999 with AB adm 10/22/2007 acute resp failure Extubated on 9/11 and discharged 9/14 on symbicort and prednisone taper. Spirometry and walking sats normal after this episode c/w asthma, not copd.  January 10, 2010 ov cough much better not using proair, excellent technique with symbicort, on 5 mg per day. rec Try Prednisone 10 mg one half odd days only, take none on even days.  Remember to keep proaire handy for as needed use   1/26-30/2012 admit for ? aecopd in setting of severe N and V, back to baseline p discharge    March 20, 2010 ov f/u asthma on pred 10 mg one half on even, no change on days he takes vs not taking. rec q 3d pred   April 25, 2010 ov no cough no sob, no need prns. no dicolored sputum.  rec ) Ok to try prednisone every 3rd day and stop at end of month if doing well   09/13/2010 f/u ov/Wert cc increase cough mostly day, not bothering him once asleep. Minimal use of saba. No purulent sputum. recPrednisone 10 mg take  4 each am x 2 days,   2 each am x 2 days,  1 each am x2days and stop  GERD   Diet   10/24/2010 f/u ov/Wert cc worse cough  Indolent onset, slt progressive since 2 weeks from last ov(p prednsione rx)> slt yellow mucus. >>Augmentin x 10 days   11/14/2010 Follow up and Med review  Pt returns for follow up and med review. Since last OV he is doing well. Has occasional cough. On increased SABA use . No ER /Hospital visit or nocturnal awakienings.  We reviewed his med list and updated his med calendar w/ pt education  No chest pain , fever or discolored mucus.       Past Medical History:  1) pneumonia bilateral, diagnosed on July 31, 2007.  2) CT negative for PE - June 2009  3) VDRF -sept 2009. Admitted for 4 days. Intubated for 1 day  4) [facility] Negative - sept 2009 [facility]  ASTHMA -  - HFA 100% effective tecnique September 27, 2009 > 75% October 31, 2009 > 90% November 29, 2009  Chronic cough ? etiology  - no better on qvar and singulair both stopped September 13, 2009 > no worse off September 27, 2009  - Sinus CT 09/14/09 > pansinusitis  - rx 21 days rx started c augment October 25, 2009 >> repeat sinus ct:neg 11/15/09  - Daily prednisone rx October 31, 2009 >>> try qod dosing January 10, 2010 > tapered off completely 05/2010 Health Maintenance  - Pneumovax given October 31, 2009  COMPLEX MED REGIMEN-Meds reviewed with pt education and computerized med calendar completed 11/14/09 , 11/14/2010           Objective:   Physical Exam amb vietnamese male limited English, nad  wt 175 September 27, 2009 > 186 April 27, 2010 >  182 09/13/2010 > 171 10/24/2010 >>170 11/14/2010  HEENT: /AT, , EACs-clear, TMs-wnl, NOSE-pale , THROAT-clear, no exudate  NECK: Supple w/ fair ROM; no JVD; normal carotid impulses w/o bruits; no thyromegaly or nodules palpated; no lymphadenopathy.  RESP clear to A and P  CARD: RRR, no m/r/g  GI: Soft & nt; nml bowel sounds; no organomegaly or masses detected.  Musco: Warm bil,  no calf tenderness edema, clubbing, pulses int    Assessment:         Plan:

## 2010-11-14 NOTE — Patient Instructions (Signed)
Follow med calendar closely and bring to each visit.  Flu shot today follow up Dr. Sherene Sires  In 2 months and As needed

## 2010-11-21 NOTE — Assessment & Plan Note (Signed)
Well compensated on present regimen  Patient's medications were reviewed today and patient education was given. Computerized medication calendar was adjusted/completed Flu shot today

## 2010-11-27 LAB — COMPREHENSIVE METABOLIC PANEL
AST: 24
Albumin: 3.5
Chloride: 104
Creatinine, Ser: 1
GFR calc Af Amer: >60
Potassium: 4.2
Total Bilirubin: 0.6
Total Protein: 7.2

## 2010-11-27 LAB — CBC
MCV: 81.8
Platelets: 192
RDW: 14.8 — ABNORMAL HIGH
WBC: 6.4

## 2010-12-21 ENCOUNTER — Telehealth: Payer: Self-pay | Admitting: Internal Medicine

## 2010-12-21 NOTE — Telephone Encounter (Signed)
lmomtcb for Nathaniel Hartman.

## 2010-12-21 NOTE — Telephone Encounter (Signed)
lmomtcb for pts son ronnie.

## 2010-12-21 NOTE — Telephone Encounter (Signed)
Pt's son ronnie returned call. Hazel Sams

## 2010-12-24 NOTE — Telephone Encounter (Signed)
Spoke with [employer]. He states that symbiocort is all of the sudden a lot more expensive and asked if we still have coupon for this. I advised that we do, but it has already expired- so will leave samples of med up front for pick up and advised Ronnie to have the pt obtain his drug formulary list and call us back with any cheaper alternatives and we can talk with MW about maybe switching. He verbalized understanding and is okay with this plan.

## 2011-02-01 ENCOUNTER — Encounter: Payer: Self-pay | Admitting: Internal Medicine

## 2011-02-01 ENCOUNTER — Ambulatory Visit (INDEPENDENT_AMBULATORY_CARE_PROVIDER_SITE_OTHER): Payer: BC Managed Care – PPO | Admitting: Internal Medicine

## 2011-02-01 VITALS — BP 120/86 | HR 74 | Temp 98.4°F | Ht 74.0 in | Wt 170.0 lb

## 2011-02-01 DIAGNOSIS — J45909 Unspecified asthma, uncomplicated: Secondary | ICD-10-CM

## 2011-02-01 MED ORDER — BUDESONIDE-FORMOTEROL FUMARATE 160-4.5 MCG/ACT IN AERO: INHALATION_SPRAY | RESPIRATORY_TRACT | Status: DC

## 2011-02-01 MED ORDER — ALBUTEROL SULFATE HFA 108 (90 BASE) MCG/ACT IN AERS: 2.0000 | INHALATION_SPRAY | RESPIRATORY_TRACT | Status: DC | PRN

## 2011-02-01 NOTE — Progress Notes (Signed)
Subjective:     Patient ID: Nathaniel Hartman, male   DOB: Mar 05, 1950, 60 y.o.   MRN: [id number]  HPI  60 yo vietnamese male quit smoking 1999 with AB adm 10/22/2007 acute resp failure extubated on 9/11 and discharged 9/14 on symbicort and prednisone taper. Spirometry and walking sats normal after this episode c/w asthma, not copd.  January 10, 2010 ov cough much better not using proair, excellent technique with symbicort, on 5 mg per day. rec Try Prednisone 10 mg one half odd days only, take none on even days.  Remember to keep proaire handy for as needed use   1/26-30/2012 admit for ? aecopd in setting of severe N and V, back to baseline p discharge    March 20, 2010 ov f/u asthma on pred 10 mg one half on even, no change on days he takes vs not taking. rec q 3d pred   April 25, 2010 ov no cough no sob, no need prns. no dicolored sputum.  rec ) Ok to try prednisone every 3rd day and stop at end of month if doing well   09/13/2010 f/u ov/Nathaniel Hartman cc increase cough mostly day, not bothering him once asleep. Minimal use of saba. No purulent sputum. recPrednisone 10 mg take  4 each am x 2 days,   2 each am x 2 days,  1 each am x2days and stop  GERD   Diet   10/24/2010 f/u ov/Nathaniel Hartman cc worse cough  Indolent onset, slt progressive since 2 weeks from last ov(p prednsione rx)> slt yellow mucus. >>Augmentin x 10 days   11/14/2010 Follow up and Med review  Pt returns for follow up and med review. Since last OV he is doing well. Has occasional cough. On increased SABA use . No ER /Hospital visit or nocturnal awakenings.  We reviewed his med list and updated his med calendar w/ pt education  rec Follow med calendar closely and bring to each visit.  Flu shot today   02/01/2011 f/u ov/Nathaniel Hartman did not bring the med calendar out of symbicort x one month cc increase sob > needing  proaire a lot more than he was esp in afternoons, evenings, most of his problems though are related to nasal drainage, watery, not  purulent.   Sleeping ok without nocturnal  or early am exacerbation  of respiratory  c/o's or need for noct saba. Also denies any obvious fluctuation of symptoms with weather or environmental changes or other aggravating or alleviating factors except as outlined above  ROS  At present neg for  any significant sore throat, dysphagia, fever, chills, sweats, unintended wt loss, pleuritic or exertional cp, hempoptysis, orthopnea pnd or leg swelling.  Also denies presyncope, palpitations, heartburn, abdominal pain, nausea, vomiting, diarrhea  or change in bowel or urinary habits, dysuria,hematuria,  rash, arthralgias, visual complaints, headache, numbness weakness or ataxia.        Past Medical History:  1) pneumonia bilateral, diagnosed on July 31, 2007.  2) CT negative for PE - June 2009  3) VDRF -sept 2009. Admitted for 4 days. Intubated for 1 day  4) HIV Negative - sept 2009 [facility]  ASTHMA -  - HFA 100% effective tecnique September 27, 2009 > 75% October 31, 2009 > 90% November 29, 2009  Chronic cough ? etiology  - no better on qvar and singulair both stopped September 13, 2009 > no worse off September 27, 2009  - Sinus CT 09/14/09 > pansinusitis  - rx 21 days rx started  c augment October 25, 2009 >> repeat sinus ct:neg 11/15/09  - Daily prednisone rx October 31, 2009 >>> try qod dosing January 10, 2010 > tapered off completely 05/2010 Health Maintenance  - Pneumovax given October 31, 2009  COMPLEX MED REGIMEN-Meds reviewed with pt education and computerized med calendar completed 11/14/09 , 11/14/2010           Objective:   Physical Exam amb vietnamese male limited English, nad  wt 175 September 27, 2009 > 186 April 27, 2010 >    >>170 11/14/2010 >  170 02/01/2011  HEENT: [place]/AT, , EACs-clear, TMs-wnl, NOSE-pale , THROAT-clear, no exudate  NECK: Supple w/ fair ROM; no JVD; normal carotid impulses w/o bruits; no thyromegaly or nodules palpated; no lymphadenopathy.  RESP clear to A and P    CARD: RRR, no m/r/g  GI: Soft & nt; nml bowel sounds; no organomegaly or masses detected.  Musco: Warm bil, no calf tenderness edema, clubbing, pulses int    Assessment:         Plan:

## 2011-02-01 NOTE — Patient Instructions (Addendum)
If there is a less expensive alternative to symbicort on [employer] plan (called formulary) please let me know  Only use your albuterol (proaire) as a rescue medication to be used if you can't catch your breath by resting or doing a relaxed purse lip breathing pattern. The less you use it, the better it will work when you need it.  The goal is to need it less than twice weekly  Please schedule a follow up visit in 3 months but call sooner if needed

## 2011-02-01 NOTE — Assessment & Plan Note (Signed)
DDX of  difficult airways managment all start with A and  include Adherence, Ace Inhibitors, Acid Reflux, Active Sinus Disease, Alpha 1 Antitripsin deficiency, Anxiety masquerading as Airways dz,  ABPA,  allergy(esp in young), Aspiration (esp in elderly), Adverse effects of DPI,  Active smokers, plus two Bs  = Bronchiectasis and Beta blocker use..and one C= CHF  In this case Adherence is the biggest issue and starts with lack of   Understanding  that SABA treats the symptoms but doesn't get to the underlying problem (inflammation).  I used  the analogy of putting steroid cream on a rash to help explain the meaning of topical therapy and the need to get the drug to the target tissue.  The proper method of use, as well as anticipated side effects, of this metered-dose inhaler are discussed and demonstrated to the patient. Improved to 90% with coaching.  Note not using med calendar and language barrier also an issue but son helping with this  ? Active sinus dz > watery drainage only at this point, ok to use just zyrtec prn

## 2011-04-23 IMAGING — CT CT PARANASAL SINUSES LIMITED
1 of 2 series · 16 of 25 positions shown, 20 images · non-contrast
Comparison: [DATE]

CLINICAL DATA: Cough, chronic sinusitis despite antibiotic therapy

CT PARANASAL SINUS LIMITED WITHOUT CONTRAST
TECHNIQUE: Multidetector CT images of the paranasal sinuses were
obtained in a single plane without contrast.

[Series 4: ltd sinus 3.0 h30s · axial · 0.29mm/px · z∈[-92,+6]mm · 16 of 24 slices shown, 20 images]
[im 2/24  brain]
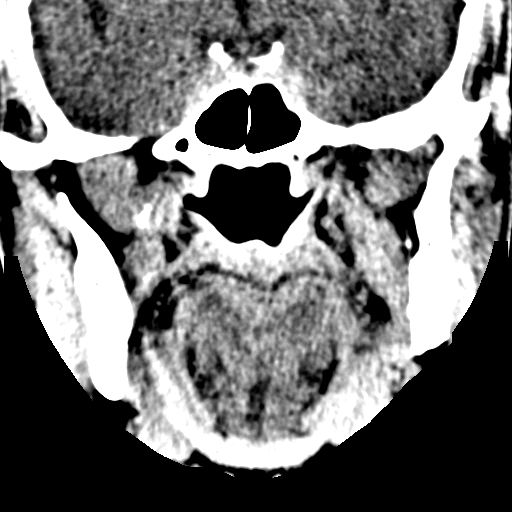
[im 2/24  bone]
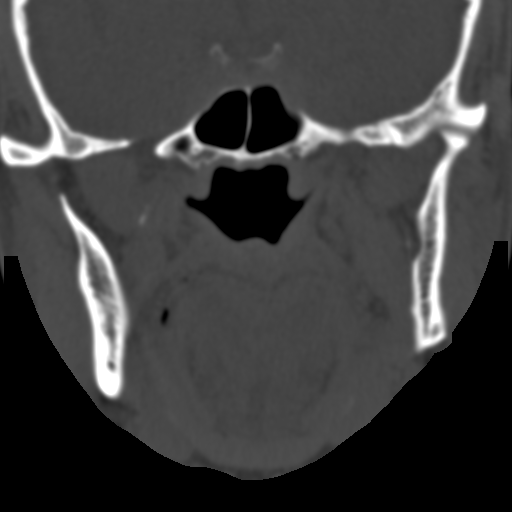
[im 4/24  bone]
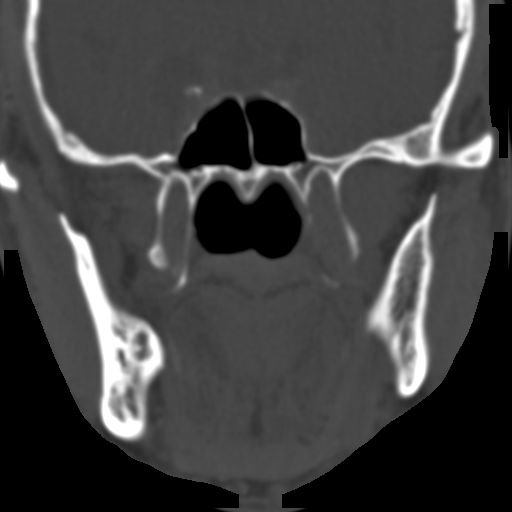
[im 5/24  bone]
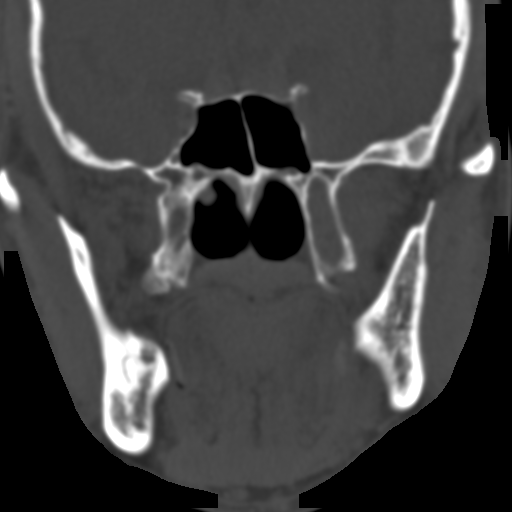
[im 6/24  bone]
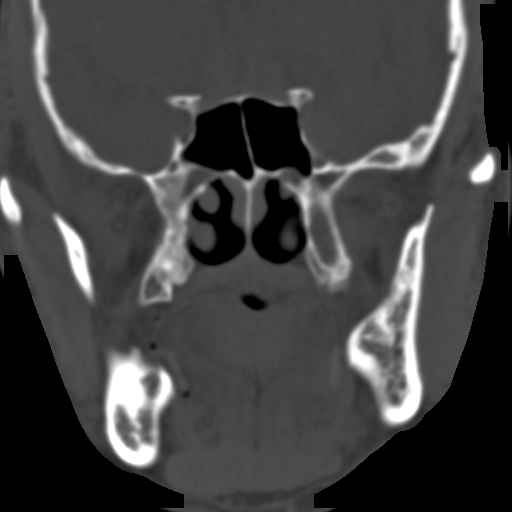
[im 8/24  brain]
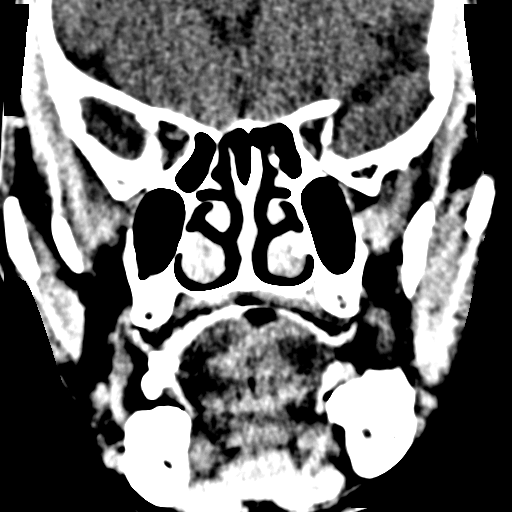
[im 8/24  bone]
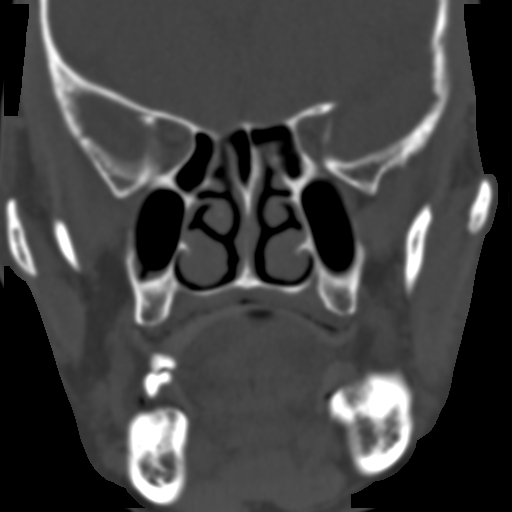
[im 9/24  bone]
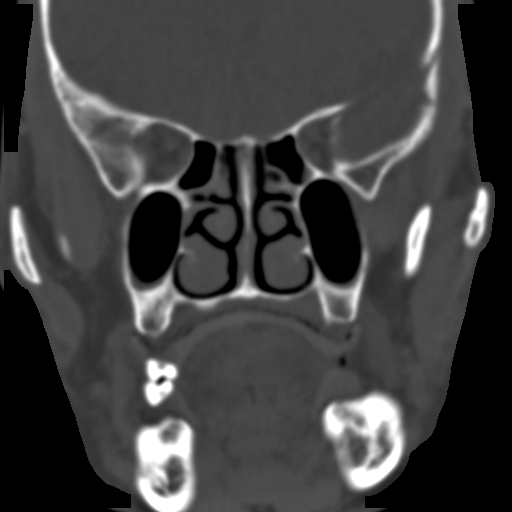
[im 10/24  bone]
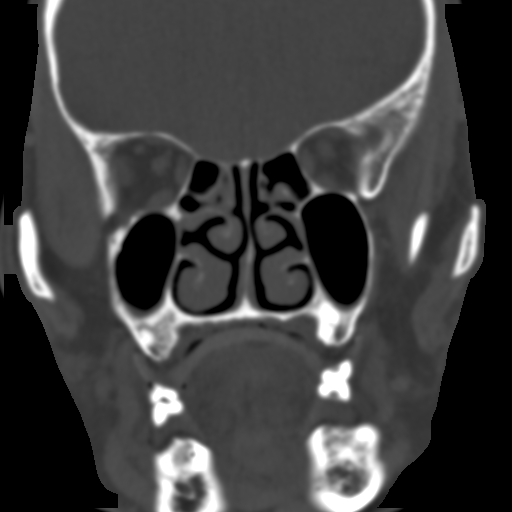
[im 12/24  bone]
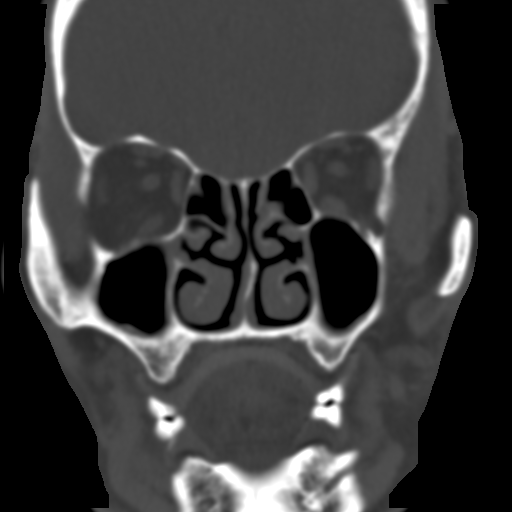
[im 13/24  brain]
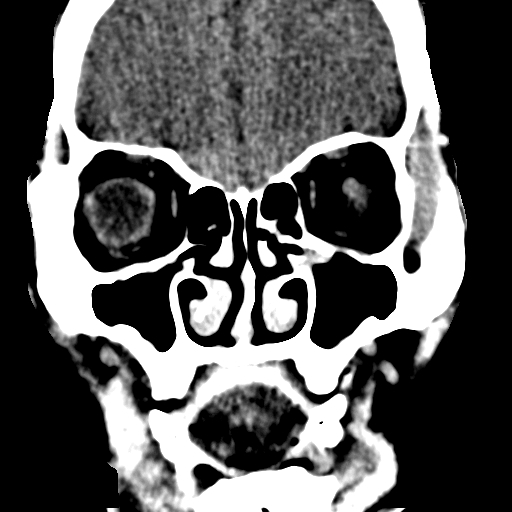
[im 13/24  bone]
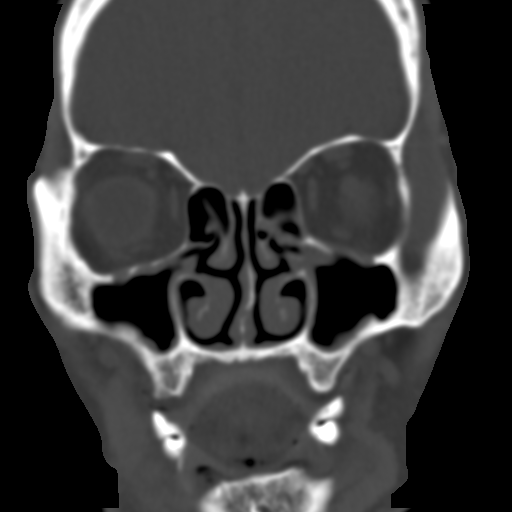
[im 15/24  bone]
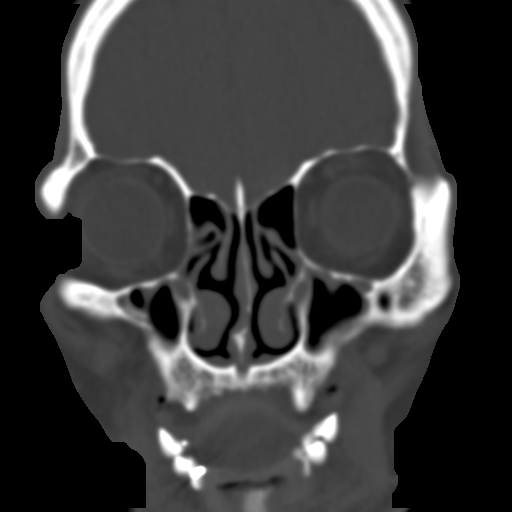
[im 16/24  bone]
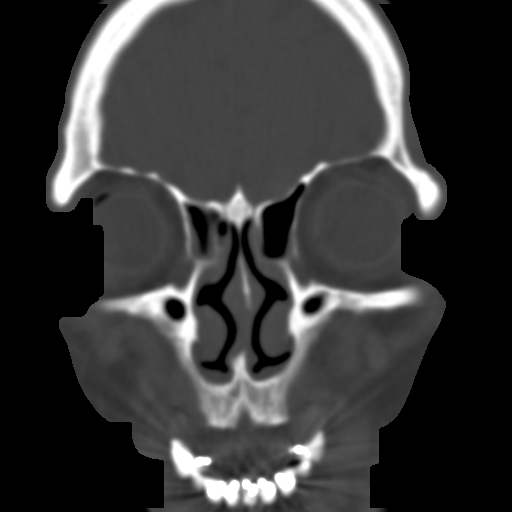
[im 17/24  bone]
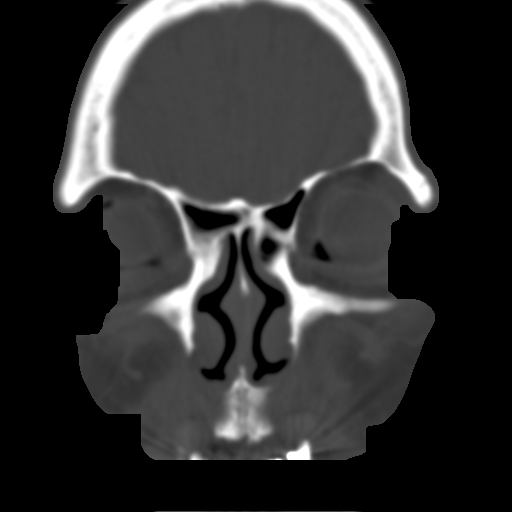
[im 19/24  brain]
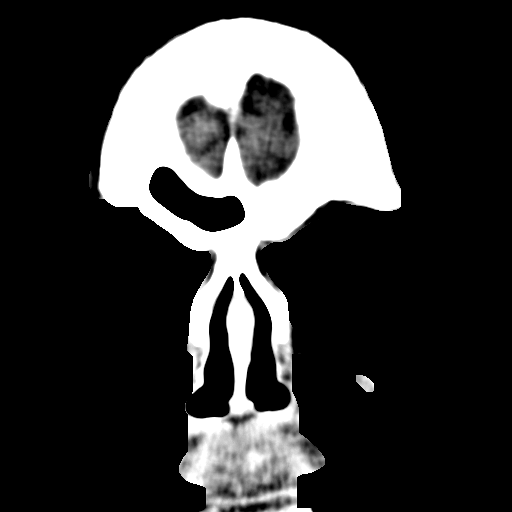
[im 19/24  bone]
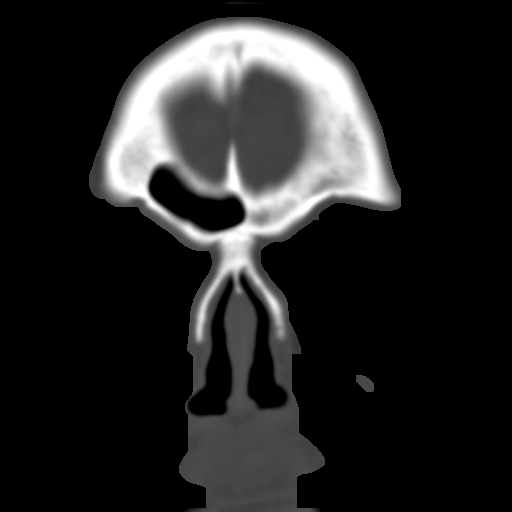
[im 20/24  bone]
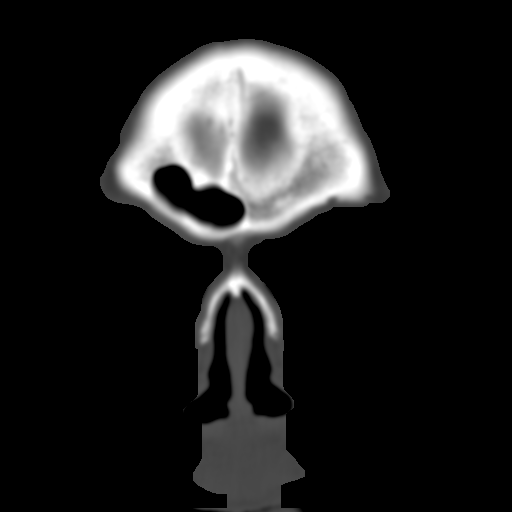
[im 21/24  bone]
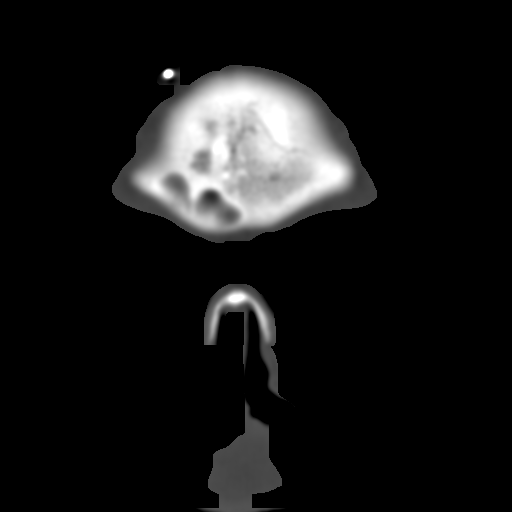
[im 23/24  bone]
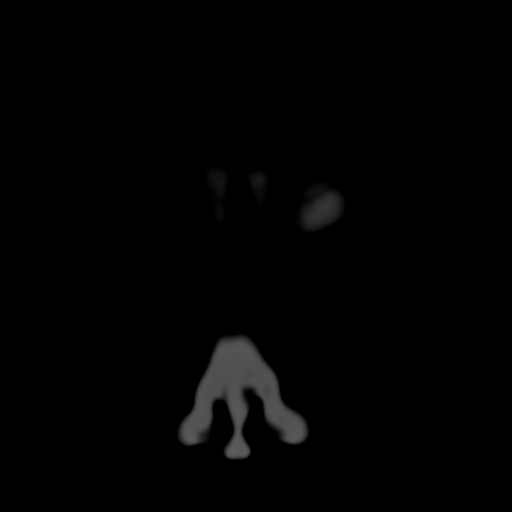

[16 of 25 positions shown; findings below may reference images not displayed]

FINDINGS: Significant interval improvement in the diffuse mucosal
thickening and resolution of the maxillary and sphenoid air fluid
levels.  There is residual very mild mucosal thickening in the
right frontal sinus, and the maxillary sinuses.  Ethmoid and
sphenoid sinuses have cleared.  Ostiomeatal complex anatomy is
symmetric and patent.  Symmetric turbinates.  Relatively midline
septum, slight deviation to the left.  No orbital abnormality.
IMPRESSION: Resolved pansinusitis.  Minimal residual right frontal and
bilateral maxillary mucosal thickening.

## 2011-04-27 ENCOUNTER — Emergency Department (HOSPITAL_COMMUNITY)
Admission: EM | Admit: 2011-04-27 | Discharge: 2011-04-27 | Disposition: A | Discharge status: Home / Self Care | Payer: BC Managed Care – PPO | Attending: Emergency Medicine | Admitting: Emergency Medicine

## 2011-04-27 ENCOUNTER — Encounter (HOSPITAL_COMMUNITY): Payer: Self-pay

## 2011-04-27 DIAGNOSIS — T7840XA Allergy, unspecified, initial encounter: Secondary | ICD-10-CM

## 2011-04-27 DIAGNOSIS — Z79899 Other long term (current) drug therapy: Secondary | ICD-10-CM | POA: Insufficient documentation

## 2011-04-27 DIAGNOSIS — I10 Essential (primary) hypertension: Secondary | ICD-10-CM | POA: Insufficient documentation

## 2011-04-27 DIAGNOSIS — J45909 Unspecified asthma, uncomplicated: Secondary | ICD-10-CM | POA: Insufficient documentation

## 2011-04-27 DIAGNOSIS — T783XXA Angioneurotic edema, initial encounter: Secondary | ICD-10-CM | POA: Insufficient documentation

## 2011-04-27 DIAGNOSIS — R220 Localized swelling, mass and lump, head: Secondary | ICD-10-CM | POA: Insufficient documentation

## 2011-04-27 DIAGNOSIS — R0602 Shortness of breath: Secondary | ICD-10-CM | POA: Insufficient documentation

## 2011-04-27 MED ORDER — RANITIDINE HCL 150 MG PO CAPS: 150.0000 mg | ORAL_CAPSULE | Freq: Every day | ORAL | Status: AC

## 2011-04-27 MED ORDER — ALBUTEROL SULFATE (5 MG/ML) 0.5% IN NEBU
2.5000 mg | INHALATION_SOLUTION | Freq: Once | RESPIRATORY_TRACT | Status: AC
  Administered 2011-04-27: 2.5 mg via RESPIRATORY_TRACT
  Filled 2011-04-27: qty 0.5

## 2011-04-27 MED ORDER — DIPHENHYDRAMINE HCL 50 MG/ML IJ SOLN
INTRAMUSCULAR | Status: AC
  Administered 2011-04-27: 18:00:00
  Filled 2011-04-27: qty 1

## 2011-04-27 MED ORDER — SODIUM CHLORIDE 0.9 % IV BOLUS (SEPSIS)
1000.0000 mL | Freq: Once | INTRAVENOUS | Status: AC
  Administered 2011-04-27: 1000 mL via INTRAVENOUS

## 2011-04-27 MED ORDER — PREDNISONE 20 MG PO TABS: 60.0000 mg | ORAL_TABLET | Freq: Every day | ORAL | Status: AC

## 2011-04-27 MED ORDER — DIPHENHYDRAMINE HCL 25 MG PO TABS: 25.0000 mg | ORAL_TABLET | Freq: Four times a day (QID) | ORAL | Status: AC

## 2011-04-27 MED ORDER — ALBUTEROL SULFATE (5 MG/ML) 0.5% IN NEBU
INHALATION_SOLUTION | RESPIRATORY_TRACT | Status: AC
  Administered 2011-04-27: 18:00:00
  Filled 2011-04-27: qty 1

## 2011-04-27 MED ORDER — METHYLPREDNISOLONE SODIUM SUCC 125 MG IJ SOLR
INTRAMUSCULAR | Status: AC
  Administered 2011-04-27: 18:00:00
  Filled 2011-04-27: qty 2

## 2011-04-27 NOTE — ED Notes (Signed)
Pt states he took Aleve at 1500 and began to have trouble breathing around 1700.  Pt reports chest tightness.  Per EMS, family reports pt has had allergic reactions before and had to be intubated.  EMS gave the following meds:  50mg  Benadryl IM, 50mg  Zantac PO, 5mg  Albuterol neb, 125mg  Solu-Medrol IV.  Pt still with some expiratory wheezing, O2 sat 96% on 2L [place].

## 2011-04-27 NOTE — ED Notes (Signed)
Pt d/c home in NAD. Pt and family voiced understanding of d/c instructions. Pt instructed not to take medications in NSAID class.

## 2011-04-27 NOTE — ED Notes (Signed)
While ambulating pt, pt stated "Oh I feel normal again." Pt denies any SOB or chest tightness. NAD noted.

## 2011-04-27 NOTE — Discharge Instructions (Signed)
If you develop similar symptoms that led to you coming to the hospital or if you develop shortness of breath, hives/rash, itching or throat swelling/closing return to the emergency department. You are allergic to a calls of drugs called NSAIDs. You should not take any medications in this class such as advil, aleve, motrin or ibuprofen

## 2011-04-27 NOTE — ED Notes (Signed)
Resp tech at bedside

## 2011-04-27 NOTE — ED Provider Notes (Signed)
History     CSN: [id number]  Arrival date & time 04/27/11  1759   First MD Initiated Contact with Patient 04/27/11 1824      Chief Complaint  Patient presents with  . Allergic Reaction    (Consider location/radiation/quality/duration/timing/severity/associated sxs/prior treatment) Patient is a 61 y.o. male presenting with allergic reaction. The history is provided by the patient and the spouse.  Allergic Reaction The primary symptoms are  wheezing, shortness of breath and angioedema. The primary symptoms do not include abdominal pain, nausea, vomiting, altered mental status, rash or urticaria. The current episode started 1 to 2 hours ago. The problem has been rapidly improving. This is a recurrent problem.  The patient's medical history is significant for asthma.  The shortness of breath developed suddenly. The patient's medical history is significant for asthma.  It is located on the face and lips. The angioedema is associated with shortness of breath. The angioedema is not associated with stridor.     Past Medical History  Diagnosis Date  . Bilateral pneumonia 07/31/2007  . Asthma   . Chronic cough   . Pansinusitis     Past Surgical History  Procedure Date  . Left hand surgery 08/2006    No family history on file.  History  Substance Use Topics  . Smoking status: Former Smoker -- 0.5 packs/day for 4 years    Quit date: 02/11/1997  . Smokeless tobacco: Not on file  . Alcohol Use: No      Review of Systems  Constitutional: Negative for fever and chills.  HENT: Positive for facial swelling (now resolved). Negative for congestion.   Respiratory: Positive for shortness of breath and wheezing. Negative for stridor.   Cardiovascular: Negative for chest pain.  Gastrointestinal: Negative for nausea, vomiting, abdominal pain, constipation and blood in stool.  Genitourinary: Negative for decreased urine volume and difficulty urinating.  Skin: Negative for rash.    Neurological: Positive for headaches.  Psychiatric/Behavioral: Negative for confusion and altered mental status.  All other systems reviewed and are negative.    Allergies  Review of patient's allergies indicates no known allergies.  Home Medications   Current Outpatient Rx  Name Route Sig Dispense Refill  . ALBUTEROL SULFATE HFA 108 (90 BASE) MCG/ACT IN AERS Inhalation Inhale 2 puffs into the lungs every 4 (four) hours as needed. 1 Inhaler 1  . BUDESONIDE-FORMOTEROL FUMARATE 160-4.5 MCG/ACT IN AERO  Take 2 puffs first thing in am and then another 2 puffs about 12 hours later.    1 Inhaler 11  . CETIRIZINE HCL 10 MG PO TABS Oral Take 10 mg by mouth at bedtime as needed.      Marland Kitchen DM-GUAIFENESIN ER 30-600 MG PO TB12 Oral Take 1 tablet by mouth every 12 (twelve) hours.      Marland Kitchen FAMOTIDINE 20 MG PO TABS Oral Take 20 mg by mouth at bedtime.     . OMEPRAZOLE MAGNESIUM 20 MG PO TBEC Oral Take 20 mg by mouth daily.      . TRAMADOL HCL 50 MG PO TABS  1 tablet by mouth every 4 hours as needed for excessive coughing 40 tablet 0    BP 162/103  Pulse 111  Temp(Src) 98.3 F (36.8 C) (Oral)  SpO2 94%  Physical Exam  Nursing note and vitals reviewed. Constitutional: He is oriented to person, place, and time. He appears well-developed and well-nourished.  HENT:  Head: Normocephalic and atraumatic.  Right Ear: External ear normal.  Left Ear: External ear normal.  Nose: Nose normal.       No evidence of facial swelling or lip edema  Neck: Neck supple.  Cardiovascular: Normal rate, regular rhythm, normal heart sounds and intact distal pulses.   Pulmonary/Chest: Effort normal. No accessory muscle usage. Not tachypneic. No respiratory distress. He has wheezes in the right upper field, the right middle field and the right lower field.  Abdominal: Soft. He exhibits no distension and no mass. There is no tenderness. There is no rebound and no guarding.  Musculoskeletal: He exhibits no edema.   Lymphadenopathy:    He has no cervical adenopathy.  Neurological: He is alert and oriented to person, place, and time.  Skin: Skin is warm and dry.    ED Course  Procedures (including critical care time)  Labs Reviewed - No data to display No results found.   1. Allergic reaction       MDM  61 yo male took aleve 3.5 hours ago and about 2 hours ago started having trouble breathing and facial swelling. Tripoding when EMS arrived. Given benadryl, zantac, solumedrol and albuterol by EMS. Here has right sided-wheezing and feeling of a lump in his throat. No facial swelling appreciated by me or by wife. Mildly hypoxia. Given another albuterol in ED with resolution of wheezing. Not hypoxic after treatment. No epinephrine required. No stridor or distress noted in ED. Patient felt back to his normal self after 1 treatment in ED. Headache appears benign, neuro exam normal. Able to walk around ED without dyspnea or hypoxia after a couple hours of obs in ED after treatment. Discussed strict return precautions, as well as staying away from all NSAIDs in the future.         Pricilla Loveless, MD 04/27/11 2351

## 2011-04-27 NOTE — ED Notes (Signed)
Pt states "I feel a lot better." Pt in NAD and WOB is decreased.

## 2011-04-27 NOTE — ED Notes (Signed)
Resp paged for pt

## 2011-04-28 NOTE — ED Provider Notes (Signed)
I saw and evaluated the patient, reviewed the resident's note and I agree with the findings and plan.   Loren Racer, MD 04/28/11 [phone]

## 2012-04-14 ENCOUNTER — Other Ambulatory Visit: Payer: Self-pay | Admitting: Internal Medicine

## 2012-04-15 ENCOUNTER — Telehealth: Payer: Self-pay | Admitting: Internal Medicine

## 2012-04-15 NOTE — Telephone Encounter (Signed)
I spoke with the pt son and advised that the pt will need to schedule an appt to get refills. Last seen 01-2011. He states he will speak with the pt and call us back to set an appt. Once appt is set please let triage know so they can send refill. Carron Curie, CMA

## 2012-04-20 NOTE — Telephone Encounter (Signed)
lmtcb x1 

## 2012-04-21 NOTE — Telephone Encounter (Signed)
lmtcb x2 

## 2012-04-22 NOTE — Telephone Encounter (Signed)
LM on son voicemail advising him to call if they want to set an appt. Carron Curie, CMA

## 2012-07-20 ENCOUNTER — Ambulatory Visit: Payer: No Typology Code available for payment source | Attending: Family Medicine | Admitting: Internal Medicine

## 2012-07-20 VITALS — BP 126/63 | HR 85 | Temp 98.1°F | Resp 15 | Wt 177.6 lb

## 2012-07-20 DIAGNOSIS — J441 Chronic obstructive pulmonary disease with (acute) exacerbation: Secondary | ICD-10-CM

## 2012-07-20 MED ORDER — ALBUTEROL SULFATE HFA 108 (90 BASE) MCG/ACT IN AERS: 2.0000 | INHALATION_SPRAY | Freq: Four times a day (QID) | RESPIRATORY_TRACT | Status: DC | PRN

## 2012-07-20 MED ORDER — AZITHROMYCIN 250 MG PO TABS: ORAL_TABLET | ORAL | Status: DC

## 2012-07-20 MED ORDER — PREDNISONE 10 MG PO TABS: ORAL_TABLET | ORAL | Status: DC

## 2012-07-20 NOTE — Patient Instructions (Signed)

## 2012-07-20 NOTE — Progress Notes (Signed)
Patient here to establish care History of COPD Needs referral to pulmonologist

## 2012-07-20 NOTE — Progress Notes (Signed)
Patient ID: Nathaniel Hartman, male   DOB: Nov 04, 1950, 63 y.o.   MRN: [id number]   CC: shortness of breath   HPI: Pt is 62 yo male with history of GERD and COPD, used to follow with pulmonologist Dr. Sherene Sires but has not followed recently as he has not had medical insurance. He presents to clinic today with main concern of progressively worsening shortness of breath, that started initially several days prior to this visit. Associated with wheezing and productive cough of yellow sputum. Pt denies fevers, chills, any specific abdominal or urinary concerns, no specific neurological symptoms.    Allergies  Allergen Reactions  . Acetaminophen    Past Medical History  Diagnosis Date  . Bilateral pneumonia 07/31/2007  . Asthma   . Chronic cough   . Pansinusitis   . COPD (chronic obstructive pulmonary disease)    Current Outpatient Prescriptions on File Prior to Visit  Medication Sig Dispense Refill  . budesonide-formoterol (SYMBICORT) 160-4.5 MCG/ACT inhaler Take 2 puffs first thing in am and then another 2 puffs about 12 hours later.     1 Inhaler  11  . [DISCONTINUED] cetirizine (ZYRTEC) 10 MG tablet Take 10 mg by mouth at bedtime as needed.        . [DISCONTINUED] omeprazole (PRILOSEC OTC) 20 MG tablet Take 20 mg by mouth daily.         No current facility-administered medications on file prior to visit.   History reviewed. No pertinent family history. History   Social History  . Marital Status: Married    Spouse Name: N/A    Number of Children: N/A  . Years of Education: N/A   Occupational History  . metal work    Social History Main Topics  . Smoking status: Former Smoker -- 0.50 packs/day for 4 years    Quit date: 02/11/1997  . Smokeless tobacco: Not on file  . Alcohol Use: No  . Drug Use: No  . Sexually Active: Not on file   Other Topics Concern  . Not on file   Social History Narrative  . No narrative on file    Review of Systems  Constitutional: Negative for fever,  chills, diaphoresis, activity change, appetite change and fatigue.  HENT: Negative for ear pain, nosebleeds, congestion, facial swelling, rhinorrhea, neck pain, neck stiffness and ear discharge.   Eyes: Negative for pain, discharge, redness, itching and visual disturbance.  Respiratory: Per HPI Cardiovascular: Negative for chest pain, palpitations and leg swelling.  Gastrointestinal: Negative for abdominal distention.  Genitourinary: Negative for dysuria, urgency, frequency, hematuria, flank pain, decreased urine volume, difficulty urinating and dyspareunia.  Musculoskeletal: Negative for back pain, joint swelling, arthralgias and gait problem.  Neurological: Negative for dizziness, tremors, seizures, syncope, facial asymmetry, speech difficulty, weakness, light-headedness, numbness and headaches.  Hematological: Negative for adenopathy. Does not bruise/bleed easily.  Psychiatric/Behavioral: Negative for hallucinations, behavioral problems, confusion, dysphoric mood, decreased concentration and agitation.    Objective:   Filed Vitals:   07/20/12 0936  BP: 126/63  Pulse: 85  Temp: 98.1 F (36.7 C)  Resp: 15    Physical Exam  Constitutional: Appears well-developed and well-nourished. No distress.  HENT: Normocephalic. External right and left ear normal. Oropharynx is clear and moist.  Eyes: Conjunctivae and EOM are normal. PERRLA, no scleral icterus.  Neck: Normal ROM. Neck supple. No JVD. No tracheal deviation. No thyromegaly.  CVS: RRR, S1/S2 +, no murmurs, no gallops, no carotid bruit.  Pulmonary: Effort and breath sounds normal, expiratory wheezing present  with right lower lobe rhonchi  Abdominal: Soft. BS +,  no distension, tenderness, rebound or guarding.  Musculoskeletal: Normal range of motion. No edema and no tenderness.  Lymphadenopathy: No lymphadenopathy noted, cervical, inguinal. Neuro: Alert. Normal reflexes, muscle tone coordination. No cranial nerve deficit. Skin:  Skin is warm and dry. No rash noted. Not diaphoretic. No erythema. No pallor.  Psychiatric: Normal mood and affect. Behavior, judgment, thought content normal.   Lab Results  Component Value Date   WBC 10.7* 03/11/2010   HGB 13.8 03/11/2010   HCT 41.1 03/11/2010   MCV 80.4 03/11/2010   PLT 189 03/11/2010   Lab Results  Component Value Date   CREATININE 1.19 03/12/2010   BUN 25* 03/12/2010   NA 139 03/12/2010   K 3.9 03/12/2010   CL 102 03/12/2010   CO2 25 03/12/2010    Lab Results  Component Value Date   HGBA1C  Value: (NOTE) Test Procedure               Results       Units       Normal Range Hemoglobin A1c w Est Avg/Me Hemoglobin A1C               TNP           %           4.6-6.1 (performed at MAIN) Unable to determine HgbA1C using standard method due to interference  of probable abnormal hemoglobin. Suggest Glycohemoglobin, Total (40981). 12/06/2007   Lipid Panel     Component Value Date/Time   CHOL  Value: 149        ATP III CLASSIFICATION:  <200     mg/dL   Desirable  191-478  mg/dL   Borderline High  >=295    mg/dL   High 08/01/3084 5784   TRIG 33 08/10/2007 0400   HDL 35* 08/10/2007 0400   CHOLHDL 4.3 08/10/2007 0400   VLDL 7 08/10/2007 0400   LDLCALC  Value: 107        Total Cholesterol/HDL:CHD Risk Coronary Heart Disease Risk Table                     Men   Women  1/2 Average Risk   3.4   3.3* 08/10/2007 0400       Assessment and plan:   Patient Active Problem List   Diagnosis Date Noted  . COPD - based on clinical symptoms of wheezing and rhonchi in right lower lobe will treat this as COPD exacerbation, I will provide albuterol inhaler to use as needed, antibiotic, prednisone taper pack over 2 week period. I have also advised patient to come back in few days to make sure that his symptoms are improving and that  wheezing is resolved. If patient is not getting better we have discussed obtaining chest x-ray. We have discussed chest x-ray today but patient is unable to do that today.   10/24/2010  . G E R D - continue PPI as already prescribed, aggressive acid reflux control  11/05/2007

## 2012-07-24 ENCOUNTER — Ambulatory Visit: Payer: No Typology Code available for payment source | Attending: Family Medicine | Admitting: Family Medicine

## 2012-07-24 VITALS — BP 132/92 | HR 72 | Temp 97.7°F | Resp 16

## 2012-07-24 DIAGNOSIS — J310 Chronic rhinitis: Secondary | ICD-10-CM

## 2012-07-24 DIAGNOSIS — J4489 Other specified chronic obstructive pulmonary disease: Secondary | ICD-10-CM | POA: Insufficient documentation

## 2012-07-24 DIAGNOSIS — K219 Gastro-esophageal reflux disease without esophagitis: Secondary | ICD-10-CM

## 2012-07-24 DIAGNOSIS — J449 Chronic obstructive pulmonary disease, unspecified: Secondary | ICD-10-CM | POA: Insufficient documentation

## 2012-07-24 DIAGNOSIS — J441 Chronic obstructive pulmonary disease with (acute) exacerbation: Secondary | ICD-10-CM

## 2012-07-24 DIAGNOSIS — J45909 Unspecified asthma, uncomplicated: Secondary | ICD-10-CM

## 2012-07-24 NOTE — Progress Notes (Signed)
Patient ID: Nathaniel Hartman, male   DOB: [birth year], 62 y.o.   MRN: [id number]  CC: follow up COPD  HPI: Pt says that he feels much better.  He is taking medications and prednisone taper and antibiotics  Allergies  Allergen Reactions  . Acetaminophen    Past Medical History  Diagnosis Date  . Bilateral pneumonia 07/31/2007  . Asthma   . Chronic cough   . Pansinusitis   . COPD (chronic obstructive pulmonary disease)    Current Outpatient Prescriptions on File Prior to Visit  Medication Sig Dispense Refill  . albuterol (PROVENTIL HFA;VENTOLIN HFA) 108 (90 BASE) MCG/ACT inhaler Inhale 2 puffs into the lungs every 6 (six) hours as needed for wheezing.  1 Inhaler  11  . azithromycin (ZITHROMAX) 250 MG tablet Take 500 mg tablet today and continue taking 250 mg tablet once daily for 6 more days  8 each  0  . budesonide-formoterol (SYMBICORT) 160-4.5 MCG/ACT inhaler Take 2 puffs first thing in am and then another 2 puffs about 12 hours later.     1 Inhaler  11  . Famotidine (PEPCID PO) Take by mouth.      . Omeprazole (PRILOSEC PO) Take by mouth.      . predniSONE (DELTASONE) 10 MG tablet Take 50 mg tablet today and one tablet tomorrow, take 40 mg tablet on 6/11 and one 40 mg tablet on June 12, take 30 mg tablet on June 13, and one 30 mg tablet on June 14, Take 20 mg tablet on June 15, and one 20 mg tablet on June 16, take 10 mg tablet on June 17, and one 10 mg tablet on June 18. Then stop  30 tablet  0  . [DISCONTINUED] cetirizine (ZYRTEC) 10 MG tablet Take 10 mg by mouth at bedtime as needed.        . [DISCONTINUED] omeprazole (PRILOSEC OTC) 20 MG tablet Take 20 mg by mouth daily.         No current facility-administered medications on file prior to visit.   History reviewed. No pertinent family history. History   Social History  . Marital Status: Married    Spouse Name: N/A    Number of Children: N/A  . Years of Education: N/A   Occupational History  . metal work    Social  History Main Topics  . Smoking status: Former Smoker -- 0.50 packs/day for 4 years    Quit date: 02/11/1997  . Smokeless tobacco: Not on file  . Alcohol Use: No  . Drug Use: No  . Sexually Active: Not on file   Other Topics Concern  . Not on file   Social History Narrative  . No narrative on file    Review of Systems  Constitutional: Negative for fever, chills, diaphoresis, activity change, appetite change and fatigue.  HENT: Negative for ear pain, nosebleeds, congestion, facial swelling, rhinorrhea, neck pain, neck stiffness and ear discharge.   Eyes: Negative for pain, discharge, redness, itching and visual disturbance.  Respiratory: Negative for cough, choking, chest tightness, shortness of breath, wheezing and stridor.   Cardiovascular: Negative for chest pain, palpitations and leg swelling.  Gastrointestinal: Negative for abdominal distention.  Genitourinary: Negative for dysuria, urgency, frequency, hematuria, flank pain, decreased urine volume, difficulty urinating and dyspareunia.  Musculoskeletal: Negative for back pain, joint swelling, arthralgias and gait problem.  Neurological: Negative for dizziness, tremors, seizures, syncope, facial asymmetry, speech difficulty, weakness, light-headedness, numbness and headaches.  Hematological: Negative for adenopathy. Does not bruise/bleed easily.  Psychiatric/Behavioral: Negative for hallucinations, behavioral problems, confusion, dysphoric mood, decreased concentration and agitation.    Objective:   Filed Vitals:   07/24/12 1258  BP: 132/92  Pulse: 72  Temp: 97.7 F (36.5 C)  Resp: 16    Physical Exam  Constitutional: Appears well-developed and well-nourished. No distress.  HENT: Normocephalic. External right and left ear normal. Oropharynx is clear and moist.  Eyes: Conjunctivae and EOM are normal. PERRLA, no scleral icterus.  Neck: Normal ROM. Neck supple. No JVD. No tracheal deviation. No thyromegaly.  CVS: RRR,  S1/S2 +, no murmurs, no gallops, no carotid bruit.  Pulmonary: Effort and breath sounds normal, no stridor, rhonchi, wheezes, rales.  Abdominal: Soft. BS +,  no distension, tenderness, rebound or guarding.  Musculoskeletal: Normal range of motion. No edema and no tenderness.  Lymphadenopathy: No lymphadenopathy noted, cervical, inguinal. Neuro: Alert. Normal reflexes, muscle tone coordination. No cranial nerve deficit. Skin: Skin is warm and dry. No rash noted. Not diaphoretic. No erythema. No pallor.  Psychiatric: Normal mood and affect. Behavior, judgment, thought content normal.   Lab Results  Component Value Date   WBC 10.7* 03/11/2010   HGB 13.8 03/11/2010   HCT 41.1 03/11/2010   MCV 80.4 03/11/2010   PLT 189 03/11/2010   Lab Results  Component Value Date   CREATININE 1.19 03/12/2010   BUN 25* 03/12/2010   NA 139 03/12/2010   K 3.9 03/12/2010   CL 102 03/12/2010   CO2 25 03/12/2010    Lab Results  Component Value Date   HGBA1C  Value: (NOTE) Test Procedure               Results       Units       Normal Range Hemoglobin A1c w Est Avg/Me Hemoglobin A1C               TNP           %           4.6-6.1 (performed at MAIN) Unable to determine HgbA1C using standard method due to interference  of probable abnormal hemoglobin. Suggest Glycohemoglobin, Total (16109). 12/06/2007   Lipid Panel     Component Value Date/Time   CHOL  Value: 149        ATP III CLASSIFICATION:  <200     mg/dL   Desirable  604-540  mg/dL   Borderline High  >=981    mg/dL   High 1/91/4782 9562   TRIG 33 08/10/2007 0400   HDL 35* 08/10/2007 0400   CHOLHDL 4.3 08/10/2007 0400   VLDL 7 08/10/2007 0400   LDLCALC  Value: 107        Total Cholesterol/HDL:CHD Risk Coronary Heart Disease Risk Table                     Men   Women  1/2 Average Risk   3.4   3.3* 08/10/2007 0400       Assessment and plan:   Patient Active Problem List   Diagnosis Date Noted  . Chronic rhinitis 10/24/2010  . Intrinsic asthma, unspecified  11/30/2007  . G E R D 11/05/2007    COPD exacerbation Much improved Complete prednisone taper and antibiotics Continue inhalers as prescribed  RTC in 3 months  The patient was given clear instructions to go to ER or return to medical center if symptoms don't improve, worsen or new problems develop.  The patient verbalized understanding.  The patient was told  to call to get lab results if they haven't heard anything in the next week.    Rodney Langton, MD, CDE, FAAFP [facility] [place], [place]

## 2012-07-24 NOTE — Progress Notes (Signed)
Follow up COPD

## 2012-07-24 NOTE — Patient Instructions (Signed)
Chronic Obstructive Pulmonary Disease  Chronic obstructive pulmonary disease (COPD) is a lung disease. The lungs become damaged. This makes it hard to get air in and out of your lungs. The damage to your lungs cannot be changed. There are things you can do to improve the lungs and make you feel better.  HOME CARE   Take all medicines as told by your doctor.   Use medicines that you breathe in (inhale) as told by your doctor.   Avoid medicines or cough syrups that dry up your airway (antihistamines) and do not allow you to get rid of thick spit.   If you smoke, stop.   Avoid being around smoke, chemicals, and fumes that bother your breathing.   Avoid people that have a catchy (contagious) sickness.   Avoid going outside when it is very hot, cold, or humid.   Use humidifiers in your home and near your bedside if it helps your breathing.   Drink enough fluids to keep your pee (urine) clear or pale yellow.   Eat healthy foods. Eat smaller meals more often and rest before meals.   Ask your doctor if it is okay to take vitamins or pills with minerals (supplements).   Exercise and stay active.   Rest with activity.   Get into a comfortable position when you have trouble breathing.   Learn and use tips on how to relax.   Learn and use tips on how to control your breathing as told by your doctor. Try:   Breathing in through your nose for 1 second. Then, breath out (exhale) through your puckered (like a whistle) lips for 2 seconds.   Putting one hand on your belly (abdomen). Breathe in slowly through your nose. Your hand on your belly should move out. Breathe out slowly through your puckered lips. Your hand on your belly should move in as you breathe out.   Learn and use controlled coughing to clear thick spit from your lungs.  1. Lean your head slightly forward.  2. Breathe in deeply.  3. Try to hold your breath for 3 seconds.  4. Keep your mouth slightly open while coughing 2 times.  5. Spit any thick  spit out into a tissue.  6. Rest and do the steps again 1 or 2 times as needed.   Get all shots (vaccines) a told by your doctor.   Learn how to manage stress.   Schedule and go to all follow-up doctor visits.   Go to therapy that can help you improve your lungs (pulmonary rehabilitation) as told by your doctor.   Use oxygen at home as told by your doctor.  GET HELP RIGHT AWAY IF:    You can feel your heart beating really fast.   You have shortness of breath while resting.   You have shortness of breath that stops you from being able to talk.   You have shortness of breath that stops you from doing normal activities.   You have chest pain lasting longer than 5 minutes.   You start to shake uncontrollably (seizure).   Your family or friends notice that you are flustered or confused.   You cough up more thick spit than usual.   There is a change in the color or thickness of the spit.   Breathing is more difficult than usual.   Your breathing is faster than usual.   Your skin color is more blueish than usual.   You are running out of the   medicine you take for breathing.   You are anxious, uneasy, fearful, or restless.   You have a fever.  MAKE SURE YOU:    Understand these instructions.   Will watch your condition.   Will get help right away if you are not doing well or get worse.  Document Released: 07/17/2007 Document Revised: 01/15/2012 Document Reviewed: 03/30/2010  [employer] Patient Information 2014 [employer].

## 2012-08-21 ENCOUNTER — Ambulatory Visit (HOSPITAL_COMMUNITY)
Admission: RE | Admit: 2012-08-21 | Discharge: 2012-08-21 | Disposition: A | Discharge status: Home / Self Care | Payer: No Typology Code available for payment source | Source: Ambulatory Visit | Attending: Internal Medicine | Admitting: Internal Medicine

## 2012-08-21 ENCOUNTER — Ambulatory Visit: Payer: No Typology Code available for payment source | Attending: Family Medicine | Admitting: Internal Medicine

## 2012-08-21 ENCOUNTER — Telehealth: Payer: Self-pay

## 2012-08-21 VITALS — BP 119/82 | HR 83 | Temp 98.5°F | Resp 20 | Ht 73.0 in | Wt 168.0 lb

## 2012-08-21 DIAGNOSIS — J069 Acute upper respiratory infection, unspecified: Secondary | ICD-10-CM | POA: Insufficient documentation

## 2012-08-21 DIAGNOSIS — J441 Chronic obstructive pulmonary disease with (acute) exacerbation: Secondary | ICD-10-CM

## 2012-08-21 DIAGNOSIS — R059 Cough, unspecified: Secondary | ICD-10-CM | POA: Insufficient documentation

## 2012-08-21 DIAGNOSIS — R05 Cough: Secondary | ICD-10-CM | POA: Insufficient documentation

## 2012-08-21 LAB — LIPID PANEL
HDL: 35 mg/dL — ABNORMAL LOW (ref >39)
LDL Cholesterol: 118 mg/dL — ABNORMAL HIGH (ref 0–99)
Total CHOL/HDL Ratio: 5.1 Ratio

## 2012-08-21 LAB — HEMOGLOBIN A1C
Hgb A1c MFr Bld: 5.5 % (ref <5.7)
Mean Plasma Glucose: 111 mg/dL (ref <117)

## 2012-08-21 LAB — COMPLETE METABOLIC PANEL WITH GFR
Albumin: 4.1 g/dL (ref 3.5–5.2)
Alkaline Phosphatase: 65 U/L (ref 39–117)
BUN: 16 mg/dL (ref 6–23)
Calcium: 10 mg/dL (ref 8.4–10.5)
Chloride: 105 mEq/L (ref 96–112)
GFR, Est Non African American: 43 mL/min — ABNORMAL LOW
Glucose, Bld: 94 mg/dL (ref 70–99)
Potassium: 5.1 mEq/L (ref 3.5–5.3)
Sodium: 140 mEq/L (ref 135–145)
Total Protein: 7.2 g/dL (ref 6.0–8.3)

## 2012-08-21 LAB — CBC
HCT: 40.3 % (ref 39.0–52.0)
Hemoglobin: 13.6 g/dL (ref 13.0–17.0)
MCH: 26.4 pg (ref 26.0–34.0)
MCV: 78.1 fL (ref 78.0–100.0)
RBC: 5.16 MIL/uL (ref 4.22–5.81)
WBC: 8.3 10*3/uL (ref 4.0–10.5)

## 2012-08-21 LAB — TSH: TSH: 1.801 u[IU]/mL (ref 0.350–4.500)

## 2012-08-21 MED ORDER — PREDNISONE 5 MG PO TABS: ORAL_TABLET | ORAL | Status: DC

## 2012-08-21 MED ORDER — LEVOFLOXACIN 500 MG PO TABS: 750.0000 mg | ORAL_TABLET | Freq: Every day | ORAL | Status: DC

## 2012-08-21 NOTE — Progress Notes (Signed)
Patient presents for follow up of chest congestion and cough; states that congestion is worse at night and when he is in a lying position. Was told at last visit that he may need a chest xray and referral to pulmonary.

## 2012-08-21 NOTE — Progress Notes (Signed)
Quick Note:  Please call in Levaquin 750 mg once a day seven-day supply, and informed the patient about it as soon as possible. He might have early pneumonia. ______

## 2012-08-21 NOTE — Addendum Note (Signed)
Addended by: Xitlalic Maslin K on: 08/21/2012 04:38 PM   Modules accepted: Orders  

## 2012-08-21 NOTE — Telephone Encounter (Signed)
Left message to return our call.

## 2012-08-21 NOTE — Progress Notes (Addendum)
Patient ID: CUINN WESTERHOLD, male   DOB: Dec 04, 1950, 62 y.o.   MRN: [id number] Patient Demographics  Filipe Greathouse, is a 62 y.o. male  WUJ:[id number]  [id number]  DOB - Jul 01, 1950  Chief Complaint  Patient presents with  . Follow-up  . URI        Subjective:   [name] with History of COPD, GERD, asthma who recently finished a prednisone taper and Z-Pak course for a COPD exacerbation  is here for followup, his symptoms are better than before but he is wheezing again a little bit, no shortness of breath at rest, no fever chills, he does have a mild cough.  Denies any subjective complaints except as above, no active headache, no chest abdominal pain at this time, not short of breath. No focal weakness which is new.   Objective:    Patient Active Problem List   Diagnosis Date Noted  . COPD exacerbation 07/24/2012  . Chronic rhinitis 10/24/2010  . Intrinsic asthma, unspecified 11/30/2007  . G E R D 11/05/2007     Filed Vitals:   08/21/12 1143  BP: 119/82  Pulse: 83  Temp: 98.5 F (36.9 C)  TempSrc: Oral  Resp: 20  Height: 6\' 1"  (1.854 m)  Weight: 168 lb (76.204 kg)  SpO2: 93%     Exam   Awake Alert, Oriented X 3, No new F.N deficits, Normal affect [place].AT,PERRAL Supple Neck,No JVD, No cervical lymphadenopathy appriciated.  Symmetrical Chest wall movement, Mod air movement bilaterally, mild bilateral wheezing RRR,No Gallops,Rubs or new Murmurs, No Parasternal Heave +ve B.Sounds, Abd Soft, Non tender, No organomegaly appriciated, No rebound - guarding or rigidity. No Cyanosis, Clubbing or edema, No new Rash or bruise       Data Review   CBC No results found for this basename: WBC, HGB, HCT, PLT, MCV, MCH, MCHC, RDW, NEUTRABS, LYMPHSABS, MONOABS, EOSABS, BASOSABS, BANDABS, BANDSABD,  in the last 168 hours  Chemistries   No results found for this basename: NA, K, CL, CO2, GLUCOSE, BUN, CREATININE, GFRCGP, CALCIUM, MG, AST, ALT, ALKPHOS, BILITOT,  in the  last 168 hours ------------------------------------------------------------------------------------------------------------------ No results found for this basename: HGBA1C,  in the last 72 hours ------------------------------------------------------------------------------------------------------------------ No results found for this basename: CHOL, HDL, LDLCALC, TRIG, CHOLHDL, LDLDIRECT,  in the last 72 hours ------------------------------------------------------------------------------------------------------------------ No results found for this basename: TSH, T4TOTAL, FREET3, T3FREE, THYROIDAB,  in the last 72 hours ------------------------------------------------------------------------------------------------------------------ No results found for this basename: VITAMINB12, FOLATE, FERRITIN, TIBC, IRON, RETICCTPCT,  in the last 72 hours  Coagulation profile  No results found for this basename: INR, PROTIME,  in the last 168 hours     Prior to Admission medications   Medication Sig Start Date End Date Taking? Authorizing Provider  albuterol (PROVENTIL HFA;VENTOLIN HFA) 108 (90 BASE) MCG/ACT inhaler Inhale 2 puffs into the lungs every 6 (six) hours as needed for wheezing. 07/20/12  Yes Dorothea Ogle, MD  budesonide-formoterol [facility]) 160-4.5 MCG/ACT inhaler Take 2 puffs first thing in am and then another 2 puffs about 12 hours later.    02/01/11  Yes Nyoka Cowden, MD  Famotidine (PEPCID PO) Take by mouth.   Yes Historical Provider, MD  Omeprazole (PRILOSEC PO) Take by mouth.   Yes Historical Provider, MD  predniSONE (DELTASONE) 5 MG tablet Label  & dispense according to the schedule below. 10 Pills PO for 3 days then, 8 Pills PO for 3 days, 6 Pills PO for 3 days, 4 Pills PO for 3 days, 2 Pills PO  for 3 days, 1 Pills PO for 3 days, 1/2 Pill  PO for 3 days then STOP. Total 95 pills. 08/21/12   Leroy Sea, MD     Assessment & Plan   COPD exacerbation. He had improved  somewhat after recent Z-Pak and prednisone, he finished the course about a week ago and now wheezing has increased along with some cough and shortness of breath, he appears to have a mild COPD exacerbation again with some wheezing on exam however he appears to be in no distress. Will give him a longer prednisone taper and obtain a two-view chest x-ray, continue home rescue inhaler along with Symbicort he does not want to try Spiriva , we'll refer him to pulmonary outpatient he is seeing Dr. Sherene Sires before.   Addendum 2 view chest x-ray back. Patient likely has right lower lobe infiltrate, have ordered 7 days of oral Levaquin. Repeat 2 view chest x-ray in 10-12 days.  Message left on patient's answering machine he also has an outpatient pulmonary followup referral.     Routine health maintenance.  Screening labs. CBC, CMP, TSH, A1c, lipid panel ordered  Colonoscopy - referral made    Immunizations tetanus shot next visit we are out of tetanus shot         Leroy Sea M.D on 08/21/2012 at 12:07 PM

## 2012-08-24 ENCOUNTER — Telehealth: Payer: Self-pay

## 2012-08-24 NOTE — Telephone Encounter (Signed)
Left message on machine to please return our call asap

## 2012-08-24 NOTE — Telephone Encounter (Signed)
Message copied by Lestine Mount on Mon Aug 24, 2012  9:05 AM ------      Message from: [facility] K      Created: Mon Aug 24, 2012  8:53 AM       Please have patient go to the ER on her blood work there is suggestion of renal failure, he might require IV fluids and additional testing. ------

## 2012-08-24 NOTE — Progress Notes (Signed)
Quick Note:  Please have patient go to the ER on her blood work there is suggestion of renal failure, he might require IV fluids and additional testing. ______

## 2012-09-04 ENCOUNTER — Ambulatory Visit: Payer: No Typology Code available for payment source | Attending: Family Medicine | Admitting: Internal Medicine

## 2012-09-04 VITALS — BP 122/85 | HR 72 | Temp 99.1°F | Resp 16 | Ht 74.0 in | Wt 168.4 lb

## 2012-09-04 DIAGNOSIS — J45909 Unspecified asthma, uncomplicated: Secondary | ICD-10-CM

## 2012-09-04 DIAGNOSIS — J310 Chronic rhinitis: Secondary | ICD-10-CM

## 2012-09-04 DIAGNOSIS — J189 Pneumonia, unspecified organism: Secondary | ICD-10-CM | POA: Insufficient documentation

## 2012-09-04 DIAGNOSIS — J441 Chronic obstructive pulmonary disease with (acute) exacerbation: Secondary | ICD-10-CM

## 2012-09-04 MED ORDER — BUDESONIDE-FORMOTEROL FUMARATE 160-4.5 MCG/ACT IN AERO: INHALATION_SPRAY | RESPIRATORY_TRACT | Status: DC

## 2012-09-04 NOTE — Progress Notes (Signed)
Patient ID: Nathaniel Hartman, male   DOB: 03-11-1950, 62 y.o.   MRN: [id number] Patient Demographics  Nathaniel Hartman, is a 62 y.o. male  WJX:[id number]  [id number]:[id number]  DOB - [birth year]  Chief Complaint  Patient presents with  . Follow-up    CHEST XRAY/PNA        Subjective:   Nathaniel Hartman today is here for a follow up visit. Patient has No headache, No chest pain, No abdominal pain - No Nausea, No new weakness tingling or numbness, No Cough - SOB. Chest x-ray was done on 08/21/2012 which showed hyperinflation and right mid lobe pneumonia. Patient's symptoms have resolved.  Objective:    Filed Vitals:   09/04/12 1205  BP: 122/85  Pulse: 72  Temp: 99.1 F (37.3 C)  TempSrc: Oral  Resp: 16  Height: 6\' 2"  (1.88 m)  Weight: 168 lb 6.4 oz (76.386 kg)  SpO2: 98%     ALLERGIES:   Allergies  Allergen Reactions  . Acetaminophen     PAST MEDICAL HISTORY: Past Medical History  Diagnosis Date  . Bilateral pneumonia 07/31/2007  . Asthma   . Chronic cough   . Pansinusitis   . COPD (chronic obstructive pulmonary disease)     MEDICATIONS AT HOME: Prior to Admission medications   Medication Sig Start Date End Date Taking? Authorizing Provider  albuterol (PROVENTIL HFA;VENTOLIN HFA) 108 (90 BASE) MCG/ACT inhaler Inhale 2 puffs into the lungs every 6 (six) hours as needed for wheezing. 07/20/12  Yes Dorothea Ogle, MD  budesonide-formoterol [facility]) 160-4.5 MCG/ACT inhaler Take 2 puffs first thing in am and then another 2 puffs about 12 hours later. 09/04/12  Yes Wilferd Ritson Jenna Luo, MD  levofloxacin (LEVAQUIN) 500 MG tablet Take 1.5 tablets (750 mg total) by mouth daily. 08/21/12  Yes Leroy Sea, MD  Omeprazole (PRILOSEC PO) Take by mouth.   Yes Historical Provider, MD  predniSONE (DELTASONE) 5 MG tablet Label  & dispense according to the schedule below. 10 Pills PO for 3 days then, 8 Pills PO for 3 days, 6 Pills PO for 3 days, 4 Pills PO for 3 days, 2 Pills PO for 3 days, 1  Pills PO for 3 days, 1/2 Pill  PO for 3 days then STOP. Total 95 pills. 08/21/12  Yes Leroy Sea, MD  Famotidine (PEPCID PO) Take by mouth.    Historical Provider, MD     Exam  General appearance :Awake, alert, NAD, Speech Clear.  HEENT: Atraumatic and Normocephalic, PERLA Neck: supple, no JVD. No cervical lymphadenopathy.  Chest: Clear to auscultation bilaterally, no wheezing, rales or rhonchi CVS: S1 S2 regular, no murmurs.  Abdomen: soft, NBS, NT, ND, no gaurding, rigidity or rebound. Extremities: no cyanosis or clubbing, B/L Lower Ext shows no edema Neurology: Awake alert, and oriented X 3, CN II-XII intact, Non focal Skin: No Rash or lesions Wounds:N/A    Data Review   Basic Metabolic Panel: No results found for this basename: NA, K, CL, CO2, GLUCOSE, BUN, CREATININE, CALCIUM, MG, PHOS,  in the last 168 hours Liver Function Tests: No results found for this basename: AST, ALT, ALKPHOS, BILITOT, PROT, ALBUMIN,  in the last 168 hours  CBC: No results found for this basename: WBC, NEUTROABS, HGB, HCT, MCV, PLT,  in the last 168 hours  ------------------------------------------------------------------------------------------------------------------ No results found for this basename: HGBA1C,  in the last 72 hours ------------------------------------------------------------------------------------------------------------------ No results found for this basename: CHOL, HDL, LDLCALC, TRIG, CHOLHDL, LDLDIRECT,  in the last 72 hours ------------------------------------------------------------------------------------------------------------------  No results found for this basename: TSH, T4TOTAL, FREET3, T3FREE, THYROIDAB,  in the last 72 hours ------------------------------------------------------------------------------------------------------------------ No results found for this basename: VITAMINB12, FOLATE, FERRITIN, TIBC, IRON, RETICCTPCT,  in the last 72 hours  Coagulation  profile  No results found for this basename: INR, PROTIME,  in the last 168 hours    Assessment & Plan   Active Problems: Community acquired pneumonia - Patient chest x-ray was done on 08/21/2012, had shown hyperinflation with right middle lobe pneumonia. Patient was called and was started on Levaquin , Prednisone with taper, continued on albuterol, Symbicort inhalers.  His symptoms has currently resolved, patient denies any aspiration issues. - Will obtain chest x-ray around August 1 to assess for complete resolution of the pneumonia. - renewed Symbicort with refills   Recommendations: Chest x-ray around August 1st Follow-up in 3 weeks     Layn Kye M.D. 09/04/2012, 12:26 PM

## 2012-09-04 NOTE — Progress Notes (Signed)
PT HERE FOR F/U CHEST XRAY S/P PNA. PT PRESCRIBED ATB'S/PREDNISONE. TOLERATING WELL. DENIES CP OR FEVER,CHILLS. VSS  HAS 1 MORE WEEK PREDNISONE

## 2012-09-07 ENCOUNTER — Ambulatory Visit: Payer: No Typology Code available for payment source | Admitting: Internal Medicine

## 2012-09-09 ENCOUNTER — Ambulatory Visit: Payer: No Typology Code available for payment source | Attending: Family Medicine

## 2012-09-09 DIAGNOSIS — R899 Unspecified abnormal finding in specimens from other organs, systems and tissues: Secondary | ICD-10-CM

## 2012-09-10 ENCOUNTER — Telehealth: Payer: Self-pay | Admitting: *Deleted

## 2012-09-10 LAB — COMPLETE METABOLIC PANEL WITH GFR
AST: 17 U/L (ref 0–37)
Calcium: 8.5 mg/dL (ref 8.4–10.5)
Creat: 1.42 mg/dL — ABNORMAL HIGH (ref 0.50–1.35)
GFR, Est African American: 60 mL/min
GFR, Est Non African American: 52 mL/min — ABNORMAL LOW
Glucose, Bld: 88 mg/dL (ref 70–99)
Potassium: 4.1 mEq/L (ref 3.5–5.3)
Sodium: 142 mEq/L (ref 135–145)

## 2012-09-10 NOTE — Progress Notes (Signed)
Quick Note:  Please provide patient another followup appointment and a BMP check in 10 days time. ______

## 2012-09-10 NOTE — Telephone Encounter (Signed)
09/10/12 Patient not available message left via telephone to call and schedule appointment  Within 10 days for BMP and follow up appointment. P.Velda Wendt,RN BSN [facility]

## 2012-09-11 ENCOUNTER — Ambulatory Visit (HOSPITAL_COMMUNITY)
Admission: RE | Admit: 2012-09-11 | Discharge: 2012-09-11 | Disposition: A | Discharge status: Home / Self Care | Payer: No Typology Code available for payment source | Source: Ambulatory Visit | Attending: Internal Medicine | Admitting: Internal Medicine

## 2012-09-11 DIAGNOSIS — J45909 Unspecified asthma, uncomplicated: Secondary | ICD-10-CM

## 2012-09-11 DIAGNOSIS — J189 Pneumonia, unspecified organism: Secondary | ICD-10-CM | POA: Insufficient documentation

## 2012-09-21 ENCOUNTER — Encounter: Payer: Self-pay | Admitting: Internal Medicine

## 2012-09-21 ENCOUNTER — Ambulatory Visit (INDEPENDENT_AMBULATORY_CARE_PROVIDER_SITE_OTHER): Payer: No Typology Code available for payment source | Admitting: Internal Medicine

## 2012-09-21 VITALS — BP 120/80 | HR 78 | Temp 97.8°F | Ht 74.0 in | Wt 173.0 lb

## 2012-09-21 DIAGNOSIS — J449 Chronic obstructive pulmonary disease, unspecified: Secondary | ICD-10-CM

## 2012-09-21 NOTE — Patient Instructions (Addendum)
See calendar for specific medication instructions and bring it back for each and every office visit for every healthcare provider you see.  Without it,  you may not receive the best quality medical care that we feel you deserve.  You will note that the calendar groups together  your maintenance  medications that are timed at particular times of the day.  Think of this as your checklist for what your doctor has instructed you to do until your next evaluation to see what benefit  there is  to staying on a consistent group of medications intended to keep you well.  The other group at the bottom is entirely up to you to use as you see fit  for specific symptoms that may arise between visits that require you to treat them on an as needed basis.  Think of this as your action plan or "what if" list.   Separating the top medications from the bottom group is fundamental to providing you adequate care going forward.     If not doing great, next step is to see Tammy NP with all your medications to regroup.

## 2012-09-21 NOTE — Progress Notes (Signed)
Subjective:     Patient ID: Nathaniel Hartman, male   DOB: 1950/06/04, 62 y.o.   MRN: [id number]  HPI  62 yo vietnamese male quit smoking 1999 with AB adm 10/22/2007 acute resp failure extubated on 9/11 and discharged 9/14 on symbicort and prednisone taper. Spirometry and walking sats normal after this episode c/w asthma, not copd.  January 10, 2010 ov cough much better not using proair, excellent technique with symbicort, on 5 mg per day. rec Try Prednisone 10 mg one half odd days only, take none on even days.  Remember to keep proaire handy for as needed use   1/26-30/2012 admit for ? aecopd in setting of severe N and V, back to baseline p discharge    March 20, 2010 ov f/u asthma on pred 10 mg one half on even, no change on days he takes vs not taking. rec q 3d pred   April 25, 2010 ov no cough no sob, no need prns. no dicolored sputum.  rec ) Ok to try prednisone every 3rd day and stop at end of month if doing well   09/13/2010 f/u ov/Wert cc increase cough mostly day, not bothering him once asleep. Minimal use of saba. No purulent sputum. recPrednisone 10 mg take  4 each am x 2 days,   2 each am x 2 days,  1 each am x2days and stop  GERD   Diet   10/24/2010 f/u ov/Wert cc worse cough  Indolent onset, slt progressive since 2 weeks from last ov(p prednsione rx)> slt yellow mucus. >>Augmentin x 10 days   11/14/2010 Follow up and Med review  Pt returns for follow up and med review. Since last OV he is doing well. Has occasional cough. On increased SABA use . No ER /Hospital visit or nocturnal awakenings.  We reviewed his med list and updated his med calendar w/ pt education  rec Follow med calendar closely and bring to each visit.  Flu shot today   02/01/2011 f/u ov/Wert did not bring the med calendar out of symbicort x one month cc increase sob > needing  proaire a lot more than he was esp in afternoons, evenings, most of his problems though are related to nasal drainage, watery, not  purulent. rec If there is a less expensive alternative to symbicort on the insurance company plan (called formulary) please let me know Only use your albuterol (proaire)   09/21/2012 f/u ov/Wert  Chief Complaint  Patient presents with  . Follow-up    Increased cough x 6 months- mainly non prod.  Breathing has been doing well.   cough is dry, day >> night,  No sob. Very confusing picture re whether he takes his meds consistently or not, but apparently when he has access to meds he does fine   Sleeping ok without nocturnal  or early am exacerbation  of respiratory  c/o's or need for noct saba. Also denies any obvious fluctuation of symptoms with weather or environmental changes or other aggravating or alleviating factors except as outlined above  ROS  At present neg for  any significant sore throat, dysphagia, fever, chills, sweats, unintended wt loss, pleuritic or exertional cp, hempoptysis, orthopnea pnd or leg swelling.  Also denies presyncope, palpitations, heartburn, abdominal pain, nausea, vomiting, diarrhea  or change in bowel or urinary habits, dysuria,hematuria,  rash, arthralgias, visual complaints, headache, numbness weakness or ataxia.        Past Medical History:  1) pneumonia bilateral, diagnosed on July 31, 2007.  2) CT negative for PE - June 2009  3) VDRF -sept 2009. Admitted for 4 days. Intubated for 1 day  4) HIV Negative - sept 2009 at [facility]  ASTHMA -  - HFA 100% effective tecnique September 27, 2009 > 75% October 31, 2009 > 90% November 29, 2009  Chronic cough ? etiology  - no better on qvar and singulair both stopped September 13, 2009 > no worse off September 27, 2009  - Sinus CT 09/14/09 > pansinusitis  - rx 21 days rx started c augment October 25, 2009 >> repeat sinus ct:neg 11/15/09  - Daily prednisone rx October 31, 2009 >>> try qod dosing January 10, 2010 > tapered off completely 05/2010 Health Maintenance  - Pneumovax given October 31, 2009  COMPLEX MED  REGIMEN-Meds reviewed with pt education and computerized med calendar completed 11/14/09 , 11/14/2010           Objective:   Physical Exam amb vietnamese male limited English, nad  wt 175 September 27, 2009 > 186 April 27, 2010 >    >>170 11/14/2010 >  170 02/01/2011 > 09/22/2012  173 HEENT: [place]/AT, , EACs-clear, TMs-wnl, NOSE-pale , THROAT-clear, no exudate  NECK: Supple w/ fair ROM; no JVD; normal carotid impulses w/o bruits; no thyromegaly or nodules palpated; no lymphadenopathy.  RESP clear to A and P  CARD: RRR, no m/r/g  GI: Soft & nt; nml bowel sounds; no organomegaly or masses detected.  Musco: Warm bil, no calf tenderness edema, clubbing, pulses int   cxr 09/11/12 Right middle lobe pneumonia has almost cleared with slight residual  atelectasis seen on the lateral view.     Assessment:

## 2012-09-22 NOTE — Assessment & Plan Note (Addendum)
-   PFT's 10/24/2010 FEV1  1.89 (53%) ratio 61 and 21% better p B2,  DLCO 97% - HFA 75% effective 09/13/2010  > 90% 10/24/2010   Adequate control on present rx when he has access to symbicort , reviewed > no change in rx needed       Each maintenance medication was reviewed in detail including most importantly the difference between maintenance and as needed and under what circumstances the prns are to be used. This was done in the context of a medication calendar review which provided the patient with a user-friendly unambiguous mechanism for medication administration and reconciliation and provides an action plan for all active problems. It is critical that this be shown to every doctor  for modification during the office visit if necessary so the patient can use it as a working document.

## 2012-10-05 ENCOUNTER — Ambulatory Visit: Payer: No Typology Code available for payment source | Attending: Family Medicine | Admitting: Internal Medicine

## 2012-10-05 VITALS — BP 151/103 | HR 76 | Temp 98.2°F | Resp 16 | Wt 174.2 lb

## 2012-10-05 DIAGNOSIS — J4489 Other specified chronic obstructive pulmonary disease: Secondary | ICD-10-CM | POA: Insufficient documentation

## 2012-10-05 DIAGNOSIS — J441 Chronic obstructive pulmonary disease with (acute) exacerbation: Secondary | ICD-10-CM

## 2012-10-05 DIAGNOSIS — J449 Chronic obstructive pulmonary disease, unspecified: Secondary | ICD-10-CM | POA: Insufficient documentation

## 2012-10-05 DIAGNOSIS — I10 Essential (primary) hypertension: Secondary | ICD-10-CM | POA: Insufficient documentation

## 2012-10-05 LAB — COMPREHENSIVE METABOLIC PANEL
ALT: 19 U/L (ref 0–53)
AST: 22 U/L (ref 0–37)
Albumin: 3.7 g/dL (ref 3.5–5.2)
Alkaline Phosphatase: 57 U/L (ref 39–117)
Potassium: 3.9 mEq/L (ref 3.5–5.3)
Sodium: 140 mEq/L (ref 135–145)
Total Protein: 6.5 g/dL (ref 6.0–8.3)

## 2012-10-05 NOTE — Patient Instructions (Signed)
Patient instructed to repeat blood pressure either here in the clinic or at a local pharmacy at least 3 times before his next clinic appointment

## 2012-10-05 NOTE — Progress Notes (Signed)
Patient here for follow up -pneumonia

## 2012-10-05 NOTE — Progress Notes (Signed)
Patient ID: Nathaniel Hartman, male   DOB: [birth year], 62 y.o.   MRN: [id number]  CC:  HPI:  62 year old male with a history of COPD, previous history of smoking, recent treatment for presumed pneumonia. Increased cough for about 6 months nonproductive, no shortness of breath. Patient denies any chest pain, any orthopnea, does not use oxygen at home. He had a recent followup with Dr. Sandrea Hughs on 8/11 Chest x-ray shows complete resolution of the right middle lobe pneumonia     Allergies  Allergen Reactions  . Acetaminophen    Past Medical History  Diagnosis Date  . Bilateral pneumonia 07/31/2007  . Asthma   . Chronic cough   . Pansinusitis   . COPD (chronic obstructive pulmonary disease)    Current Outpatient Prescriptions on File Prior to Visit  Medication Sig Dispense Refill  . albuterol (PROVENTIL HFA;VENTOLIN HFA) 108 (90 BASE) MCG/ACT inhaler Inhale 2 puffs into the lungs every 6 (six) hours as needed for wheezing.  1 Inhaler  11  . budesonide-formoterol (SYMBICORT) 160-4.5 MCG/ACT inhaler Take 2 puffs first thing in am and then another 2 puffs about 12 hours later.  1 Inhaler  11  . Famotidine (PEPCID PO) Take 1 tablet by mouth at bedtime.       . Omeprazole (PRILOSEC PO) Take 1 capsule by mouth daily.       . [DISCONTINUED] cetirizine (ZYRTEC) 10 MG tablet Take 10 mg by mouth at bedtime as needed.        . [DISCONTINUED] omeprazole (PRILOSEC OTC) 20 MG tablet Take 20 mg by mouth daily.         No current facility-administered medications on file prior to visit.   History reviewed. No pertinent family history. History   Social History  . Marital Status: Married    Spouse Name: N/A    Number of Children: N/A  . Years of Education: N/A   Occupational History  . metal work    Social History Main Topics  . Smoking status: Former Smoker -- 0.50 packs/day for 4 years    Quit date: 02/11/1997  . Smokeless tobacco: Not on file  . Alcohol Use: No  . Drug Use: No  .  Sexual Activity: Not on file   Other Topics Concern  . Not on file   Social History Narrative  . No narrative on file    Review of Systems  Constitutional: Negative for fever, chills, diaphoresis, activity change, appetite change and fatigue.  HENT: Negative for ear pain, nosebleeds, congestion, facial swelling, rhinorrhea, neck pain, neck stiffness and ear discharge.   Eyes: Negative for pain, discharge, redness, itching and visual disturbance.  Respiratory: Negative for cough, choking, chest tightness, shortness of breath, wheezing and stridor.   Cardiovascular: Negative for chest pain, palpitations and leg swelling.  Gastrointestinal: Negative for abdominal distention.  Genitourinary: Negative for dysuria, urgency, frequency, hematuria, flank pain, decreased urine volume, difficulty urinating and dyspareunia.  Musculoskeletal: Negative for back pain, joint swelling, arthralgias and gait problem.  Neurological: Negative for dizziness, tremors, seizures, syncope, facial asymmetry, speech difficulty, weakness, light-headedness, numbness and headaches.  Hematological: Negative for adenopathy. Does not bruise/bleed easily.  Psychiatric/Behavioral: Negative for hallucinations, behavioral problems, confusion, dysphoric mood, decreased concentration and agitation.    Objective:   Filed Vitals:   10/05/12 0923  BP: 151/103  Pulse: 76  Temp: 98.2 F (36.8 C)  Resp: 16    Physical Exam  Constitutional: Appears well-developed and well-nourished. No distress.  HENT: Normocephalic. External right  and left ear normal. Oropharynx is clear and moist.  Eyes: Conjunctivae and EOM are normal. PERRLA, no scleral icterus.  Neck: Normal ROM. Neck supple. No JVD. No tracheal deviation. No thyromegaly.  CVS: RRR, S1/S2 +, no murmurs, no gallops, no carotid bruit.  Pulmonary: Effort and breath sounds normal, no stridor, rhonchi, wheezes, rales.  Abdominal: Soft. BS +,  no distension, tenderness,  rebound or guarding.  Musculoskeletal: Normal range of motion. No edema and no tenderness.  Lymphadenopathy: No lymphadenopathy noted, cervical, inguinal. Neuro: Alert. Normal reflexes, muscle tone coordination. No cranial nerve deficit. Skin: Skin is warm and dry. No rash noted. Not diaphoretic. No erythema. No pallor.  Psychiatric: Normal mood and affect. Behavior, judgment, thought content normal.   Lab Results  Component Value Date   WBC 8.3 08/21/2012   HGB 13.6 08/21/2012   HCT 40.3 08/21/2012   MCV 78.1 08/21/2012   PLT 314 08/21/2012   Lab Results  Component Value Date   CREATININE 1.42* 09/09/2012   BUN 18 09/09/2012   NA 142 09/09/2012   K 4.1 09/09/2012   CL 109 09/09/2012   CO2 30 09/09/2012    Lab Results  Component Value Date   HGBA1C 5.5 08/21/2012   Lipid Panel     Component Value Date/Time   CHOL 178 08/21/2012 1225   TRIG 123 08/21/2012 1225   HDL 35* 08/21/2012 1225   CHOLHDL 5.1 08/21/2012 1225   VLDL 25 08/21/2012 1225   LDLCALC 118* 08/21/2012 1225       Assessment and plan:   Patient Active Problem List   Diagnosis Date Noted  . CAP (community acquired pneumonia) 09/04/2012  . COPD exacerbation 07/24/2012  . Chronic rhinitis 10/24/2010  . COPD GOLD II with marked reversibility  11/30/2007  . G E R D 11/05/2007   COPD Patient to continue with his current medications He does have some occupational exposure, advised to use strict environmental precautions at work  Acute renal insufficiency    Creatinine of 1.66 on 7/11 now trending down Repeat bmet today  Hypertension systolic blood pressure 151 No prior history of hypertension Patient advised to monitor his blood pressure at local pharmacy and write them down    The patient was given clear instructions to go to ER or return to medical center if symptoms don't improve, worsen or new problems develop. The patient verbalized understanding. The patient was told to call to get any lab results if not  heard anything in the next week.

## 2012-12-07 ENCOUNTER — Encounter: Payer: Self-pay | Admitting: Internal Medicine

## 2012-12-07 ENCOUNTER — Ambulatory Visit: Payer: No Typology Code available for payment source | Attending: Internal Medicine | Admitting: Internal Medicine

## 2012-12-07 VITALS — BP 162/112 | HR 60 | Temp 97.7°F | Resp 16 | Ht 73.0 in | Wt 185.0 lb

## 2012-12-07 DIAGNOSIS — I10 Essential (primary) hypertension: Secondary | ICD-10-CM | POA: Insufficient documentation

## 2012-12-07 DIAGNOSIS — J441 Chronic obstructive pulmonary disease with (acute) exacerbation: Secondary | ICD-10-CM

## 2012-12-07 DIAGNOSIS — Z23 Encounter for immunization: Secondary | ICD-10-CM

## 2012-12-07 MED ORDER — LISINOPRIL-HYDROCHLOROTHIAZIDE 20-12.5 MG PO TABS: 2.0000 | ORAL_TABLET | Freq: Every day | ORAL | Status: DC

## 2012-12-07 NOTE — Progress Notes (Signed)
Pt is here for a f/u visit. Pt reports that the COPD, bronchitis and GERD are much better. Pt is requesting preventive medications for the flu season. Pt needs a refill on his medications.

## 2012-12-07 NOTE — Patient Instructions (Signed)
B?nh T?c Ngh?n Ph?i Mn Tnh  (Chronic Obstructive Pulmonary Disease)  B?nh t?c ngh?n ph?i mn tnh (COPD) l tnh tr?ng m trong ? dng kh t? ph?i b? h?n ch?. Ph?i c th? khng bao gi? tr? l?i bnh th??ng, nh?ng c nh?ng bi?n php c th? s? c?i thi?n chng v lm cho b?n c?m th?y t?t h?n. NGUYN NHN   Ht thu?c l.  Ti?p xc v?i khi thu?c l th? ??ng.  Ht ph?i ch?t kch thch, ch?ng h?n nh?  nhi?m khng kh, b?i, khi thu?c l, mi m?nh, bnh phun x?t ho?c h?i s?n.  Ti?n s? nhi?m trng ph?i. TRI?U CH?NG   Ho su, ko di (mn tnh) v?i m?t l??ng l?n d?ch nh?y ??c.  Th? kh kh.  Th? d?c, ??c bi?t khi tham gia vo ho?t ??ng th? ch?t.  C?m gic nh? b?n khng th? c ?? khng kh.  Kh th?.  Th? nhanh.  Da ??i mu xm ho?c h?i xanh (xanh tnh), ??c bi?t ? cc ngn tay, ngn chn ho?c mi.  M?t m?i.  Gi?m cn.  S?ng ? chn, m?t c chn ho?c bn chn.  Nh?p tim nhanh.  Th??ng xuyn b? nhi?m trng ph?i.   T?c ng?c. CH?N ?ON  Ch?n ?on ban ??u c th? d?a vo b?nh s?, tri?u ch?ng c?a b?n v khm tr?c ti?p. Xt nghi?m b? sung cho COPD c th? bao g?m:   Ch?p X quang ng?c.  Ch?p c?t l?p vi tnh (CT).  Ki?m tra ch?c n?ng ph?i.  Xt nghi?m mu. ?I?U TR?  ?i?u tr? t?p trung vo vi?c lm cho b?n tho?i mi (ch?m [place] h? tr?). Chuyn gia ch?m [place] y t? c th? k thu?c (ht ho?c thu?c vin) gip c?i thi?n kh? n?ng th? c?a b?n. L?a ch?n ?i?u tr? b? sung c th? bao g?m li?u php oxy v ph?c h?i ch?c n?ng ph?i. ?i?u tr? c?ng s? bao g?m vi?c gi?m ti?p xc v?i ch?t kch thch ? bi?t v tun th? k? ho?ch b? thu?c.  H??NG D?N CH?M [place] T?I NH   S? d?ng t?t c? thu?c bao g?m c? thu?c khng sinh theo ch? d?n c?a chuyn gia ch?m [place] y t?.  S? d?ng thu?c ht theo ch? d?n c?a chuyn gia ch?m [place] y t?.  Trnh s? d?ng thu?c ho?c xi-r ho lm kh ???ng h h?p c?a b?n (thu?c khng hixtamin) v lm ch?m vi?c lo?i b? cc ch?t bi ti?t. ?i?u ny lm gi?m kh? n?ng h h?p v c th? d?n ??n  nhi?m trng.  N?u b?n ht thu?c, hy b? ht thu?c.  Trnh ti?p xc v?i khi thu?c, ha ch?t v khi lm tr?m tr?ng thm h?i th? c?a b?n.  Trnh ti?p xc v?i nh?ng ng??i c b?nh truy?n nhi?m.  [place] thay ??i nhi?t ?? v ?? ?m kh?c nghi?t.  S? d?ng my t?o ?? ?m ? nh v ? c?nh gi??ng ng? c?a b?n n?u chng khng gy kh th?.  U?ng ?? n??c v dung d?ch ?? n??c ti?u trong ho?c c mu vng nh?t. ?i?u ny lm l?ng ch?t bi ti?t.  ?n th?c ph?m c l?i cho s?c kh?e. ?n nhi?u b?a nh? h?n, th??ng xuyn h?n v ngh? ng?i tr??c khi ?n c th? gip b?n duy tr s?c m?nh c?a mnh.  H?i chuyn gia ch?m [place] y t? c?a b?n v? vi?c s? d?ng vitamin v khong ch?t b? sung.  S?ng n?ng ??ng. T?p th? d?c v ho?t ??ng th? ch?t s? gip duy tr kh?  n?ng lm nh?ng ?i?u b?n mu?n lm.  Cn b?ng ho?t ??ng v?i th?i gian ngh? ng?i.  Tm m?t v? tr tho?i mi n?u b?n tr? nn kh th?.  Tm hi?u v s? d?ng cc k? thu?t th? gin.  Tm hi?u v s? d?ng cc k? thu?t th? c ki?m sot theo ch? d?n c?a chuyn gia ch?m [place] y t?. K? thu?t th? c ki?m sot bao g?m:  Th? mm mi. K? thu?t th? ny b?t ??u v?i vi?c th? vo (ht) b?ng m?i trong 1 giy. Ti?p theo, mm mi nh? th? b?n ??nh hut so. Sau ? th? ra (thot kh) qua mi mm trong 2 giy.  Th? b?ng c? honh. B?t ??u b?ng cch ??t m?t tay ln b?ng, ngay trn th?t l?ng c?a b?n. Ht vo t? t? b?ng m?i. Tay trn b?ng c?n di chuy?n ra ngoi. Sau ? th? ra t? t? qua mi mm. B?n s? c th? c?m nh?n ???c tay trn b?ng di chuy?n trong khi b?n th? ra.  Tm hi?u v s? d?ng ho c ki?m sot ?? ??y d?ch nh?y ra kh?i ph?i. Ho c ki?m sot l m?t lo?t ho ng?n, t?ng d?n. Cc b??c c?a ho c ki?m sot l: 1. H?i nghing ??u v? pha tr??c. 2. Th? su s? d?ng th? b?ng c? honh. 3. C? g?ng nn th? trong 3 giy. 4. Gi? mi?ng h?i m? trong khi ho hai l?n. 5. Nh? m?i d?ch nh?y vo kh?n gi?y. 6. Ngh? ng?i v l?p l?i cc b??c ny m?t l?n ho?c hai l?n khi c?n thi?t.  S? d?ng t?t c? cc lo?i v?cxin  b?o v? ???c chuyn gia ch?m [place] y t? c?a b?n ?? xu?t, ??c bi?t l v?cxin ph? c?u khu?n v v?cxin cm.  H?c qu?n l c?ng th?ng.  ??t l?ch v gi? m?i cu?c h?n khm l?i theo ch? d?n c?a chuyn gia ch?m [place] y t?. B?n c?n gi? t?t c? cc cu?c h?n.  Tham gia ph?c h?i ch?c n?ng ph?i theo ch? d?n c?a chuyn gia ch?m [place] y t?.  S? d?ng oxy t?i nh nh? ???c ?? xu?t. HY THAM V?N V?I CHUYN GIA Y T? N?U:   B?n b? ho ra nhi?u d?ch nh?y h?n bnh th??ng.  C s? thay ??i v? mu s?c ho?c ?? ??c c?a d?ch nh?y.  Th? v?t v? h?n bnh th??ng.  Th? nhanh h?n bnh th??ng.  Mu da c?a b?n xanh tm h?n bnh th??ng.  B?n h?t thu?c s? d?ng cho vi?c th? c?a b?n.  B?n ?ang lo l?ng, s? hi hay b?n ch?n.  B?n b? s?t. HY NGAY L?P T?C THAM V?N V?I CHUYN GIA Y T? N?U:   Tim ??p nhanh.  B?n b? kh th? trong khi ngh? ng?i.  B?n b? kh th? khi?n b?n khng th? ni chuy?n.  B?n b? kh th? khi?n b?n khng th? th?c hi?n nh?ng ho?t ??ng th? ch?t bnh th??ng.  B?n b? ?au ng?c ko di h?n 5 pht.  B?n b? ??ng kinh.  Milagros Loll ?nh ho?c b?n b c?a b?n nh?n th?y b?n b? kch ??ng ho?c l?n l?n. ??M B?O B?N:   Hi?u cc h??ng d?n ny.  S? theo di tnh tr?ng c?a mnh.  S? yu c?u tr? gip ngay l?p t?c n?u b?n c?m th?y khng kh?e ho?c tnh tr?ng tr? nn t?i h?n. Document Released: 11/07/2004 Document Revised: 10/23/2011 [facility] Patient Information 2014 [place], [place]. T?ng Huy?t p (Hypertension) Khi tim ??p, n ??y mu l?u thng qua cc ??ng  m?ch. L?c ??y ny ???c g?i l huy?t p. N?u huy?t p qu cao, ng??i ta g?i ? l ch?ng t?ng huy?t p (HTN) hay cao huy?t p. Ch?ng t?ng huy?t p r?t nguy hi?m v b?n c th? m?c ph?i m khng hay bi?t g. Huy?t p cao c ngh?a l tim c?a b?n ph?i lm vi?c nhi?u h?n ?? b?m mu. Cc ??ng m?ch c th? b? h?p ho?c x? c?ng. T?ng cng cho tim lm b?n c nguy c? m?c b?nh tim, ??t qu?, v cc b?nh l khc. Huy?t p bao g?m hai con s?, m?t ch? s? cao h?n trn m?t ch? s? th?p h?n, v d? nh?  110/72. Huy?t p ???c ghi l "110 trn 72". L t??ng l d??i 120 cho ch? s? trn (tm thu) v d??i 80 cho ch? s? d??i (tm tr??ng). Ghi huy?t p c?a b?n ngy hm nay.  B?n nn h?t s?c ch  ??n huy?t p c?a mnh n?u ?ang m?c ph?i nh?ng c?n b?nh no ? nh? l:  Suy tim  Ti?n s? b? ?au tim  Ti?u ???ng  B?nh th?n mn tnh  Ti?n s? ??t qu?  Nhi?u nguy c? m?c b?nh tim. ?? xem c b? m?c ch?ng t?ng huy?t p hay khng, b?n nn ?o huy?t p khi ?ang ng?i v?i cnh tay ???c ??t ngang t?m tim c?a b?n. Nn ?o huy?t p t nh?t hai l?n. Ch? s? huy?t p cao m?t l?n (??c bi?t l ? [place] C?p C?u) khng c ngh?a l b?n c?n ph?i ???c ?i?u tr?. C th? c cc c?n b?nh m huy?t p khc nhau gi?a tay tri v tay ph?i. ?i?u quan tr?ng l ph?i s?m ?i khm ?? Bc s? ki?m tra l?i. ?a s? ng??i m?c ph?i ch?ng t?ng huy?t p nguyn pht, c ngh?a l khng c nguyn nhn c? th?. C th? lm gi?m d?ng cao huy?t p ny b?ng cch thay ??i y?u t? l?i s?ng nh? l:  C?ng th?ng  Ht thu?c  Thi?u t?p th? d?c  Th?a cn  S? d?ng ma ty/thu?c l/r??u  Ch? ?? ?n t mu?i ?a s? ng??i khng c cc tri?u ch?ng do cao huy?t p cho ??n khi b?nh gy t?n h?i ??n c? th?. Vi?c ?i?u tr? hi?u qu? th??ng c th? ng?n ch?n, tr hon hay gi?m m?c ?? t?n h?i ?. ?I?U TR? Vi?c ?i?u tr? ch?ng cao huy?t p, khi ? xc ??nh ???c nguyn nhn, ???c nh?m vo nguyn nhn gy b?nh ?. C r?t nhi?u thu?c ?i?u tr? ch?ng t?ng huy?t p. Cc thu?c ny c th? ???c chia ra thnh vi lo?i, v Bc s? s? gip b?n ch?n thu?c t?t nh?t cho mnh. Thu?c c th? c cc tc d?ng ph?. B?n v Bc s? nn xem l?i nh?ng tc d?ng ph? ?. N?u huy?t p v?n cao sau khi b?n ? thay ??i l?i s?ng ho?c b?t ??u dng thu?c th,  C th? c?n ph?i thay ??i (cc) thu?c.  C th? c?n ph?i ch tm ??n nh?ng v?n ?? khc.  Ph?i ch?c ch?n r?ng b?n hi?u toa thu?c, v bi?t r khi no dng thu?c v dng thu?c nh? th? no.  Ph?i ch?c ch?n r?ng b?n g?p Bc s? ?? ti khm trong khung th?i gian ???c  khuy?n co (th??ng l trong vng hai tu?n) ?? ki?m tra l?i huy?t p v xem l?i thu?c.  N?u dng nhi?u h?n m?t lo?i thu?c ?? h? huy?t p, ph?i ch?c ch?n r?ng b?n bi?t khi no nn dng thu?c v  dng nh? th? no. Dng cng lc hai lo?i thu?c c th? d?n ??n huy?t p h? qu th?p. NH?P VI?N NGAY L?P T?C N?U XU?T HI?N:  Nh?c ??u d? d?i, th? l?c thay ??i hay b? m?, ho?c l l?n.  Y?u ho?c t li?t b?t th??ng, ho?c c c?m gic chong ng?t.  ?au b?ng hay ng?c tr?m tr?ng, i m?a, ho?c kh th?. HY CH?C CH?N R?NG B?N:  Hi?u nh?ng ch? d?n ny.  S? theo di tnh tr?ng c?a b?n.  S? nh?n ???c s? gip ?? ngay l?p t?c n?u b?n khng kh?e ho?c tr? nn t? h?n. Document Released: 01/28/2005 Document Revised: 04/22/2011 [facility] Patient Information 2014 [place], [place].

## 2012-12-07 NOTE — Progress Notes (Signed)
Patient ID: Nathaniel Hartman, male   DOB: 02/13/1950, 62 y.o.   MRN: [id number] Patient Demographics  Nathaniel Hartman, is a 62 y.o. male  NWG:[id number]  [id number]  DOB - 1950-11-16  Chief Complaint  Patient presents with  . Follow-up        Subjective:   Nathaniel Hartman is a 62 y.o. male here today for a follow up visit. No new complaints today. Recently followed up with pulmonologist, medications are updated, patient is compliant. No cough at the moment. Patient does very well with Z-Pak and steroid during exacerbation.  Patient has No headache, No chest pain, No abdominal pain - No Nausea, No new weakness tingling or numbness, No Cough - SOB.  ALLERGIES: Allergies  Allergen Reactions  . Acetaminophen     PAST MEDICAL HISTORY: Past Medical History  Diagnosis Date  . Bilateral pneumonia 07/31/2007  . Asthma   . Chronic cough   . Pansinusitis   . COPD (chronic obstructive pulmonary disease)     MEDICATIONS AT HOME: Prior to Admission medications   Medication Sig Start Date End Date Taking? Authorizing Provider  albuterol (PROVENTIL HFA;VENTOLIN HFA) 108 (90 BASE) MCG/ACT inhaler Inhale 2 puffs into the lungs every 6 (six) hours as needed for wheezing. 07/20/12  Yes Dorothea Ogle, MD  budesonide-formoterol [facility]) 160-4.5 MCG/ACT inhaler Take 2 puffs first thing in am and then another 2 puffs about 12 hours later. 09/04/12  Yes Ripudeep Jenna Luo, MD  Famotidine (PEPCID PO) Take 1 tablet by mouth at bedtime.    Yes Historical Provider, MD  Omeprazole (PRILOSEC PO) Take 1 capsule by mouth daily.    Yes Historical Provider, MD  lisinopril-hydrochlorothiazide (ZESTORETIC) 20-12.5 MG per tablet Take 2 tablets by mouth daily. 12/07/12   Jeanann Lewandowsky, MD     Objective:   Filed Vitals:   12/07/12 0934  BP: 162/112  Pulse: 60  Temp: 97.7 F (36.5 C)  TempSrc: Oral  Resp: 16  Height: 6\' 1"  (1.854 m)  Weight: 185 lb (83.915 kg)  SpO2: 100%    Exam General appearance  : Awake, alert, not in any distress. Speech Clear. Not toxic looking HEENT: Atraumatic and Normocephalic, pupils equally reactive to light and accomodation Neck: supple, no JVD. No cervical lymphadenopathy.  Chest:Good air entry bilaterally, right more than left, no added sounds  CVS: S1 S2 regular, no murmurs.  Abdomen: Bowel sounds present, Non tender and not distended with no gaurding, rigidity or rebound. Extremities: B/L Lower Ext shows no edema, both legs are warm to touch Neurology: Awake alert, and oriented X 3, CN II-XII intact, Non focal Skin:No Rash Wounds:N/A   Data Review   CBC No results found for this basename: WBC, HGB, HCT, PLT, MCV, MCH, MCHC, RDW, NEUTRABS, LYMPHSABS, MONOABS, EOSABS, BASOSABS, BANDABS, BANDSABD,  in the last 168 hours  Chemistries   No results found for this basename: NA, K, CL, CO2, GLUCOSE, BUN, CREATININE, GFRCGP, CALCIUM, MG, AST, ALT, ALKPHOS, BILITOT,  in the last 168 hours ------------------------------------------------------------------------------------------------------------------ No results found for this basename: HGBA1C,  in the last 72 hours ------------------------------------------------------------------------------------------------------------------ No results found for this basename: CHOL, HDL, LDLCALC, TRIG, CHOLHDL, LDLDIRECT,  in the last 72 hours ------------------------------------------------------------------------------------------------------------------ No results found for this basename: TSH, T4TOTAL, FREET3, T3FREE, THYROIDAB,  in the last 72 hours ------------------------------------------------------------------------------------------------------------------ No results found for this basename: VITAMINB12, FOLATE, FERRITIN, TIBC, IRON, RETICCTPCT,  in the last 72 hours  Coagulation profile  No results found for this basename: INR, PROTIME,  in the  last 168 hours    Assessment & Plan   Patient Active Problem  List   Diagnosis Date Noted  . Essential hypertension, benign 12/07/2012  . CAP (community acquired pneumonia) 09/04/2012  . COPD exacerbation 07/24/2012  . Chronic rhinitis 10/24/2010  . COPD GOLD II with marked reversibility  11/30/2007  . G E R D 11/05/2007     Plan: Uncontrolled hypertension Increase lisinopril-hydrochlorothiazide to 20-12.5 mg 2 tablets by mouth daily Continue to monitor ambulatory blood pressure log, we'll call patient to make sure blood pressure is controlled Continue other medications as prescribed  COPD Continue the rescue inhaler when necessary and Symbicort every 12 hours Oral hygiene emphasized Patient has been extensively counseled about nutrition and exercise Patient has been encouraged to follow up with pulmonologist for COPD   Health Maintenance -Colonoscopy: Already scheduled   -Vaccinations:  -Influenza: Given today  Follow up in 3 months or when necessary  Interpreter was used to communicate directly with patient for the entire encounter including providing detailed patient instructions.   The patient was given clear instructions to go to ER or return to medical center if symptoms don't improve, worsen or new problems develop. The patient verbalized understanding. The patient was told to call to get lab results if they haven't heard anything in the next week.    Jeanann Lewandowsky, MD, MHA, FACP, FAAP [facility] [place], [place] [phone]   12/07/2012, 9:49 AM

## 2012-12-30 ENCOUNTER — Inpatient Hospital Stay (HOSPITAL_COMMUNITY): Payer: Medicaid Other

## 2012-12-30 ENCOUNTER — Emergency Department (HOSPITAL_COMMUNITY): Payer: Medicaid Other

## 2012-12-30 ENCOUNTER — Encounter (HOSPITAL_COMMUNITY): Payer: Self-pay | Admitting: Emergency Medicine

## 2012-12-30 ENCOUNTER — Inpatient Hospital Stay (HOSPITAL_COMMUNITY)
Admission: EM | Admit: 2012-12-30 | Discharge: 2012-12-31 | DRG: 208 | Disposition: A | Discharge status: Home / Self Care | Payer: Medicaid Other | Attending: Pulmonary Disease | Admitting: Pulmonary Disease

## 2012-12-30 DIAGNOSIS — G9340 Encephalopathy, unspecified: Secondary | ICD-10-CM | POA: Diagnosis present

## 2012-12-30 DIAGNOSIS — J9600 Acute respiratory failure, unspecified whether with hypoxia or hypercapnia: Principal | ICD-10-CM | POA: Diagnosis present

## 2012-12-30 DIAGNOSIS — N179 Acute kidney failure, unspecified: Secondary | ICD-10-CM | POA: Diagnosis present

## 2012-12-30 DIAGNOSIS — T7840XA Allergy, unspecified, initial encounter: Secondary | ICD-10-CM

## 2012-12-30 DIAGNOSIS — Z87891 Personal history of nicotine dependence: Secondary | ICD-10-CM

## 2012-12-30 DIAGNOSIS — J441 Chronic obstructive pulmonary disease with (acute) exacerbation: Secondary | ICD-10-CM

## 2012-12-30 HISTORY — DX: Gastro-esophageal reflux disease without esophagitis: K21.9

## 2012-12-30 HISTORY — DX: Chronic kidney disease, unspecified: N18.9

## 2012-12-30 HISTORY — DX: Essential (primary) hypertension: I10

## 2012-12-30 HISTORY — DX: Shortness of breath: R06.02

## 2012-12-30 LAB — LACTIC ACID, PLASMA
Lactic Acid, Venous: 2.5 mmol/L — ABNORMAL HIGH (ref 0.5–2.2)
Lactic Acid, Venous: 3.4 mmol/L — ABNORMAL HIGH (ref 0.5–2.2)
Lactic Acid, Venous: 4.7 mmol/L — ABNORMAL HIGH (ref 0.5–2.2)

## 2012-12-30 LAB — URINALYSIS, ROUTINE W REFLEX MICROSCOPIC
Bilirubin Urine: NEGATIVE
Glucose, UA: NEGATIVE mg/dL
Ketones, ur: NEGATIVE mg/dL
Nitrite: NEGATIVE
Specific Gravity, Urine: 1.013 (ref 1.005–1.030)
Urobilinogen, UA: 0.2 mg/dL (ref 0.0–1.0)

## 2012-12-30 LAB — CBC
HCT: 35.7 % — ABNORMAL LOW (ref 39.0–52.0)
MCHC: 35 g/dL (ref 30.0–36.0)
MCV: 80.2 fL (ref 78.0–100.0)
Platelets: 192 10*3/uL (ref 150–400)
RBC: 4.45 MIL/uL (ref 4.22–5.81)
RDW: 14.1 % (ref 11.5–15.5)

## 2012-12-30 LAB — MRSA PCR SCREENING: MRSA by PCR: NEGATIVE

## 2012-12-30 LAB — COMPREHENSIVE METABOLIC PANEL
ALT: 28 U/L (ref 0–53)
Albumin: 3.2 g/dL — ABNORMAL LOW (ref 3.5–5.2)
Alkaline Phosphatase: 73 U/L (ref 39–117)
BUN: 24 mg/dL — ABNORMAL HIGH (ref 6–23)
Calcium: 9.2 mg/dL (ref 8.4–10.5)
Potassium: 4.7 mEq/L (ref 3.5–5.1)
Sodium: 140 mEq/L (ref 135–145)
Total Protein: 7.1 g/dL (ref 6.0–8.3)

## 2012-12-30 LAB — POCT I-STAT 3, ART BLOOD GAS (G3+)
Acid-base deficit: 4 mmol/L — ABNORMAL HIGH (ref 0.0–2.0)
Bicarbonate: 26.3 mEq/L — ABNORMAL HIGH (ref 20.0–24.0)
Patient temperature: 98.6
pH, Arterial: 7.156 — CL (ref 7.350–7.450)

## 2012-12-30 LAB — BASIC METABOLIC PANEL
BUN: 22 mg/dL (ref 6–23)
CO2: 20 mEq/L (ref 19–32)
Calcium: 8.9 mg/dL (ref 8.4–10.5)
Chloride: 104 mEq/L (ref 96–112)
Creatinine, Ser: 1.26 mg/dL (ref 0.50–1.35)

## 2012-12-30 LAB — CBC WITH DIFFERENTIAL/PLATELET
Basophils Relative: 0 % (ref 0–1)
Eosinophils Absolute: 1.1 10*3/uL — ABNORMAL HIGH (ref 0.0–0.7)
MCH: 28.1 pg (ref 26.0–34.0)
MCHC: 34.2 g/dL (ref 30.0–36.0)
Neutrophils Relative %: 23 % — ABNORMAL LOW (ref 43–77)
Platelets: 196 10*3/uL (ref 150–400)

## 2012-12-30 LAB — URINE MICROSCOPIC-ADD ON

## 2012-12-30 LAB — PROCALCITONIN
Procalcitonin: <0.1 ng/mL
Procalcitonin: 0.34 ng/mL

## 2012-12-30 LAB — POCT I-STAT TROPONIN I: Troponin i, poc: 0 ng/mL (ref 0.00–0.08)

## 2012-12-30 LAB — PHOSPHORUS: Phosphorus: 4.8 mg/dL — ABNORMAL HIGH (ref 2.3–4.6)

## 2012-12-30 LAB — TROPONIN I
Troponin I: <0.3 ng/mL (ref <0.30)
Troponin I: <0.3 ng/mL (ref <0.30)

## 2012-12-30 LAB — MAGNESIUM: Magnesium: 2 mg/dL (ref 1.5–2.5)

## 2012-12-30 LAB — GLUCOSE, CAPILLARY: Glucose-Capillary: 96 mg/dL (ref 70–99)

## 2012-12-30 MED ORDER — SODIUM CHLORIDE 0.9 % IV SOLN
25.0000 ug/h | INTRAVENOUS | Status: DC
  Administered 2012-12-30: 25 ug/h via INTRAVENOUS
  Filled 2012-12-30: qty 50

## 2012-12-30 MED ORDER — PANTOPRAZOLE SODIUM 40 MG IV SOLR
40.0000 mg | INTRAVENOUS | Status: DC
  Administered 2012-12-31: 40 mg via INTRAVENOUS
  Filled 2012-12-30 (×2): qty 40

## 2012-12-30 MED ORDER — FENTANYL CITRATE 0.05 MG/ML IJ SOLN
INTRAMUSCULAR | Status: AC
  Filled 2012-12-30: qty 2

## 2012-12-30 MED ORDER — FENTANYL CITRATE 0.05 MG/ML IJ SOLN: 50.0000 ug | INTRAMUSCULAR | Status: DC | PRN

## 2012-12-30 MED ORDER — SUCCINYLCHOLINE CHLORIDE 20 MG/ML IJ SOLN
100.0000 mg | Freq: Once | INTRAMUSCULAR | Status: AC
  Administered 2012-12-30: 100 mg via INTRAVENOUS

## 2012-12-30 MED ORDER — PROPOFOL 10 MG/ML IV EMUL
INTRAVENOUS | Status: AC
  Administered 2012-12-30: 50 ug/kg/min via INTRAVENOUS
  Filled 2012-12-30: qty 100

## 2012-12-30 MED ORDER — MAGNESIUM SULFATE 40 MG/ML IJ SOLN
2.0000 g | Freq: Once | INTRAMUSCULAR | Status: AC
  Administered 2012-12-30: 2 g via INTRAVENOUS
  Filled 2012-12-30: qty 50

## 2012-12-30 MED ORDER — LEVOFLOXACIN IN D5W 750 MG/150ML IV SOLN
750.0000 mg | INTRAVENOUS | Status: DC
  Administered 2012-12-31: 750 mg via INTRAVENOUS
  Filled 2012-12-30 (×2): qty 150

## 2012-12-30 MED ORDER — ALBUTEROL SULFATE HFA 108 (90 BASE) MCG/ACT IN AERS
6.0000 | INHALATION_SPRAY | RESPIRATORY_TRACT | Status: DC
  Administered 2012-12-30 – 2012-12-31 (×7): 6 via RESPIRATORY_TRACT
  Filled 2012-12-30: qty 6.7

## 2012-12-30 MED ORDER — PROPOFOL 10 MG/ML IV EMUL
5.0000 ug/kg/min | INTRAVENOUS | Status: DC
  Administered 2012-12-30: 50 ug/kg/min via INTRAVENOUS

## 2012-12-30 MED ORDER — CHLORHEXIDINE GLUCONATE 0.12 % MT SOLN
15.0000 mL | Freq: Two times a day (BID) | OROMUCOSAL | Status: DC
  Administered 2012-12-30: 15 mL via OROMUCOSAL
  Filled 2012-12-30: qty 15

## 2012-12-30 MED ORDER — PNEUMOCOCCAL VAC POLYVALENT 25 MCG/0.5ML IJ INJ
0.5000 mL | INJECTION | INTRAMUSCULAR | Status: AC
  Administered 2012-12-31: 0.5 mL via INTRAMUSCULAR
  Filled 2012-12-30: qty 0.5

## 2012-12-30 MED ORDER — SODIUM CHLORIDE 0.9 % IV SOLN: INTRAVENOUS | Status: DC

## 2012-12-30 MED ORDER — SODIUM CHLORIDE 0.9 % IV BOLUS (SEPSIS)
500.0000 mL | Freq: Once | INTRAVENOUS | Status: AC
  Administered 2012-12-30: 500 mL via INTRAVENOUS

## 2012-12-30 MED ORDER — BIOTENE DRY MOUTH MT LIQD
1.0000 "application " | Freq: Four times a day (QID) | OROMUCOSAL | Status: DC
  Administered 2012-12-30 (×2): 15 mL via OROMUCOSAL

## 2012-12-30 MED ORDER — SODIUM CHLORIDE 0.9 % IV BOLUS (SEPSIS): 500.0000 mL | INTRAVENOUS | Status: AC

## 2012-12-30 MED ORDER — FENTANYL CITRATE 0.05 MG/ML IJ SOLN
100.0000 ug | Freq: Once | INTRAMUSCULAR | Status: AC
  Administered 2012-12-30: 100 ug via INTRAVENOUS

## 2012-12-30 MED ORDER — ALBUTEROL SULFATE HFA 108 (90 BASE) MCG/ACT IN AERS: 6.0000 | INHALATION_SPRAY | RESPIRATORY_TRACT | Status: DC | PRN

## 2012-12-30 MED ORDER — ETOMIDATE 2 MG/ML IV SOLN
0.3000 mg/kg | Freq: Once | INTRAVENOUS | Status: AC
  Administered 2012-12-30: 25.2 mg via INTRAVENOUS

## 2012-12-30 MED ORDER — METHYLPREDNISOLONE SODIUM SUCC 125 MG IJ SOLR
80.0000 mg | Freq: Three times a day (TID) | INTRAMUSCULAR | Status: DC
  Administered 2012-12-30 – 2012-12-31 (×4): 80 mg via INTRAVENOUS
  Filled 2012-12-30 (×7): qty 1.28
  Filled 2012-12-30: qty 2
  Filled 2012-12-30 (×2): qty 1.28

## 2012-12-30 MED ORDER — HEPARIN SODIUM (PORCINE) 5000 UNIT/ML IJ SOLN
5000.0000 [IU] | Freq: Three times a day (TID) | INTRAMUSCULAR | Status: DC
  Administered 2012-12-30 – 2012-12-31 (×4): 5000 [IU] via SUBCUTANEOUS
  Filled 2012-12-30 (×7): qty 1

## 2012-12-30 MED ORDER — IPRATROPIUM BROMIDE HFA 17 MCG/ACT IN AERS
6.0000 | INHALATION_SPRAY | RESPIRATORY_TRACT | Status: DC
  Administered 2012-12-30 – 2012-12-31 (×8): 6 via RESPIRATORY_TRACT
  Filled 2012-12-30: qty 12.9

## 2012-12-30 MED FILL — Succinylcholine Chloride Inj 20 MG/ML: INTRAMUSCULAR | Qty: 5 | Status: AC

## 2012-12-30 NOTE — ED Provider Notes (Signed)
CSN: [id number]     Arrival date & time 12/30/12  0117 History   First MD Initiated Contact with Patient 12/30/12 0114     Chief Complaint  Patient presents with  . Shortness of Breath   (Consider location/radiation/quality/duration/timing/severity/associated sxs/prior Treatment) Patient is a 62 y.o. male presenting with shortness of breath. The history is provided by the patient and a relative. The history is limited by the condition of the patient (Severe dyspnea). A language interpreter was used.  Shortness of Breath He became acutely short of breath at midnight tonight. He did fine during the day. His son states that he has had a slight cough but no fever or chills. He has not complained of pain was called and noted that he was anxious and in respiratory distress with hypoxia. Oxygen saturations were initially 80% and then dropped to 64%. He was given methylprednisolone, albuterol, and ipratropium with notable improvement. He has been intubated in the past. His son states that he is a former smoker.  Past Medical History  Diagnosis Date  . Bilateral pneumonia 07/31/2007  . Asthma   . Chronic cough   . Pansinusitis   . COPD (chronic obstructive pulmonary disease)    Past Surgical History  Procedure Laterality Date  . Left hand surgery  08/2006   History reviewed. No pertinent family history. History  Substance Use Topics  . Smoking status: Former Smoker -- 0.50 packs/day for 4 years    Quit date: 02/11/1997  . Smokeless tobacco: Not on file  . Alcohol Use: No    Review of Systems  Unable to perform ROS: Acuity of condition  Respiratory: Positive for shortness of breath.     Allergies  Acetaminophen  Home Medications   Current Outpatient Rx  Name  Route  Sig  Dispense  Refill  . albuterol (PROVENTIL HFA;VENTOLIN HFA) 108 (90 BASE) MCG/ACT inhaler   Inhalation   Inhale 2 puffs into the lungs every 6 (six) hours as needed for wheezing.   1 Inhaler   11   .  budesonide-formoterol (SYMBICORT) 160-4.5 MCG/ACT inhaler      Take 2 puffs first thing in am and then another 2 puffs about 12 hours later.   1 Inhaler   11   . Famotidine (PEPCID PO)   Oral   Take 1 tablet by mouth at bedtime.          Marland Kitchen lisinopril-hydrochlorothiazide (ZESTORETIC) 20-12.5 MG per tablet   Oral   Take 2 tablets by mouth daily.   180 tablet   3   . Omeprazole (PRILOSEC PO)   Oral   Take 1 capsule by mouth daily.           BP 192/114  Resp 25  SpO2 73% Physical Exam  Nursing note and vitals reviewed.  62 year old male, in severe respiratory distress. He is anxious and is noted to be using accessory muscles of respiration. Vital signs are significant for tachypnea with respiratory rate of 25, and hypertension with blood pressure 102/114, and tachycardia with heart rate of 124. Oxygen saturation is 73%, which is hypoxic. He had a breathing treatment in process on arrival. Head is normocephalic and atraumatic. PERRLA, EOMI. Oropharynx is clear. Neck is nontender and supple without adenopathy or JVD. Back is nontender and there is no CVA tenderness. Lungs have decreased breath sounds at the right base, and diffuse wheezes. No rales or rhonchi are heard. Chest is nontender. Heart has regular rate and rhythm without murmur. Abdomen  is soft, flat, nontender without masses or hepatosplenomegaly and peristalsis is normoactive. Extremities have no cyanosis or edema, full range of motion is present. Skin is warm and dry without rash. Neurologic: He is anxious but does follow commands, cranial nerves are intact, there are no motor or sensory deficits.  ED Course  Procedures (including critical care time) INTUBATION Performed by: [facility]  Required items: required blood products, implants, devices, and special equipment available Patient identity confirmed: provided demographic data and hospital-assigned identification number Time out: Immediately prior to  procedure a "time out" was called to verify the correct patient, procedure, equipment, support staff and site/side marked as required.  Indications: Respiratory distress with hypoxia   Intubation method: Direct Macintosh blade Laryngoscopy   Preoxygenation: BVM  Sedatives: Etomidate Paralytic: Succinylcholine  Tube Size: 7.5 cuffed  Post-procedure assessment: chest rise and ETCO2 monitor Breath sounds: equal and absent over the epigastrium Tube secured with: ETT holder Chest x-ray interpreted by radiologist and me.  Chest x-ray findings: endotracheal tube in appropriate position  Patient tolerated the procedure well with no immediate complications.    Labs Review Results for orders placed during the hospital encounter of 12/30/12  CBC WITH DIFFERENTIAL      Result Value Range   WBC 10.3  4.0 - 10.5 K/uL   RBC 4.66  4.22 - 5.81 MIL/uL   Hemoglobin 13.1  13.0 - 17.0 g/dL   HCT 16.1 (*) 09.6 - 04.5 %   MCV 82.2  78.0 - 100.0 fL   MCH 28.1  26.0 - 34.0 pg   MCHC 34.2  30.0 - 36.0 g/dL   RDW 40.9  81.1 - 91.4 %   Platelets 196  150 - 400 K/uL   Neutrophils Relative % 23 (*) 43 - 77 %   Neutro Abs 2.3  1.7 - 7.7 K/uL   Lymphocytes Relative 58 (*) 12 - 46 %   Lymphs Abs 5.9 (*) 0.7 - 4.0 K/uL   Monocytes Relative 9  3 - 12 %   Monocytes Absolute 0.9  0.1 - 1.0 K/uL   Eosinophils Relative 11 (*) 0 - 5 %   Eosinophils Absolute 1.1 (*) 0.0 - 0.7 K/uL   Basophils Relative 0  0 - 1 %   Basophils Absolute 0.0  0.0 - 0.1 K/uL  COMPREHENSIVE METABOLIC PANEL      Result Value Range   Sodium 140  135 - 145 mEq/L   Potassium 4.7  3.5 - 5.1 mEq/L   Chloride 103  96 - 112 mEq/L   CO2 26  19 - 32 mEq/L   Glucose, Bld 153 (*) 70 - 99 mg/dL   BUN 24 (*) 6 - 23 mg/dL   Creatinine, Ser 7.82 (*) 0.50 - 1.35 mg/dL   Calcium 9.2  8.4 - 95.6 mg/dL   Total Protein 7.1  6.0 - 8.3 g/dL   Albumin 3.2 (*) 3.5 - 5.2 g/dL   AST 35  0 - 37 U/L   ALT 28  0 - 53 U/L   Alkaline Phosphatase 73   39 - 117 U/L   Total Bilirubin 0.4  0.3 - 1.2 mg/dL   GFR calc non Af Amer 45 (*) >90 mL/min   GFR calc Af Amer 52 (*) >90 mL/min  TROPONIN I      Result Value Range   Troponin I <0.30  <0.30 ng/mL  LACTIC ACID, PLASMA      Result Value Range   Lactic Acid, Venous 2.5 (*) 0.5 -  2.2 mmol/L  CG4 I-STAT (LACTIC ACID)      Result Value Range   Lactic Acid, Venous 2.73 (*) 0.5 - 2.2 mmol/L  POCT I-STAT TROPONIN I      Result Value Range   Troponin i, poc 0.00  0.00 - 0.08 ng/mL   Comment 3           POCT I-STAT 3, BLOOD GAS (G3+)      Result Value Range   pH, Arterial 7.156 (*) 7.350 - 7.450   pCO2 arterial 74.4 (*) 35.0 - 45.0 mmHg   pO2, Arterial 142.0 (*) 80.0 - 100.0 mmHg   Bicarbonate 26.3 (*) 20.0 - 24.0 mEq/L   TCO2 28  0 - 100 mmol/L   O2 Saturation 98.0     Acid-base deficit 4.0 (*) 0.0 - 2.0 mmol/L   Patient temperature 98.6 F     Collection site RADIAL, ALLEN'S TEST ACCEPTABLE     Drawn by RT     Sample type ARTERIAL     Comment NOTIFIED PHYSICIAN     Imaging Review Dg Chest Portable 1 View  12/30/2012   CLINICAL DATA:  Shortness of breath, status post intubation.  EXAM: PORTABLE CHEST - 1 VIEW  COMPARISON:  Chest radiograph September 11, 2012  FINDINGS: Endotracheal tube tip projects 5.6 cm above the carina. Nasogastric tube tip not imaged though, at least past the distal esophagus.  New 2.6 cm rounded density in right hilum. Cardiac silhouette is unremarkable. Mild increased central pulmonary vasculature congestion without pleural effusions, left costophrenic angle incompletely imaged. No pneumothorax. Soft tissue planes and included osseous structures are nonsuspicious.  IMPRESSION: Endotracheal tube tip projects 5.6 cm above the carina. Nasogastric tube tip not imaged though, at least past the distal esophagus.  Mild central pulmonary vasculature congestion with new 2.5 cm rounded nodular density projecting right hilum, though this could reflect a pulmonary vessel en face,  lymph node/mass could have a similar appearance. Recommend followup radiographs after treatment to verify improvement.   Electronically Signed   By: Awilda Metro   On: 12/30/2012 01:58   Images viewed by me.  EKG Interpretation    Date/Time:  Wednesday December 30 2012 01:37:08 EST Ventricular Rate:  118 PR Interval:  165 QRS Duration: 102 QT Interval:  329 QTC Calculation: 461 R Axis:   77 Text Interpretation:  Sinus tachycardia Atrial premature complex Probable left atrial enlargement Abnormal R-wave progression, early transition When compared with ECG of 03/08/2010, No significant change was found Confirmed by [facility]  MD, Hanna ([id number]) on 12/30/2012 3:01:29 AM           CRITICAL CARE Performed by: VWUJW,[email or web address] Total critical care time: 55 minutes Critical care time was exclusive of separately billable procedures and treating other patients. Critical care was necessary to treat or prevent imminent or life-threatening deterioration. Critical care was time spent personally by me on the following activities: development of treatment plan with patient and/or surrogate as well as nursing, discussions with consultants, evaluation of patient's response to treatment, examination of patient, obtaining history from patient or surrogate, ordering and performing treatments and interventions, ordering and review of laboratory studies, ordering and review of radiographic studies, pulse oximetry and re-evaluation of patient's condition.  MDM   1. Acute respiratory failure   2. Acute encephalopathy   3. AKI (acute kidney injury)   4. COPD exacerbation    Acute respiratory distress with hypoxia. With his history of having been intubated and with severe hypoxia in spite of  albuterol and ipratropium and supplemental oxygen, it was felt that he needed to be intubated immediately. Intubation was done and oxygen saturations at come up to 94%. He will need to be admitted to the ICU. Old records are  reviewed and his last hospital admission was in January of 2012 at which time he was intubated with severe hypercarbic respiratory failure.  Saturation is 200%. ABG shows respiratory acidosis with PCO2 of 74. Case has been discussed with Dr. Craige Cotta of pulmonary critical care who agrees to admit the patient to ICU. Family is present a been informed of patient's need for intubation. He is currently sedated on a to prevent drip.  Dione Booze, MD 12/30/12 [phone]

## 2012-12-30 NOTE — Procedures (Signed)
Extubation Procedure Note  Patient Details:   Name: MALEKO GREULICH DOB: 03-12-1950 MRN: [id number]   Pt extubated to 4L [place] per MD order, VS WNL, pt able to vocalize, no stridor noted. Pt is tolerating well at this time, RT will continue to monitor.    Evaluation  O2 sats: stable throughout Complications: No apparent complications Patient did tolerate procedure well. Bilateral Breath Sounds: Diminished Suctioning: Airway Yes  Harley Hallmark 12/30/2012, 12:11 PM

## 2012-12-30 NOTE — Progress Notes (Signed)
General update/progress note:  S:  No acute events since admission.  Pt remains on vent, but is awake and following commands.  Nodding his head yes when asked if he feels any better.  O: Vitals stable. Gen: Resting in bed, on vent. Neuro:  RASS 0.  Follows commands. Cardio:  RRR, no M/R/G. Resp:  Resps even and unlabored. CTAB, no W/R/R. GI:  BS x 4.  Soft, NT/ND. MSK:  No gross deformities.  No edema. Skin:  Warm and dry.  CXR: ETT in good position, no acute process.  Nodule seen in initial CXR not evident on post intubation film  A/P:  Acute Respiratory Failure COPD - Repeat CBC. - SBT. - Cont BD's and solumedrol. - Cont levaquin - f/u PCT. - f/u cultures.  AKI - Repeat BMP today. - Con't fluids for now.  Rutherford Guys, [place] - S   Attending:  I have seen and assessed the patient with R. Celine Mans. I agree with the data and plans as amended above. He is no longer bronchospastic, is tolerating PSv well. i believe we can extubate, will proceed and follow him closely.   Additional CC time 45 minutes  Levy Pupa, MD, PhD 12/30/2012, 11:47 AM [facility] [phone] or if no answer [phone]

## 2012-12-30 NOTE — ED Notes (Signed)
Pt arrives via EMS with complaint of SOB, pt thought that he is having an allergic reaction for ibuprofen.  EMs gave Solu-medrol, atrovent  And albuterol. Hr in 130s upon EMS arrival. O2 sat 80%, dropped to 68%. Pt anxious and in distress upon arrival. Pts son at bedside. Language barrier present with pt.

## 2012-12-30 NOTE — Progress Notes (Signed)
UR Completed.  Ayisha Pol Jane [phone] 12/30/2012  

## 2012-12-30 NOTE — Progress Notes (Signed)
Wasted 225 cc fentanyl bag. Witnessed by Duwayne Heck, RN

## 2012-12-30 NOTE — H&P (Signed)
PULMONARY  / CRITICAL CARE MEDICINE  Name: Nathaniel Hartman MRN: [id number] DOB: 1950/02/21    ADMISSION DATE:  12/30/2012 CONSULTATION DATE:  12/30/2012  REFERRING MD :  EDP PRIMARY SERVICE:  PCCM  CHIEF COMPLAINT:  Acute respiratory failure  BRIEF PATIENT DESCRIPTION: 62 yo with past medical history of COPD brought to ED with respiratory distress.  In ED found to be hypercarbic, intubated.  PCCM was consulted.  SIGNIFICANT EVENTS / STUDIES:  11/19  Respiratory distress, hypercarbic, intubated  LINES / TUBES: OETT 11/19 >>> OGT 11/19 >>> Foley 11/19 >>>  CULTURES:  ANTIBIOTICS: Levaquin 11/19 >>>  The patient is encephalopathic and unable to provide history, which was obtained for available medical records.  HISTORY OF PRESENT ILLNESS:  62 yo with past medical history of COPD brought to ED with respiratory distress.  In ED found to be hypercarbic, intubated.  PCCM was consulted.  PAST MEDICAL HISTORY :  Past Medical History  Diagnosis Date  . Bilateral pneumonia 07/31/2007  . Asthma   . Chronic cough   . Pansinusitis   . COPD (chronic obstructive pulmonary disease)    Past Surgical History  Procedure Laterality Date  . Left hand surgery  08/2006   Prior to Admission medications   Medication Sig Start Date End Date Taking? Authorizing Provider  albuterol (PROVENTIL HFA;VENTOLIN HFA) 108 (90 BASE) MCG/ACT inhaler Inhale 2 puffs into the lungs every 6 (six) hours as needed for wheezing. 07/20/12   Dorothea Ogle, MD  budesonide-formoterol (SYMBICORT) 160-4.5 MCG/ACT inhaler Take 2 puffs first thing in am and then another 2 puffs about 12 hours later. 09/04/12   Ripudeep Jenna Luo, MD  Famotidine (PEPCID PO) Take 1 tablet by mouth at bedtime.     Historical Provider, MD  lisinopril-hydrochlorothiazide (ZESTORETIC) 20-12.5 MG per tablet Take 2 tablets by mouth daily. 12/07/12   Jeanann Lewandowsky, MD  Omeprazole (PRILOSEC PO) Take 1 capsule by mouth daily.     Historical Provider,  MD   Allergies  Allergen Reactions  . Acetaminophen    FAMILY HISTORY:  History reviewed. No pertinent family history.  SOCIAL HISTORY:  reports that he quit smoking about 15 years ago. He does not have any smokeless tobacco history on file. He reports that he does not drink alcohol or use illicit drugs.  REVIEW OF SYSTEMS:  Unable to provide.  INTERVAL HISTORY:  VITAL SIGNS: Pulse Rate:  [119-124] 124 (11/19 0135) Resp:  [19-25] 21 (11/19 0135) BP: (192-203)/(114-124) 203/124 mmHg (11/19 0135) SpO2:  [73 %-100 %] 100 % (11/19 0135) FiO2 (%):  [50 %-100 %] 50 % (11/19 0159) Weight:  [84 kg (185 lb 3 oz)] 84 kg (185 lb 3 oz) (11/19 0122)  HEMODYNAMICS:   VENTILATOR SETTINGS: Vent Mode:  [-] PRVC FiO2 (%):  [50 %-100 %] 50 % Set Rate:  [14 bmp] 14 bmp Vt Set:  [550 mL] 550 mL PEEP:  [5 cmH20] 5 cmH20 INTAKE / OUTPUT: Intake/Output   None     PHYSICAL EXAMINATION: General:  Appears acutely ill, mechanically ventilated, synchronous Neuro:  Encephalopathic, nonfocal, cough / gag diminished HEENT:  PERRL, OETT / OGT Cardiovascular:  RRR, no m/r/g Lungs:  Bilateral diminished air entry, wheezing Abdomen:  Soft, nontender, bowel sounds diminished Musculoskeletal:  Moves all extremities, no edema Skin:  Intact  LABS:  CBC  Recent Labs Lab 12/30/12 0126  WBC 10.3  HGB 13.1  HCT 38.3*  PLT 196   Coag's No results found for this basename: APTT, INR,  in the last 168 hours BMET  Recent Labs Lab 12/30/12 0126  NA 140  K 4.7  CL 103  CO2 26  BUN 24*  CREATININE 1.57*  GLUCOSE 153*   Electrolytes  Recent Labs Lab 12/30/12 0126  CALCIUM 9.2   Sepsis Markers  Recent Labs Lab 12/30/12 0139  LATICACIDVEN 2.73*   ABG  Recent Labs Lab 12/30/12 0152  PHART 7.156*  PCO2ART 74.4*  PO2ART 142.0*   Liver Enzymes  Recent Labs Lab 12/30/12 0126  AST 35  ALT 28  ALKPHOS 73  BILITOT 0.4  ALBUMIN 3.2*   Cardiac Enzymes No results found for  this basename: TROPONINI, PROBNP,  in the last 168 hours Glucose No results found for this basename: GLUCAP,  in the last 168 hours  CXR:  11/19 >>> Hardware in good position, hyperinflation, no overt airspace disease, right hilum nodule (new)  ASSESSMENT / PLAN:  PULMONARY A:  Acute respiratory failure in setting of acute exacerbation of COPD.  New R hilar pulmonary nodule. P:   Gaol SpO2>92, pH>7.30 Full mechanical support Daily SBT Ventilator bundle Trend ABG / CXR Albuterol / Atrovent Solu-Medrol Hold Symbicort Nodule work up when stable  CARDIOVASCULAR A: HTN.  Mildly elevated lactate. P:  Goal MAP>60 Trend troponin / lactate 12-lead EKG Hold Lisinopril  RENAL A:  AKI. P:   Trend BMP NS 500 x 1 NS@100   GASTROINTESTINAL A:  No active issues. P:   NPO as intubated Protonix for GI Px  HEMATOLOGIC A:  No active issues. P:  Trend CBC Heparin for DVT Px  INFECTIOUS A:  No active issues. P:   Cultures and antibiotics as above PCT  ENDOCRINE  A:  No active issues. P:   No intervention required  NEUROLOGIC A:  Acute encephalopathy. P:   Goal RASS 0 to -1 Propofol gtt Fentanyl PRN  I have personally obtained history, examined patient, evaluated and interpreted laboratory and imaging results, reviewed medical records, formulated assessment / plan and placed orders.  CRITICAL CARE:  The patient is critically ill with multiple organ systems failure and requires high complexity decision making for assessment and support, frequent evaluation and titration of therapies, application of advanced monitoring technologies and extensive interpretation of multiple databases. Critical Care Time devoted to patient care services described in this note is 45 minutes.   Lonia Farber, MD Pulmonary and Critical Care Medicine [facility] Pager: [phone]  12/30/2012, 2:10 AM

## 2012-12-31 ENCOUNTER — Inpatient Hospital Stay (HOSPITAL_COMMUNITY): Payer: Medicaid Other

## 2012-12-31 DIAGNOSIS — J9600 Acute respiratory failure, unspecified whether with hypoxia or hypercapnia: Principal | ICD-10-CM

## 2012-12-31 DIAGNOSIS — G9340 Encephalopathy, unspecified: Secondary | ICD-10-CM

## 2012-12-31 DIAGNOSIS — J441 Chronic obstructive pulmonary disease with (acute) exacerbation: Secondary | ICD-10-CM

## 2012-12-31 DIAGNOSIS — T7840XA Allergy, unspecified, initial encounter: Secondary | ICD-10-CM

## 2012-12-31 DIAGNOSIS — N179 Acute kidney failure, unspecified: Secondary | ICD-10-CM

## 2012-12-31 LAB — BASIC METABOLIC PANEL
CO2: 20 mEq/L (ref 19–32)
Calcium: 8.7 mg/dL (ref 8.4–10.5)
Chloride: 104 mEq/L (ref 96–112)
GFR calc Af Amer: 75 mL/min — ABNORMAL LOW (ref >90)
GFR calc non Af Amer: 65 mL/min — ABNORMAL LOW (ref >90)
Glucose, Bld: 125 mg/dL — ABNORMAL HIGH (ref 70–99)
Sodium: 137 mEq/L (ref 135–145)

## 2012-12-31 LAB — CBC
MCH: 27.6 pg (ref 26.0–34.0)
MCHC: 34.5 g/dL (ref 30.0–36.0)
Platelets: 189 10*3/uL (ref 150–400)
RBC: 4.35 MIL/uL (ref 4.22–5.81)
RDW: 14 % (ref 11.5–15.5)

## 2012-12-31 LAB — PROCALCITONIN: Procalcitonin: 0.38 ng/mL

## 2012-12-31 MED ORDER — PREDNISONE 10 MG PO TABS: ORAL_TABLET | ORAL | Status: DC

## 2012-12-31 NOTE — Discharge Summary (Signed)
Physician Discharge Summary  Patient ID: Nathaniel Hartman MRN: [id number] DOB/AGE: 64-May-1952 64 y.o.  Admit date: 12/30/2012 Discharge date: 12/31/2012  Discharge Diagnoses:  Active Problems:   Acute respiratory failure   AKI (acute kidney injury)   Acute encephalopathy  Brief Summary: Nathaniel Hartman is a 62 y.o. y/o male with a PMH of COPD, Asthma, Chronic cough.  EMS brought him to [facility] ED on 11/19 around 1:30AM after he began to experience SOB 2 - 3 hours after taking Ibuprofen for back pain. En route to the ED, EMS administered Solu-Medrol, Atrovent, and Albuterol.  Upon arrival to ED, pt was anxious and in respiratory distress with severe dyspnea.  SpO2 was initially 80% but then dropped to 64%.  He was subsequently intubated for acute respiratory failure and PCCM was consulted for admission.  Later that morning around 11:00AM, the patient was extubated as his work of breathing had significantly improved.  He tolerated extubation well and was able to maintain his SpO2 > 95% on room air.  He was stable throughout the day and had an uneventful night. On 11/20, patient was walking around his hospital room, pushing his IV pole with him.  He was on room air, and SpO2 was 98%.  After discussion with him and his son, the decision was made to discharge the patient home with follow up in the office early next week.  Consults:  None  Lines/tubes: ETT 11/19 >>> 11/19 OGT 11/19 >>> 11/19 Foley 11/19 >>> 11/19  Microbiology/Sepsis markers: None  Significant Diagnostic Studies:  CXR 11/19: ETT in good position.  2.5cm rounded density, likely reflecting pulmonary vessel in face. CXR 11/20:  Mild bibasilar atelectasis.  Prior density on previous image not seen.                                                                    Discharge Plan by Discharge Diagnosis  Acute Respiratory Failure - After in depth discussion with patient and his son, concern was that patient took Advil and then began  to experience symptoms of SOB.  Of note, this same pattern has occurred in the past.  We re-inforced the fact that patient should never take Advil or any other NSAID's at any time in the future, patient and son verbalized understanding. COPD Exacerbation. Chronic Cough. - Continue home Albuterol, Symbicort, and Pepcid. - Add Prednisone taper. - No role for continuing Levaquin as outpatient as there was no evidence of infective process. - NO ADVIL OR NSAIDS. - Follow up appointment with Dr. Delford Field on 01/05/13 at 11AM (see below).  HTN - Hold Lisinopril given concerns for allergic reaction.  Will need to be revisited on an outpatient basis.    Filed Vitals:   12/31/12 0800 12/31/12 0803 12/31/12 0900 12/31/12 1000  BP: 103/73  119/75 129/81  Pulse: 107  87 89  Temp:  98.1 F (36.7 C)    TempSrc:  Oral    Resp: 25  20 23   Height:      Weight:      SpO2: 91%  100% 100%     Discharge Labs  BMET  Recent Labs Lab 12/30/12 0126 12/30/12 0211 12/30/12 1230 12/31/12 0330  NA 140  --  138 137  K 4.7  --  4.7 4.5  CL 103  --  104 104  CO2 26  --  20 20  GLUCOSE 153*  --  140* 125*  BUN 24*  --  22 24*  CREATININE 1.57*  --  1.26 1.16  CALCIUM 9.2  --  8.9 8.7  MG  --  2.0  --   --   PHOS  --  4.8*  --   --      CBC  Recent Labs Lab 12/30/12 0126 12/30/12 2203 12/31/12 0330  HGB 13.1 12.5* 12.0*  HCT 38.3* 35.7* 34.8*  WBC 10.3 8.6 9.4  PLT 196 192 189     Future Appointments Provider Department Dept Phone   01/05/2013 11:00 AM Storm Frisk, MD [facility] [phone]   03/10/2013 9:45 AM [facility] Covering Provider [facility] [phone]      Follow-up Information   Follow up with Jeanann Lewandowsky, MD.   Specialty:  Internal Medicine   Contact information:   [address] [place] [address] [phone]       Follow up with Shan Levans, MD On 01/05/2013. (11:00am )    Specialty:  Pulmonary  Disease   Contact information:   [address] [place] [address] [phone]         Medication List    STOP taking these medications       lisinopril-hydrochlorothiazide 20-12.5 MG per tablet  Commonly known as:  ZESTORETIC     omeprazole 20 MG capsule  Commonly known as:  PRILOSEC      TAKE these medications       albuterol 108 (90 BASE) MCG/ACT inhaler  Commonly known as:  PROVENTIL HFA;VENTOLIN HFA  Inhale 2 puffs into the lungs every 6 (six) hours as needed for wheezing.     albuterol (2.5 MG/3ML) 0.083% nebulizer solution  Commonly known as:  PROVENTIL  Take 2.5 mg by nebulization every 4 (four) hours as needed for wheezing or shortness of breath.     budesonide-formoterol 160-4.5 MCG/ACT inhaler  Commonly known as:  SYMBICORT  Take 2 puffs first thing in am and then another 2 puffs about 12 hours later.     PEPCID PO  Take 1 tablet by mouth at bedtime.     predniSONE 10 MG tablet  Commonly known as:  DELTASONE  4 tabs po daily x 3 days then 3 tabs po daily x 3 days then 2 tabs po daily x 3 days then 1 tab po daily x 3 days then STOP        Disposition: 01-Home or Self Care  Discharged Condition: Nathaniel Hartman has met maximum benefit of inpatient care and is medically stable and cleared for discharge.  Patient is pending follow up as above.      Time spent on disposition:  Greater than 35 minutes.   Signed: Rutherford Guys, PA - S   *Care during the described time interval was provided by me and/or other providers on the critical care team. I have reviewed this patient's available data, including medical history, events of note, physical examination and test results as part of my evaluation.      Levy Pupa, MD, PhD 12/31/2012, 1:56 PM [facility] [phone] or if no answer [phone]

## 2012-12-31 NOTE — Progress Notes (Signed)
Speech Language Pathology  Patient Details Name: Nathaniel Hartman MRN: [id number] DOB: 11-28-1950 Today's Date: 12/31/2012 Time:  -    Bedside swallow ordered after intubation.  Pt. Sitting up in bed, finished breakfast. RN and pt. report no difficulty swallowing.  He was intubated less than one day.  MD agreed BSE was not necessary for this pt. And will discontinue order per Dr. Delton Coombes.  Breck Coons [place] [employer] Pager [phone]  Breck Coons [place].Ed [employer] [phone]  12/31/2012

## 2013-01-05 ENCOUNTER — Encounter: Payer: Self-pay | Admitting: Critical Care Medicine

## 2013-01-05 ENCOUNTER — Ambulatory Visit (INDEPENDENT_AMBULATORY_CARE_PROVIDER_SITE_OTHER): Payer: No Typology Code available for payment source | Admitting: Critical Care Medicine

## 2013-01-05 VITALS — BP 124/94 | HR 73 | Temp 97.6°F | Ht 74.0 in | Wt 179.6 lb

## 2013-01-05 DIAGNOSIS — J9600 Acute respiratory failure, unspecified whether with hypoxia or hypercapnia: Secondary | ICD-10-CM

## 2013-01-05 DIAGNOSIS — J441 Chronic obstructive pulmonary disease with (acute) exacerbation: Secondary | ICD-10-CM

## 2013-01-05 DIAGNOSIS — I10 Essential (primary) hypertension: Secondary | ICD-10-CM

## 2013-01-05 DIAGNOSIS — N179 Acute kidney failure, unspecified: Secondary | ICD-10-CM

## 2013-01-05 MED ORDER — LOSARTAN POTASSIUM 50 MG PO TABS: 50.0000 mg | ORAL_TABLET | Freq: Every day | ORAL | Status: DC

## 2013-01-05 NOTE — Progress Notes (Deleted)
Subjective:    Patient ID: Nathaniel Hartman, male    DOB: March 29, 1950, 62 y.o.   MRN: [id number]  HPI Subjective:     Patient ID: Nathaniel Hartman, male   DOB: 1950-05-21, 62 y.o.   MRN: [id number]  HPI  62 yo [place] male quit smoking 1999 with AB adm 10/22/2007 acute resp failure extubated on 9/11 and discharged 9/14 on symbicort and prednisone taper. Spirometry and walking sats normal after this episode c/w asthma, not copd.  January 10, 2010 ov cough much better not using proair, excellent technique with symbicort, on 5 mg per day. rec Try Prednisone 10 mg one half odd days only, take none on even days.  Remember to keep proaire handy for as needed use   1/26-30/2012 admit for ? aecopd in setting of severe N and V, back to baseline p discharge    March 20, 2010 ov f/u asthma on pred 10 mg one half on even, no change on days he takes vs not taking. rec q 3d pred   April 25, 2010 ov no cough no sob, no need prns. no dicolored sputum.  rec ) Ok to try prednisone every 3rd day and stop at end of month if doing well   09/13/2010 f/u ov/Wert cc increase cough mostly day, not bothering him once asleep. Minimal use of saba. No purulent sputum. recPrednisone 10 mg take  4 each am x 2 days,   2 each am x 2 days,  1 each am x2days and stop  GERD   Diet   10/24/2010 f/u ov/Wert cc worse cough  Indolent onset, slt progressive since 2 weeks from last ov(p prednsione rx)> slt yellow mucus. >>Augmentin x 10 days   11/14/2010 Follow up and Med review  Pt returns for follow up and med review. Since last OV he is doing well. Has occasional cough. On increased SABA use . No ER /Hospital visit or nocturnal awakenings.  We reviewed his med list and updated his med calendar w/ pt education  rec Follow med calendar closely and bring to each visit.  Flu shot today   02/01/2011 f/u ov/Wert did not bring the med calendar out of symbicort x one month cc increase sob > needing  proaire a lot more than he  was esp in afternoons, evenings, most of his problems though are related to nasal drainage, watery, not purulent. rec If there is a less expensive alternative to symbicort on the insurance company plan (called formulary) please let me know Only use your albuterol (proaire)  09/2012 Wert cough is dry, day >> night,  No sob. Very confusing picture re whether he takes his meds consistently or not, but apparently when he has access to meds he does fine   Sleeping ok without nocturnal  or early am exacerbation  of respiratory  c/o's or need for noct saba. Also denies any obvious fluctuation of symptoms with weather or environmental changes or other aggravating or alleviating factors except as outlined above   01/05/2013 Chief Complaint  Patient presents with  . Post [facility]spital Follow up    Breathing has improved.  No SOB, wheezing, chest tightness/pain, or cough.  Post hosp f/u. Adm 11/19>>11/20 . Pt intubated and extubated next day.   Now is better.  ?NSAID allergy ?ACE I issues Now on pred taper. No cough now,   ROS  At present neg for  any significant sore throat, dysphagia, fever, chills, sweats, unintended wt loss, pleuritic or exertional cp, hempoptysis, orthopnea  pnd or leg swelling.  Also denies presyncope, palpitations, heartburn, abdominal pain, nausea, vomiting, diarrhea  or change in bowel or urinary habits, dysuria,hematuria,  rash, arthralgias, visual complaints, headache, numbness weakness or ataxia.        Past Medical History:  1) pneumonia bilateral, diagnosed on July 31, 2007.  2) CT negative for PE - June 2009  3) VDRF -sept 2009. Admitted for 4 days. Intubated for 1 day  4) HIV Negative - sept 2009 at [facility]  ASTHMA -  - HFA 100% effective tecnique September 27, 2009 > 75% October 31, 2009 > 90% November 29, 2009  Chronic cough ? etiology  - no better on qvar and singulair both stopped September 13, 2009 > no worse off September 27, 2009  - Sinus CT 09/14/09 > pansinusitis  - rx  21 days rx started c augment October 25, 2009 >> repeat sinus ct:neg 11/15/09  - Daily prednisone rx October 31, 2009 >>> try qod dosing January 10, 2010 > tapered off completely 05/2010 Health Maintenance  - Pneumovax given October 31, 2009  COMPLEX MED REGIMEN-Meds reviewed with pt education and computerized med calendar completed 11/14/09 , 11/14/2010           Objective:   Physical Exam amb vietnamese male limited English, nad  wt 175 September 27, 2009 > 186 April 27, 2010 >    >>170 11/14/2010 >  170 02/01/2011 > 01/05/2013  173 HEENT: [place]/AT, , EACs-clear, TMs-wnl, NOSE-pale , THROAT-clear, no exudate  NECK: Supple w/ fair ROM; no JVD; normal carotid impulses w/o bruits; no thyromegaly or nodules palpated; no lymphadenopathy.  RESP clear to A and P  CARD: RRR, no m/r/g  GI: Soft & nt; nml bowel sounds; no organomegaly or masses detected.  Musco: Warm bil, no calf tenderness edema, clubbing, pulses int   cxr 09/11/12 Right middle lobe pneumonia has almost cleared with slight residual  atelectasis seen on the lateral view.     Assessment:             Review of Systems     Objective:   Physical Exam        Assessment & Plan:

## 2013-01-05 NOTE — Patient Instructions (Signed)
Stop prinivil/hctz Start Losartan 50mg  daily, Rx printed take to health department Stay on symbicort  Finish Prednisone NO Ibuprofen/Aleve/Motrin Return 3 months Dr Sherene Sires

## 2013-01-06 NOTE — Assessment & Plan Note (Signed)
Question of reaction to ACE inhibitor with upper airway obstruction resulting in acute respiratory failure now resolved Because of elevated blood pressure the patient will be started on losartan daily in place of Prinzide

## 2013-01-06 NOTE — Progress Notes (Signed)
Subjective:    Patient ID: Nathaniel Hartman, male    DOB: Dec 29, 1950, 62 y.o.   MRN: [id number]  HPI 62 yo [place] male quit smoking 1999 with AB adm 10/22/2007 acute resp failure extubated on 9/11 and discharged 9/14 on symbicort and prednisone taper. Spirometry and walking sats normal after this episode c/w asthma, not copd.  January 10, 2010 ov cough much better not using proair, excellent technique with symbicort, on 5 mg per day. rec Try Prednisone 10 mg one half odd days only, take none on even days.  Remember to keep proaire handy for as needed use   1/26-30/2012 admit for ? aecopd in setting of severe N and V, back to baseline p discharge    March 20, 2010 ov f/u asthma on pred 10 mg one half on even, no change on days he takes vs not taking. rec q 3d pred   April 25, 2010 ov no cough no sob, no need prns. no dicolored sputum.  rec ) Ok to try prednisone every 3rd day and stop at end of month if doing well   09/13/2010 f/u ov/Wert cc increase cough mostly day, not bothering him once asleep. Minimal use of saba. No purulent sputum. recPrednisone 10 mg take  4 each am x 2 days,   2 each am x 2 days,  1 each am x2days and stop  GERD   Diet   10/24/2010 f/u ov/Wert cc worse cough  Indolent onset, slt progressive since 2 weeks from last ov(p prednsione rx)> slt yellow mucus. >>Augmentin x 10 days   11/14/2010 Follow up and Med review  Pt returns for follow up and med review. Since last OV he is doing well. Has occasional cough. On increased SABA use . No ER /Hospital visit or nocturnal awakenings.  We reviewed his med list and updated his med calendar w/ pt education  rec Follow med calendar closely and bring to each visit.  Flu shot today   02/01/2011 f/u ov/Wert did not bring the med calendar out of symbicort x one month cc increase sob > needing  proaire a lot more than he was esp in afternoons, evenings, most of his problems though are related to nasal drainage, watery, not  purulent. rec If there is a less expensive alternative to symbicort on the insurance company plan (called formulary) please let me know Only use your albuterol (proaire)  09/2012 Wert cough is dry, day >> night,  No sob. Very confusing picture re whether he takes his meds consistently or not, but apparently when he has access to meds he does fine   Sleeping ok without nocturnal  or early am exacerbation  of respiratory  c/o's or need for noct saba. Also denies any obvious fluctuation of symptoms with weather or environmental changes or other aggravating or alleviating factors except as outlined above   01/05/2013 Chief Complaint  Patient presents with  . Post Hopspital Follow up    Breathing has improved.  No SOB, wheezing, chest tightness/pain, or cough.  Post hosp f/u. Adm 11/19>>11/20 . Pt intubated and extubated next day.   Now is better.  ?NSAID allergy ?ACE I issues Now on pred taper. No cough now,   ROS  At present neg for  any significant sore throat, dysphagia, fever, chills, sweats, unintended wt loss, pleuritic or exertional cp, hempoptysis, orthopnea pnd or leg swelling.  Also denies presyncope, palpitations, heartburn, abdominal pain, nausea, vomiting, diarrhea  or change in bowel or urinary habits,  dysuria,hematuria,  rash, arthralgias, visual complaints, headache, numbness weakness or ataxia.        Past Medical History:  1) pneumonia bilateral, diagnosed on July 31, 2007.  2) CT negative for PE - June 2009  3) VDRF -sept 2009. Admitted for 4 days. Intubated for 1 day  4) HIV Negative - sept 2009 at [facility]  ASTHMA -  - HFA 100% effective tecnique September 27, 2009 > 75% October 31, 2009 > 90% November 29, 2009  Chronic cough ? etiology  - no better on qvar and singulair both stopped September 13, 2009 > no worse off September 27, 2009  - Sinus CT 09/14/09 > pansinusitis  - rx 21 days rx started c augment October 25, 2009 >> repeat sinus ct:neg 11/15/09  - Daily prednisone rx  October 31, 2009 >>> try qod dosing January 10, 2010 > tapered off completely 05/2010 Health Maintenance  - Pneumovax given October 31, 2009  COMPLEX MED REGIMEN-Meds reviewed with pt education and computerized med calendar completed 11/14/09 , 11/14/2010       Review of Systems Constitutional:   No  weight loss, night sweats,  Fevers, chills, fatigue, lassitude. HEENT:   No headaches,  Difficulty swallowing,  Tooth/dental problems,  Sore throat,                No sneezing, itching, ear ache, nasal congestion, post nasal drip,   CV:  No chest pain,  Orthopnea, PND, swelling in lower extremities, anasarca, dizziness, palpitations  GI  No heartburn, indigestion, abdominal pain, nausea, vomiting, diarrhea, change in bowel habits, loss of appetite  Resp: No shortness of breath with exertion or at rest.  No excess mucus, no productive cough,  No non-productive cough,  No coughing up of blood.  No change in color of mucus.  No wheezing.  No chest wall deformity  Skin: no rash or lesions.  GU: no dysuria, change in color of urine, no urgency or frequency.  No flank pain.  MS:  No joint pain or swelling.  No decreased range of motion.  No back pain.  Psych:  No change in mood or affect. No depression or anxiety.  No memory loss.     Objective:   Physical Exam Filed Vitals:   01/05/13 1028  BP: 124/94  Pulse: 73  Temp: 97.6 F (36.4 C)  TempSrc: Oral  Height: 6\' 2"  (1.88 m)  Weight: 179 lb 9.6 oz (81.466 kg)  SpO2: 100%    Gen: Pleasant, well-nourished, in no distress,  normal affect  ENT: No lesions,  mouth clear,  oropharynx clear, no postnasal drip  Neck: No JVD, no TMG, no carotid bruits  Lungs: No use of accessory muscles, no dullness to percussion, clear without rales or rhonchi  Cardiovascular: RRR, heart sounds normal, no murmur or gallops, no peripheral edema  Abdomen: soft and NT, no HSM,  BS normal  Musculoskeletal: No deformities, no cyanosis or  clubbing  Neuro: alert, non focal  Skin: Warm, no lesions or rashes  No results found.        Assessment & Plan:   COPD exacerbation History of chronic obstructive lung disease smoking induced with associated significant reversible component with asthmatic bronchitis. Recent upper airway irritability associated with ACE inhibitor use and potential allergic reaction from nonsteroidal anti-inflammatories resulting in overnight respiratory failure with immediate reversal within 24 hours with associated extubation. This patient demonstrates remarkable reversibility to the patient's lower airway obstruction. Currently the patient is stable at this time  and is instructed to avoid further nonsteroidal anti-inflammatories.  Plan Stop prinivil/hctz Start Losartan 50mg  daily, Rx printed take to health department Stay on symbicort  Finish Prednisone NO Ibuprofen/Aleve/Motrin Return 3 months Dr Sherene Sires   Acute respiratory failure Acute respiratory failure and has resolved remarkably with overnight treatment in the hospital now the patient has stable airway function this time. Exacerbation was felt in part due to ACE inhibitor use and nonsteroidal anti-inflammatory hypersensitivity  AKI (acute kidney injury) Acute renal injury was transient and now the patient's baseline creatinine is normal at this time   Essential hypertension, benign Question of reaction to ACE inhibitor with upper airway obstruction resulting in acute respiratory failure now resolved Because of elevated blood pressure the patient will be started on losartan daily in place of Prinzide   Updated Medication List Outpatient Encounter Prescriptions as of 01/05/2013  Medication Sig  . albuterol (PROVENTIL HFA;VENTOLIN HFA) 108 (90 BASE) MCG/ACT inhaler Inhale 2 puffs into the lungs every 6 (six) hours as needed for wheezing.  Marland Kitchen albuterol (PROVENTIL) (2.5 MG/3ML) 0.083% nebulizer solution Take 2.5 mg by nebulization every 4  (four) hours as needed for wheezing or shortness of breath.  . budesonide-formoterol (SYMBICORT) 160-4.5 MCG/ACT inhaler Take 2 puffs first thing in am and then another 2 puffs about 12 hours later.  . Famotidine (PEPCID PO) Take 1 tablet by mouth at bedtime.   . predniSONE (DELTASONE) 10 MG tablet 4 tabs po daily x 3 days then 3 tabs po daily x 3 days then 2 tabs po daily x 3 days then 1 tab po daily x 3 days then STOP  . losartan (COZAAR) 50 MG tablet Take 1 tablet (50 mg total) by mouth daily.

## 2013-01-06 NOTE — Assessment & Plan Note (Signed)
History of chronic obstructive lung disease smoking induced with associated significant reversible component with asthmatic bronchitis. Recent upper airway irritability associated with ACE inhibitor use and potential allergic reaction from nonsteroidal anti-inflammatories resulting in overnight respiratory failure with immediate reversal within 24 hours with associated extubation. This patient demonstrates remarkable reversibility to the patient's lower airway obstruction. Currently the patient is stable at this time and is instructed to avoid further nonsteroidal anti-inflammatories.  Plan Stop prinivil/hctz Start Losartan 50mg  daily, Rx printed take to health department Stay on symbicort  Finish Prednisone NO Ibuprofen/Aleve/Motrin Return 3 months Dr Sherene Sires

## 2013-01-06 NOTE — Assessment & Plan Note (Signed)
Acute respiratory failure and has resolved remarkably with overnight treatment in the hospital now the patient has stable airway function this time. Exacerbation was felt in part due to ACE inhibitor use and nonsteroidal anti-inflammatory hypersensitivity

## 2013-01-06 NOTE — Assessment & Plan Note (Signed)
Acute renal injury was transient and now the patient's baseline creatinine is normal at this time

## 2013-03-10 ENCOUNTER — Ambulatory Visit: Payer: Self-pay | Admitting: Internal Medicine

## 2013-04-28 ENCOUNTER — Ambulatory Visit: Payer: No Typology Code available for payment source | Attending: Internal Medicine | Admitting: Internal Medicine

## 2013-04-28 ENCOUNTER — Encounter: Payer: Self-pay | Admitting: Internal Medicine

## 2013-04-28 VITALS — BP 106/72 | HR 60 | Temp 97.8°F | Resp 19 | Wt 188.0 lb

## 2013-04-28 DIAGNOSIS — R0981 Nasal congestion: Secondary | ICD-10-CM

## 2013-04-28 DIAGNOSIS — R059 Cough, unspecified: Secondary | ICD-10-CM

## 2013-04-28 DIAGNOSIS — K219 Gastro-esophageal reflux disease without esophagitis: Secondary | ICD-10-CM | POA: Insufficient documentation

## 2013-04-28 DIAGNOSIS — R05 Cough: Secondary | ICD-10-CM | POA: Insufficient documentation

## 2013-04-28 DIAGNOSIS — J4489 Other specified chronic obstructive pulmonary disease: Secondary | ICD-10-CM | POA: Insufficient documentation

## 2013-04-28 DIAGNOSIS — J069 Acute upper respiratory infection, unspecified: Secondary | ICD-10-CM

## 2013-04-28 DIAGNOSIS — J449 Chronic obstructive pulmonary disease, unspecified: Secondary | ICD-10-CM | POA: Insufficient documentation

## 2013-04-28 DIAGNOSIS — N189 Chronic kidney disease, unspecified: Secondary | ICD-10-CM | POA: Insufficient documentation

## 2013-04-28 DIAGNOSIS — Z79899 Other long term (current) drug therapy: Secondary | ICD-10-CM | POA: Insufficient documentation

## 2013-04-28 DIAGNOSIS — J3489 Other specified disorders of nose and nasal sinuses: Secondary | ICD-10-CM

## 2013-04-28 DIAGNOSIS — I129 Hypertensive chronic kidney disease with stage 1 through stage 4 chronic kidney disease, or unspecified chronic kidney disease: Secondary | ICD-10-CM | POA: Insufficient documentation

## 2013-04-28 MED ORDER — FLUTICASONE PROPIONATE 50 MCG/ACT NA SUSP: 2.0000 | Freq: Every day | NASAL | Status: DC

## 2013-04-28 MED ORDER — BENZONATATE 100 MG PO CAPS: 100.0000 mg | ORAL_CAPSULE | Freq: Three times a day (TID) | ORAL | Status: DC | PRN

## 2013-04-28 MED ORDER — AZITHROMYCIN 250 MG PO TABS: ORAL_TABLET | ORAL | Status: DC

## 2013-04-28 NOTE — Progress Notes (Signed)
Patient complains of cough and increased phlegm for over a week now

## 2013-04-28 NOTE — Progress Notes (Signed)
MRN: [id number] Name: Nathaniel Hartman  Sex: male Age: 63 y.o. DOB: Dec 20, 1950  Allergies: Motrin and Nsaids  Chief Complaint  Patient presents with  . Cough    HPI: Patient is 63 y.o. male who has history of COPD comes today reported to have lot of nasal congestion postnasal drip productive cough for the last one week, denies any chest pain or shortness of breath has been using his inhaler, patient does not smoke cigarettes.   Past Medical History  Diagnosis Date  . Bilateral pneumonia 07/31/2007  . Asthma   . Chronic cough   . Pansinusitis   . COPD (chronic obstructive pulmonary disease)   . Hypertension   . Shortness of breath   . Chronic kidney disease   . GERD (gastroesophageal reflux disease)     Past Surgical History  Procedure Laterality Date  . Left hand surgery  08/2006      Medication List       This list is accurate as of: 04/28/13  5:43 PM.  Always use your most recent med list.               albuterol 108 (90 BASE) MCG/ACT inhaler  Commonly known as:  PROVENTIL HFA;VENTOLIN HFA  Inhale 2 puffs into the lungs every 6 (six) hours as needed for wheezing.     albuterol (2.5 MG/3ML) 0.083% nebulizer solution  Commonly known as:  PROVENTIL  Take 2.5 mg by nebulization every 4 (four) hours as needed for wheezing or shortness of breath.     azithromycin 250 MG tablet  Commonly known as:  ZITHROMAX Z-PAK  Take as directed     benzonatate 100 MG capsule  Commonly known as:  TESSALON  Take 1 capsule (100 mg total) by mouth 3 (three) times daily as needed for cough.     budesonide-formoterol 160-4.5 MCG/ACT inhaler  Commonly known as:  SYMBICORT  Take 2 puffs first thing in am and then another 2 puffs about 12 hours later.     fluticasone 50 MCG/ACT nasal spray  Commonly known as:  FLONASE  Place 2 sprays into both nostrils daily.     losartan 50 MG tablet  Commonly known as:  COZAAR  Take 1 tablet (50 mg total) by mouth daily.     PEPCID PO    Take 1 tablet by mouth at bedtime.     predniSONE 10 MG tablet  Commonly known as:  DELTASONE  4 tabs po daily x 3 days then 3 tabs po daily x 3 days then 2 tabs po daily x 3 days then 1 tab po daily x 3 days then STOP        Meds ordered this encounter  Medications  . benzonatate (TESSALON) 100 MG capsule    Sig: Take 1 capsule (100 mg total) by mouth 3 (three) times daily as needed for cough.    Dispense:  30 capsule    Refill:  1  . fluticasone (FLONASE) 50 MCG/ACT nasal spray    Sig: Place 2 sprays into both nostrils daily.    Dispense:  16 g    Refill:  2  . azithromycin (ZITHROMAX Z-PAK) 250 MG tablet    Sig: Take as directed    Dispense:  6 each    Refill:  0    Immunization History  Administered Date(s) Administered  . Influenza Split 11/14/2010  . Influenza Whole 11/06/2007, 11/08/2008, 10/31/2009, 11/12/2011  . Influenza,inj,Quad PF,36+ Mos 12/07/2012  . Pneumococcal Polysaccharide-23  10/31/2009, 12/31/2012    History reviewed. No pertinent family history.  History  Substance Use Topics  . Smoking status: Former Smoker -- 0.50 packs/day for 4 years    Quit date: 02/11/1997  . Smokeless tobacco: Not on file  . Alcohol Use: No    Review of Systems   As noted in HPI  Filed Vitals:   04/28/13 1722  BP: 106/72  Pulse: 60  Temp: 97.8 F (36.6 C)  Resp: 19    Physical Exam  Physical Exam  Constitutional: No distress.  HENT:  Nasal congestion minimal sinus tenderness, minimal pharyngeal erythema no exudate   Eyes: EOM are normal. Pupils are equal, round, and reactive to light.  Cardiovascular: Normal rate and regular rhythm.   Pulmonary/Chest: Breath sounds normal. No respiratory distress. He has no wheezes. He has no rales.    CBC    Component Value Date/Time   WBC 9.4 12/31/2012 0330   RBC 4.35 12/31/2012 0330   HGB 12.0* 12/31/2012 0330   HCT 34.8* 12/31/2012 0330   PLT 189 12/31/2012 0330   MCV 80.0 12/31/2012 0330   LYMPHSABS 5.9*  12/30/2012 0126   MONOABS 0.9 12/30/2012 0126   EOSABS 1.1* 12/30/2012 0126   BASOSABS 0.0 12/30/2012 0126    CMP     Component Value Date/Time   NA 137 12/31/2012 0330   K 4.5 12/31/2012 0330   CL 104 12/31/2012 0330   CO2 20 12/31/2012 0330   GLUCOSE 125* 12/31/2012 0330   BUN 24* 12/31/2012 0330   CREATININE 1.16 12/31/2012 0330   CREATININE 1.16 10/05/2012 0941   CALCIUM 8.7 12/31/2012 0330   PROT 7.1 12/30/2012 0126   ALBUMIN 3.2* 12/30/2012 0126   AST 35 12/30/2012 0126   ALT 28 12/30/2012 0126   ALKPHOS 73 12/30/2012 0126   BILITOT 0.4 12/30/2012 0126   GFRNONAA 65* 12/31/2012 0330   GFRAA 75* 12/31/2012 0330    Lab Results  Component Value Date/Time   CHOL 178 08/21/2012 12:25 PM    No components found with this basename: hga1c    Lab Results  Component Value Date/Time   AST 35 12/30/2012  1:26 AM    Assessment and Plan  URI (upper respiratory infection) - Plan: azithromycin (ZITHROMAX Z-PAK) 250 MG tablet  Nasal congestion - Plan: fluticasone (FLONASE) 50 MCG/ACT nasal spray Advised patient for saltwater gargles  Cough - Plan: benzonatate (TESSALON) 100 MG capsule  Return if symptoms worsen or fail to improve.  Doris CheadleADVANI, Shantele Reller, MD

## 2013-08-18 ENCOUNTER — Ambulatory Visit: Payer: No Typology Code available for payment source | Attending: Internal Medicine | Admitting: Internal Medicine

## 2013-08-18 ENCOUNTER — Encounter: Payer: Self-pay | Admitting: Internal Medicine

## 2013-08-18 VITALS — BP 103/66 | HR 81 | Temp 97.7°F | Resp 16 | Wt 181.2 lb

## 2013-08-18 DIAGNOSIS — Z87891 Personal history of nicotine dependence: Secondary | ICD-10-CM | POA: Insufficient documentation

## 2013-08-18 DIAGNOSIS — J449 Chronic obstructive pulmonary disease, unspecified: Secondary | ICD-10-CM

## 2013-08-18 DIAGNOSIS — I10 Essential (primary) hypertension: Secondary | ICD-10-CM

## 2013-08-18 DIAGNOSIS — J4489 Other specified chronic obstructive pulmonary disease: Secondary | ICD-10-CM

## 2013-08-18 DIAGNOSIS — K219 Gastro-esophageal reflux disease without esophagitis: Secondary | ICD-10-CM | POA: Insufficient documentation

## 2013-08-18 MED ORDER — ALBUTEROL SULFATE HFA 108 (90 BASE) MCG/ACT IN AERS: 2.0000 | INHALATION_SPRAY | Freq: Four times a day (QID) | RESPIRATORY_TRACT | Status: DC | PRN

## 2013-08-18 MED ORDER — BUDESONIDE-FORMOTEROL FUMARATE 160-4.5 MCG/ACT IN AERO: INHALATION_SPRAY | RESPIRATORY_TRACT | Status: DC

## 2013-08-18 MED ORDER — LOSARTAN POTASSIUM 50 MG PO TABS: 50.0000 mg | ORAL_TABLET | Freq: Every day | ORAL | Status: DC

## 2013-08-18 NOTE — Progress Notes (Signed)
Patient here for follow up History of COPD and HTN

## 2013-08-18 NOTE — Progress Notes (Signed)
MRN: [id number] Name: Nathaniel RollingDavid J Gilani  Sex: male Age: 63 y.o. DOB: 09-28-1950  Allergies: Motrin and Nsaids  Chief Complaint  Patient presents with  . Follow-up    HPI: Patient is 63 y.o. male who has history of hypertension, COPD, GERD comes today for followup her, denies any headache dizziness chest and shortness of breath, patient is requesting refill on his medications. Patient denies smoking cigarettes, today we discussed about colonoscopy patient wants to think about it.  Past Medical History  Diagnosis Date  . Bilateral pneumonia 07/31/2007  . Asthma   . Chronic cough   . Pansinusitis   . COPD (chronic obstructive pulmonary disease)   . Hypertension   . Shortness of breath   . Chronic kidney disease   . GERD (gastroesophageal reflux disease)     Past Surgical History  Procedure Laterality Date  . Left hand surgery  08/2006      Medication List       This list is accurate as of: 08/18/13 10:24 AM.  Always use your most recent med list.               albuterol 108 (90 BASE) MCG/ACT inhaler  Commonly known as:  PROVENTIL HFA;VENTOLIN HFA  Inhale 2 puffs into the lungs every 6 (six) hours as needed for wheezing.     albuterol (2.5 MG/3ML) 0.083% nebulizer solution  Commonly known as:  PROVENTIL  Take 2.5 mg by nebulization every 4 (four) hours as needed for wheezing or shortness of breath.     azithromycin 250 MG tablet  Commonly known as:  ZITHROMAX Z-PAK  Take as directed     benzonatate 100 MG capsule  Commonly known as:  TESSALON  Take 1 capsule (100 mg total) by mouth 3 (three) times daily as needed for cough.     budesonide-formoterol 160-4.5 MCG/ACT inhaler  Commonly known as:  SYMBICORT  Take 2 puffs first thing in am and then another 2 puffs about 12 hours later.     fluticasone 50 MCG/ACT nasal spray  Commonly known as:  FLONASE  Place 2 sprays into both nostrils daily.     losartan 50 MG tablet  Commonly known as:  COZAAR  Take 1  tablet (50 mg total) by mouth daily.     PEPCID PO  Take 1 tablet by mouth at bedtime.     predniSONE 10 MG tablet  Commonly known as:  DELTASONE  4 tabs po daily x 3 days then 3 tabs po daily x 3 days then 2 tabs po daily x 3 days then 1 tab po daily x 3 days then STOP        Meds ordered this encounter  Medications  . albuterol (PROVENTIL HFA;VENTOLIN HFA) 108 (90 BASE) MCG/ACT inhaler    Sig: Inhale 2 puffs into the lungs every 6 (six) hours as needed for wheezing.    Dispense:  1 Inhaler    Refill:  11  . budesonide-formoterol (SYMBICORT) 160-4.5 MCG/ACT inhaler    Sig: Take 2 puffs first thing in am and then another 2 puffs about 12 hours later.    Dispense:  1 Inhaler    Refill:  11  . losartan (COZAAR) 50 MG tablet    Sig: Take 1 tablet (50 mg total) by mouth daily.    Dispense:  30 tablet    Refill:  11    Immunization History  Administered Date(s) Administered  . Influenza Split 11/14/2010  . Influenza Whole  11/06/2007, 11/08/2008, 10/31/2009, 11/12/2011  . Influenza,inj,Quad PF,36+ Mos 12/07/2012  . Pneumococcal Polysaccharide-23 10/31/2009, 12/31/2012    History reviewed. No pertinent family history.  History  Substance Use Topics  . Smoking status: Former Smoker -- 0.50 packs/day for 4 years    Quit date: 02/11/1997  . Smokeless tobacco: Not on file  . Alcohol Use: No    Review of Systems   As noted in HPI  Filed Vitals:   08/18/13 0947  BP: 103/66  Pulse: 81  Temp: 97.7 F (36.5 C)  Resp: 16    Physical Exam  Physical Exam  Constitutional: No distress.  Eyes: EOM are normal. Pupils are equal, round, and reactive to light.  Cardiovascular: Normal rate and regular rhythm.   Pulmonary/Chest: Breath sounds normal. No respiratory distress. He has no wheezes. He has no rales.    CBC    Component Value Date/Time   WBC 9.4 12/31/2012 0330   RBC 4.35 12/31/2012 0330   HGB 12.0* 12/31/2012 0330   HCT 34.8* 12/31/2012 0330   PLT 189  12/31/2012 0330   MCV 80.0 12/31/2012 0330   LYMPHSABS 5.9* 12/30/2012 0126   MONOABS 0.9 12/30/2012 0126   EOSABS 1.1* 12/30/2012 0126   BASOSABS 0.0 12/30/2012 0126    CMP     Component Value Date/Time   NA 137 12/31/2012 0330   K 4.5 12/31/2012 0330   CL 104 12/31/2012 0330   CO2 20 12/31/2012 0330   GLUCOSE 125* 12/31/2012 0330   BUN 24* 12/31/2012 0330   CREATININE 1.16 12/31/2012 0330   CREATININE 1.16 10/05/2012 0941   CALCIUM 8.7 12/31/2012 0330   PROT 7.1 12/30/2012 0126   ALBUMIN 3.2* 12/30/2012 0126   AST 35 12/30/2012 0126   ALT 28 12/30/2012 0126   ALKPHOS 73 12/30/2012 0126   BILITOT 0.4 12/30/2012 0126   GFRNONAA 65* 12/31/2012 0330   GFRNONAA 52* 09/09/2012 1359   GFRAA 75* 12/31/2012 0330   GFRAA 60 09/09/2012 1359    Lab Results  Component Value Date/Time   CHOL 178 08/21/2012 12:25 PM    No components found with this basename: hga1c    Lab Results  Component Value Date/Time   AST 35 12/30/2012  1:26 AM    Assessment and Plan  Essential hypertension, benign - Plan: Blood pressure is borderline low, I have advised patient to check his blood pressure at home if it is persistently on the low side he can take half a pill, Will check his blood work Lipid panel, COMPLETE METABOLIC PANEL WITH GFR, losartan (COZAAR) 50 MG tablet  COPD GOLD II with marked reversibility  - Plan: albuterol (PROVENTIL HFA;VENTOLIN HFA) 108 (90 BASE) MCG/ACT inhaler, budesonide-formoterol (SYMBICORT) 160-4.5 MCG/ACT inhaler   Return in about 3 months (around 11/18/2013) for COPD, hypertension.  Doris CheadleADVANI, Yuval Rubens, MD

## 2013-11-18 ENCOUNTER — Ambulatory Visit: Payer: No Typology Code available for payment source | Admitting: Internal Medicine

## 2013-12-06 ENCOUNTER — Encounter: Payer: Self-pay | Admitting: Internal Medicine

## 2013-12-06 ENCOUNTER — Ambulatory Visit: Payer: Medicare Other | Attending: Internal Medicine | Admitting: Internal Medicine

## 2013-12-06 VITALS — BP 143/93 | HR 78 | Temp 98.0°F | Resp 16 | Wt 174.6 lb

## 2013-12-06 DIAGNOSIS — R05 Cough: Secondary | ICD-10-CM | POA: Diagnosis not present

## 2013-12-06 DIAGNOSIS — Z79899 Other long term (current) drug therapy: Secondary | ICD-10-CM | POA: Insufficient documentation

## 2013-12-06 DIAGNOSIS — X58XXXA Exposure to other specified factors, initial encounter: Secondary | ICD-10-CM | POA: Insufficient documentation

## 2013-12-06 DIAGNOSIS — Z91048 Other nonmedicinal substance allergy status: Secondary | ICD-10-CM

## 2013-12-06 DIAGNOSIS — R0981 Nasal congestion: Secondary | ICD-10-CM | POA: Diagnosis not present

## 2013-12-06 DIAGNOSIS — Z9109 Other allergy status, other than to drugs and biological substances: Secondary | ICD-10-CM

## 2013-12-06 DIAGNOSIS — I129 Hypertensive chronic kidney disease with stage 1 through stage 4 chronic kidney disease, or unspecified chronic kidney disease: Secondary | ICD-10-CM | POA: Diagnosis not present

## 2013-12-06 DIAGNOSIS — K219 Gastro-esophageal reflux disease without esophagitis: Secondary | ICD-10-CM | POA: Diagnosis not present

## 2013-12-06 DIAGNOSIS — N189 Chronic kidney disease, unspecified: Secondary | ICD-10-CM | POA: Insufficient documentation

## 2013-12-06 DIAGNOSIS — J449 Chronic obstructive pulmonary disease, unspecified: Secondary | ICD-10-CM | POA: Insufficient documentation

## 2013-12-06 DIAGNOSIS — I10 Essential (primary) hypertension: Secondary | ICD-10-CM

## 2013-12-06 DIAGNOSIS — T7840XA Allergy, unspecified, initial encounter: Secondary | ICD-10-CM | POA: Insufficient documentation

## 2013-12-06 DIAGNOSIS — Z87891 Personal history of nicotine dependence: Secondary | ICD-10-CM | POA: Insufficient documentation

## 2013-12-06 DIAGNOSIS — J45998 Other asthma: Secondary | ICD-10-CM | POA: Diagnosis not present

## 2013-12-06 MED ORDER — FLUTICASONE PROPIONATE 50 MCG/ACT NA SUSP: 2.0000 | Freq: Every day | NASAL | Status: DC

## 2013-12-06 NOTE — Progress Notes (Signed)
Patient here with son Son states his dad has been running temperatures at home Has a mild cough due to congestion Complains of itching for the past two weeks

## 2013-12-06 NOTE — Progress Notes (Signed)
MRN: [id number] Name: Nathaniel RollingDavid J Madarang  Sex: male Age: 63 y.o. DOB: May 05, 1950  Allergies: Motrin and Nsaids  Chief Complaint  Patient presents with  . Follow-up    HPI: Patient is 63 y.o. male who has history of hypertension COPD GERD comes today reported to have environmental allergies itchy eyes some nasal congestion postnasal drip and cough denies any chest pain or shortness of breath has been compliant in using his inhalers currently on Symbicort, last time patient used albuterol was 2 weeks ago, denies smoking cigarettes, today's blood pressure is borderline elevated as per patient he has not taken his blood pressure medication today.  Past Medical History  Diagnosis Date  . Bilateral pneumonia 07/31/2007  . Asthma   . Chronic cough   . Pansinusitis   . COPD (chronic obstructive pulmonary disease)   . Hypertension   . Shortness of breath   . Chronic kidney disease   . GERD (gastroesophageal reflux disease)     Past Surgical History  Procedure Laterality Date  . Left hand surgery  08/2006      Medication List       This list is accurate as of: 12/06/13 12:32 PM.  Always use your most recent med list.               albuterol 108 (90 BASE) MCG/ACT inhaler  Commonly known as:  PROVENTIL HFA;VENTOLIN HFA  Inhale 2 puffs into the lungs every 6 (six) hours as needed for wheezing.     albuterol (2.5 MG/3ML) 0.083% nebulizer solution  Commonly known as:  PROVENTIL  Take 2.5 mg by nebulization every 4 (four) hours as needed for wheezing or shortness of breath.     azithromycin 250 MG tablet  Commonly known as:  ZITHROMAX Z-PAK  Take as directed     benzonatate 100 MG capsule  Commonly known as:  TESSALON  Take 1 capsule (100 mg total) by mouth 3 (three) times daily as needed for cough.     budesonide-formoterol 160-4.5 MCG/ACT inhaler  Commonly known as:  SYMBICORT  Take 2 puffs first thing in am and then another 2 puffs about 12 hours later.     fluticasone 50 MCG/ACT nasal spray  Commonly known as:  FLONASE  Place 2 sprays into both nostrils daily.     losartan 50 MG tablet  Commonly known as:  COZAAR  Take 1 tablet (50 mg total) by mouth daily.     PEPCID PO  Take 1 tablet by mouth at bedtime.     predniSONE 10 MG tablet  Commonly known as:  DELTASONE  4 tabs po daily x 3 days then 3 tabs po daily x 3 days then 2 tabs po daily x 3 days then 1 tab po daily x 3 days then STOP        Meds ordered this encounter  Medications  . fluticasone (FLONASE) 50 MCG/ACT nasal spray    Sig: Place 2 sprays into both nostrils daily.    Dispense:  16 g    Refill:  2    Immunization History  Administered Date(s) Administered  . Influenza Split 11/14/2010  . Influenza Whole 11/06/2007, 11/08/2008, 10/31/2009, 11/12/2011  . Influenza,inj,Quad PF,36+ Mos 12/07/2012  . Pneumococcal Polysaccharide-23 10/31/2009, 12/31/2012    History reviewed. No pertinent family history.  History  Substance Use Topics  . Smoking status: Former Smoker -- 0.50 packs/day for 4 years    Quit date: 02/11/1997  . Smokeless tobacco: Not on file  .  Alcohol Use: No    Review of Systems   As noted in HPI  Filed Vitals:   12/06/13 1201  BP: 143/93  Pulse: 78  Temp: 98 F (36.7 C)  Resp: 16    Physical Exam  Physical Exam  HENT:  Minimal nasal congestion no sinus tenderness  Eyes: EOM are normal. Pupils are equal, round, and reactive to light.  Cardiovascular: Normal rate and regular rhythm.   Pulmonary/Chest: Breath sounds normal. No respiratory distress. He has no wheezes. He has no rales.  Musculoskeletal: He exhibits no edema.    CBC    Component Value Date/Time   WBC 9.4 12/31/2012 0330   RBC 4.35 12/31/2012 0330   HGB 12.0* 12/31/2012 0330   HCT 34.8* 12/31/2012 0330   PLT 189 12/31/2012 0330   MCV 80.0 12/31/2012 0330   LYMPHSABS 5.9* 12/30/2012 0126   MONOABS 0.9 12/30/2012 0126   EOSABS 1.1* 12/30/2012 0126   BASOSABS  0.0 12/30/2012 0126    CMP     Component Value Date/Time   NA 137 12/31/2012 0330   K 4.5 12/31/2012 0330   CL 104 12/31/2012 0330   CO2 20 12/31/2012 0330   GLUCOSE 125* 12/31/2012 0330   BUN 24* 12/31/2012 0330   CREATININE 1.16 12/31/2012 0330   CREATININE 1.16 10/05/2012 0941   CALCIUM 8.7 12/31/2012 0330   PROT 7.1 12/30/2012 0126   ALBUMIN 3.2* 12/30/2012 0126   AST 35 12/30/2012 0126   ALT 28 12/30/2012 0126   ALKPHOS 73 12/30/2012 0126   BILITOT 0.4 12/30/2012 0126   GFRNONAA 65* 12/31/2012 0330   GFRNONAA 52* 09/09/2012 1359   GFRAA 75* 12/31/2012 0330   GFRAA 60 09/09/2012 1359    Lab Results  Component Value Date/Time   CHOL 178 08/21/2012 12:25 PM    No components found with this basename: hga1c    Lab Results  Component Value Date/Time   AST 35 12/30/2012  1:26 AM    Assessment and Plan  Essential hypertension, benign - Plan: Currently patient is on losartan, will repeat her blood chemistry COMPLETE METABOLIC PANEL WITH GFR, also check Lipid panel  COPD mixed type Symptoms are stable continue with her Symbicort and albuterol when necessary.  Nasal congestion - Plan: fluticasone (FLONASE) 50 MCG/ACT nasal spray  Environmental allergies Patient will try OTC Claritin.   Return in about 3 months (around 03/08/2014) for hypertension, COPD.  Doris CheadleADVANI, Rashika Bettes, MD

## 2013-12-06 NOTE — Patient Instructions (Signed)
DASH Eating Plan °DASH stands for "Dietary Approaches to Stop Hypertension." The DASH eating plan is a healthy eating plan that has been shown to reduce high blood pressure (hypertension). Additional health benefits may include reducing the risk of type 2 diabetes mellitus, heart disease, and stroke. The DASH eating plan may also help with weight loss. °WHAT DO I NEED TO KNOW ABOUT THE DASH EATING PLAN? °For the DASH eating plan, you will follow these general guidelines: °· Choose foods with a percent daily value for sodium of less than 5% (as listed on the food label). °· Use salt-free seasonings or herbs instead of table salt or sea salt. °· Check with your health care provider or pharmacist before using salt substitutes. °· Eat lower-sodium products, often labeled as "lower sodium" or "no salt added." °· Eat fresh foods. °· Eat more vegetables, fruits, and low-fat dairy products. °· Choose whole grains. Look for the word "whole" as the first word in the ingredient list. °· Choose fish and skinless chicken or turkey more often than red meat. Limit fish, poultry, and meat to 6 oz (170 g) each day. °· Limit sweets, desserts, sugars, and sugary drinks. °· Choose heart-healthy fats. °· Limit cheese to 1 oz (28 g) per day. °· Eat more home-cooked food and less restaurant, buffet, and fast food. °· Limit fried foods. °· Cook foods using methods other than frying. °· Limit canned vegetables. If you do use them, rinse them well to decrease the sodium. °· When eating at a restaurant, ask that your food be prepared with less salt, or no salt if possible. °WHAT FOODS CAN I EAT? °Seek help from a dietitian for individual calorie needs. °Grains °Whole grain or whole wheat bread. Brown rice. Whole grain or whole wheat pasta. Quinoa, bulgur, and whole grain cereals. Low-sodium cereals. Corn or whole wheat flour tortillas. Whole grain cornbread. Whole grain crackers. Low-sodium crackers. °Vegetables °Fresh or frozen vegetables  (raw, steamed, roasted, or grilled). Low-sodium or reduced-sodium tomato and vegetable juices. Low-sodium or reduced-sodium tomato sauce and paste. Low-sodium or reduced-sodium canned vegetables.  °Fruits °All fresh, canned (in natural juice), or frozen fruits. °Meat and Other Protein Products °Ground beef (85% or leaner), grass-fed beef, or beef trimmed of fat. Skinless chicken or turkey. Ground chicken or turkey. Pork trimmed of fat. All fish and seafood. Eggs. Dried beans, peas, or lentils. Unsalted nuts and seeds. Unsalted canned beans. °Dairy °Low-fat dairy products, such as skim or 1% milk, 2% or reduced-fat cheeses, low-fat ricotta or cottage cheese, or plain low-fat yogurt. Low-sodium or reduced-sodium cheeses. °Fats and Oils °Tub margarines without trans fats. Light or reduced-fat mayonnaise and salad dressings (reduced sodium). Avocado. Safflower, olive, or canola oils. Natural peanut or almond butter. °Other °Unsalted popcorn and pretzels. °The items listed above may not be a complete list of recommended foods or beverages. Contact your dietitian for more options. °WHAT FOODS ARE NOT RECOMMENDED? °Grains °White bread. White pasta. White rice. Refined cornbread. Bagels and croissants. Crackers that contain trans fat. °Vegetables °Creamed or fried vegetables. Vegetables in a cheese sauce. Regular canned vegetables. Regular canned tomato sauce and paste. Regular tomato and vegetable juices. °Fruits °Dried fruits. Canned fruit in light or heavy syrup. Fruit juice. °Meat and Other Protein Products °Fatty cuts of meat. Ribs, chicken wings, bacon, sausage, bologna, salami, chitterlings, fatback, hot dogs, bratwurst, and packaged luncheon meats. Salted nuts and seeds. Canned beans with salt. °Dairy °Whole or 2% milk, cream, half-and-half, and cream cheese. Whole-fat or sweetened yogurt. Full-fat   cheeses or blue cheese. Nondairy creamers and whipped toppings. Processed cheese, cheese spreads, or cheese  curds. °Condiments °Onion and garlic salt, seasoned salt, table salt, and sea salt. Canned and packaged gravies. Worcestershire sauce. Tartar sauce. Barbecue sauce. Teriyaki sauce. Soy sauce, including reduced sodium. Steak sauce. Fish sauce. Oyster sauce. Cocktail sauce. Horseradish. Ketchup and mustard. Meat flavorings and tenderizers. Bouillon cubes. Hot sauce. Tabasco sauce. Marinades. Taco seasonings. Relishes. °Fats and Oils °Butter, stick margarine, lard, shortening, ghee, and bacon fat. Coconut, palm kernel, or palm oils. Regular salad dressings. °Other °Pickles and olives. Salted popcorn and pretzels. °The items listed above may not be a complete list of foods and beverages to avoid. Contact your dietitian for more information. °WHERE CAN I FIND MORE INFORMATION? °National [employer], [employer], [employer]: www.nhlbi.nih.gov/health/health-topics/topics/dash/ °Document Released: 01/17/2011 Document Revised: 06/14/2013 Document Reviewed: 12/02/2012 °Exit[employer]® Patient Information ©2015 [employer]. This information is not intended to replace advice given to you by your health care provider. Make sure you discuss any questions you have with your health care provider. ° °

## 2013-12-07 ENCOUNTER — Telehealth: Payer: Self-pay | Admitting: *Deleted

## 2013-12-07 LAB — COMPLETE METABOLIC PANEL WITH GFR
ALT: 17 U/L (ref 0–53)
AST: 26 U/L (ref 0–37)
Albumin: 3.8 g/dL (ref 3.5–5.2)
Alkaline Phosphatase: 60 U/L (ref 39–117)
BILIRUBIN TOTAL: 0.7 mg/dL (ref 0.2–1.2)
BUN: 13 mg/dL (ref 6–23)
CALCIUM: 9 mg/dL (ref 8.4–10.5)
CO2: 20 mEq/L (ref 19–32)
CREATININE: 1.28 mg/dL (ref 0.50–1.35)
Chloride: 108 mEq/L (ref 96–112)
GFR, EST NON AFRICAN AMERICAN: 58 mL/min — AB
GFR, Est African American: 67 mL/min
Glucose, Bld: 87 mg/dL (ref 70–99)
POTASSIUM: 4.4 meq/L (ref 3.5–5.3)
Sodium: 140 mEq/L (ref 135–145)
TOTAL PROTEIN: 6.5 g/dL (ref 6.0–8.3)

## 2013-12-07 LAB — LIPID PANEL
CHOL/HDL RATIO: 5.4 ratio
CHOLESTEROL: 166 mg/dL (ref 0–200)
HDL: 31 mg/dL — ABNORMAL LOW (ref >39)
LDL Cholesterol: 109 mg/dL — ABNORMAL HIGH (ref 0–99)
Triglycerides: 129 mg/dL (ref <150)
VLDL: 26 mg/dL (ref 0–40)

## 2013-12-07 NOTE — Telephone Encounter (Signed)
Message copied by Farrell OursEVANS, Narvel Kozub K on Tue Dec 07, 2013  2:55 PM ------      Message from: Doris CheadleADVANI, DEEPAK      Created: Tue Dec 07, 2013  1:07 PM       Call and let the  patient know that  blood work shows improvement in the cholesterol level. ------

## 2013-12-07 NOTE — Telephone Encounter (Signed)
LVM for patient to call back to inform of below results 

## 2013-12-09 NOTE — Telephone Encounter (Signed)
Spoke with patient and informed him of below results 

## 2014-02-15 ENCOUNTER — Ambulatory Visit: Payer: No Typology Code available for payment source | Attending: Internal Medicine | Admitting: Internal Medicine

## 2014-02-15 ENCOUNTER — Encounter: Payer: Self-pay | Admitting: Internal Medicine

## 2014-02-15 VITALS — BP 111/80 | HR 88 | Temp 98.0°F | Resp 16 | Wt 165.0 lb

## 2014-02-15 DIAGNOSIS — J069 Acute upper respiratory infection, unspecified: Secondary | ICD-10-CM | POA: Insufficient documentation

## 2014-02-15 DIAGNOSIS — J329 Chronic sinusitis, unspecified: Secondary | ICD-10-CM

## 2014-02-15 DIAGNOSIS — R059 Cough, unspecified: Secondary | ICD-10-CM

## 2014-02-15 DIAGNOSIS — Z7951 Long term (current) use of inhaled steroids: Secondary | ICD-10-CM | POA: Insufficient documentation

## 2014-02-15 DIAGNOSIS — J449 Chronic obstructive pulmonary disease, unspecified: Secondary | ICD-10-CM | POA: Insufficient documentation

## 2014-02-15 DIAGNOSIS — Z87891 Personal history of nicotine dependence: Secondary | ICD-10-CM | POA: Insufficient documentation

## 2014-02-15 DIAGNOSIS — R05 Cough: Secondary | ICD-10-CM

## 2014-02-15 MED ORDER — BENZONATATE 100 MG PO CAPS: 100.0000 mg | ORAL_CAPSULE | Freq: Three times a day (TID) | ORAL | Status: DC | PRN

## 2014-02-15 MED ORDER — METHYLPREDNISOLONE 4 MG PO KIT: PACK | ORAL | Status: DC

## 2014-02-15 MED ORDER — AZITHROMYCIN 250 MG PO TABS: ORAL_TABLET | ORAL | Status: DC

## 2014-02-15 NOTE — Progress Notes (Signed)
Patient here with his son Complains of cough congestion and post nasal drip Patient states can not sleep more than a couple of hours due to all his coughing flonase is not really helping So states his dad is usually prescribed either a z pack or prednisone

## 2014-02-15 NOTE — Progress Notes (Signed)
MRN: [id number] Name: Nathaniel Hartman  Sex: male Age: 64 y.o. DOB: [birth year]  Allergies: Motrin and Nsaids  Chief Complaint  Patient presents with  . Cough    HPI: Patient is 64 y.o. male who has to of chronic sinusitis, COPD, hypertension comes today complaining of nasal congestion postnasal drip cough he sometimes gets severe and patient become short of breath, as per patient he has been using his Symbicort regularly and uses albuterol when necessary, patient had a CT scan of sinuses done in the past ?seen by ENT , currently denies any fever chills as per patient he has been using nasal spray without much improvement.patient denies smoking cigarettes.  Past Medical History  Diagnosis Date  . Bilateral pneumonia 07/31/2007  . Asthma   . Chronic cough   . Pansinusitis   . COPD (chronic obstructive pulmonary disease)   . Hypertension   . Shortness of breath   . Chronic kidney disease   . GERD (gastroesophageal reflux disease)     Past Surgical History  Procedure Laterality Date  . Left hand surgery  08/2006      Medication List       This list is accurate as of: 02/15/14  5:37 PM.  Always use your most recent med list.               albuterol 108 (90 BASE) MCG/ACT inhaler  Commonly known as:  PROVENTIL HFA;VENTOLIN HFA  Inhale 2 puffs into the lungs every 6 (six) hours as needed for wheezing.     albuterol (2.5 MG/3ML) 0.083% nebulizer solution  Commonly known as:  PROVENTIL  Take 2.5 mg by nebulization every 4 (four) hours as needed for wheezing or shortness of breath.     azithromycin 250 MG tablet  Commonly known as:  ZITHROMAX Z-PAK  Take as directed     benzonatate 100 MG capsule  Commonly known as:  TESSALON  Take 1 capsule (100 mg total) by mouth 3 (three) times daily as needed for cough.     benzonatate 100 MG capsule  Commonly known as:  TESSALON  Take 1 capsule (100 mg total) by mouth 3 (three) times daily as needed for cough.     budesonide-formoterol 160-4.5 MCG/ACT inhaler  Commonly known as:  SYMBICORT  Take 2 puffs first thing in am and then another 2 puffs about 12 hours later.     fluticasone 50 MCG/ACT nasal spray  Commonly known as:  FLONASE  Place 2 sprays into both nostrils daily.     losartan 50 MG tablet  Commonly known as:  COZAAR  Take 1 tablet (50 mg total) by mouth daily.     methylPREDNISolone 4 MG tablet  Commonly known as:  MEDROL DOSEPAK  follow package directions     PEPCID PO  Take 1 tablet by mouth at bedtime.     predniSONE 10 MG tablet  Commonly known as:  DELTASONE  4 tabs po daily x 3 days then 3 tabs po daily x 3 days then 2 tabs po daily x 3 days then 1 tab po daily x 3 days then STOP        Meds ordered this encounter  Medications  . azithromycin (ZITHROMAX Z-PAK) 250 MG tablet    Sig: Take as directed    Dispense:  6 each    Refill:  0  . benzonatate (TESSALON) 100 MG capsule    Sig: Take 1 capsule (100 mg total) by mouth 3 (three)  times daily as needed for cough.    Dispense:  30 capsule    Refill:  1  . methylPREDNISolone (MEDROL DOSEPAK) 4 MG tablet    Sig: follow package directions    Dispense:  21 tablet    Refill:  0    Immunization History  Administered Date(s) Administered  . Influenza Split 11/14/2010  . Influenza Whole 11/06/2007, 11/08/2008, 10/31/2009, 11/12/2011  . Influenza,inj,Quad PF,36+ Mos 12/07/2012  . Pneumococcal Polysaccharide-23 10/31/2009, 12/31/2012    History reviewed. No pertinent family history.  History  Substance Use Topics  . Smoking status: Former Smoker -- 0.50 packs/day for 4 years    Quit date: 02/11/1997  . Smokeless tobacco: Not on file  . Alcohol Use: No    Review of Systems   As noted in HPI  Filed Vitals:   02/15/14 1715  BP: 111/80  Pulse: 88  Temp: 98 F (36.7 C)  Resp: 16    Physical Exam  Physical Exam  Constitutional: No distress.  HENT:  Sinus congestion   Eyes: EOM are normal. Pupils  are equal, round, and reactive to light.  Cardiovascular: Normal rate and regular rhythm.   Pulmonary/Chest: Breath sounds normal. No respiratory distress. He has no wheezes. He has no rales.  Musculoskeletal: He exhibits no edema.    CBC    Component Value Date/Time   WBC 9.4 12/31/2012 0330   RBC 4.35 12/31/2012 0330   HGB 12.0* 12/31/2012 0330   HCT 34.8* 12/31/2012 0330   PLT 189 12/31/2012 0330   MCV 80.0 12/31/2012 0330   LYMPHSABS 5.9* 12/30/2012 0126   MONOABS 0.9 12/30/2012 0126   EOSABS 1.1* 12/30/2012 0126   BASOSABS 0.0 12/30/2012 0126    CMP     Component Value Date/Time   NA 140 12/06/2013 1234   K 4.4 12/06/2013 1234   CL 108 12/06/2013 1234   CO2 20 12/06/2013 1234   GLUCOSE 87 12/06/2013 1234   BUN 13 12/06/2013 1234   CREATININE 1.28 12/06/2013 1234   CREATININE 1.16 12/31/2012 0330   CALCIUM 9.0 12/06/2013 1234   PROT 6.5 12/06/2013 1234   ALBUMIN 3.8 12/06/2013 1234   AST 26 12/06/2013 1234   ALT 17 12/06/2013 1234   ALKPHOS 60 12/06/2013 1234   BILITOT 0.7 12/06/2013 1234   GFRNONAA 58* 12/06/2013 1234   GFRNONAA 65* 12/31/2012 0330   GFRAA 67 12/06/2013 1234   GFRAA 75* 12/31/2012 0330    Lab Results  Component Value Date/Time   CHOL 166 12/06/2013 12:34 PM    No components found for: HGA1C  Lab Results  Component Value Date/Time   AST 26 12/06/2013 12:34 PM    Assessment and Plan  URI (upper respiratory infection) - Plan: azithromycin (ZITHROMAX Z-PAK) 250 MG tablet  COPD GOLD II with marked reversibility  - Plan: methylPREDNISolone (MEDROL DOSEPAK) 4 MG tablet, continue with Symbicort and albuterol when necessary  Chronic sinusitis, unspecified location - Plan: I have ordered repeat CT [facility] LTD WO CM, patient to continue with Flonase.  Cough - Plan: benzonatate (TESSALON) 100 MG capsule   Return in about 3 months (around 05/17/2014) for hypertension.  Doris CheadleADVANI, Lacrystal Barbe, MD

## 2014-02-16 ENCOUNTER — Telehealth: Payer: Self-pay

## 2014-02-16 NOTE — Telephone Encounter (Signed)
Attempted to call patient to let him know when his CT is scheduled Spoke with mr Nathaniel Hartman and asked to have his son Nathaniel Hartman return our call To give him the information

## 2014-02-16 NOTE — Telephone Encounter (Signed)
Patient returned phone call and is aware of his father's Scheduled appointment for his CT scan

## 2014-02-21 ENCOUNTER — Ambulatory Visit (HOSPITAL_COMMUNITY)
Admission: RE | Admit: 2014-02-21 | Discharge: 2014-02-21 | Disposition: A | Discharge status: Home / Self Care | Payer: Commercial Managed Care - HMO | Source: Ambulatory Visit | Attending: Internal Medicine | Admitting: Internal Medicine

## 2014-02-21 DIAGNOSIS — R51 Headache: Secondary | ICD-10-CM | POA: Insufficient documentation

## 2014-02-21 DIAGNOSIS — J329 Chronic sinusitis, unspecified: Secondary | ICD-10-CM | POA: Diagnosis present

## 2014-02-21 DIAGNOSIS — J3489 Other specified disorders of nose and nasal sinuses: Secondary | ICD-10-CM | POA: Diagnosis not present

## 2014-02-25 ENCOUNTER — Telehealth: Payer: Self-pay | Admitting: Emergency Medicine

## 2014-02-25 NOTE — Telephone Encounter (Signed)
Left message with Ct results

## 2014-02-25 NOTE — Telephone Encounter (Signed)
-----   Message from Doris Cheadleeepak Advani, MD sent at 02/23/2014  9:49 AM EST ----- Call and let the patient know that his CT of sinuses reported mucosal thickening and opacification also suggestive of acute sinusitis, patient has already been treated with antibiotic, if patient is still symptomatic or worsening symptoms, would consider referral to ENT.

## 2014-03-03 ENCOUNTER — Telehealth: Payer: Self-pay | Admitting: Emergency Medicine

## 2014-03-03 DIAGNOSIS — J019 Acute sinusitis, unspecified: Secondary | ICD-10-CM

## 2014-03-03 NOTE — Telephone Encounter (Signed)
Son called in requesting ENT referral States father is still having sinus congestion,drainage ENT referral placed per Dr.Advani

## 2014-04-28 ENCOUNTER — Inpatient Hospital Stay (HOSPITAL_COMMUNITY)
Admission: EM | Admit: 2014-04-28 | Discharge: 2014-05-02 | DRG: 190 | Disposition: A | Discharge status: Home / Self Care | Payer: Commercial Managed Care - HMO | Attending: Internal Medicine | Admitting: Internal Medicine

## 2014-04-28 ENCOUNTER — Encounter (HOSPITAL_COMMUNITY): Payer: Self-pay | Admitting: Emergency Medicine

## 2014-04-28 ENCOUNTER — Telehealth: Payer: Self-pay | Admitting: Internal Medicine

## 2014-04-28 ENCOUNTER — Emergency Department (HOSPITAL_COMMUNITY): Payer: Commercial Managed Care - HMO

## 2014-04-28 DIAGNOSIS — Z87891 Personal history of nicotine dependence: Secondary | ICD-10-CM

## 2014-04-28 DIAGNOSIS — N182 Chronic kidney disease, stage 2 (mild): Secondary | ICD-10-CM | POA: Diagnosis present

## 2014-04-28 DIAGNOSIS — I129 Hypertensive chronic kidney disease with stage 1 through stage 4 chronic kidney disease, or unspecified chronic kidney disease: Secondary | ICD-10-CM | POA: Diagnosis present

## 2014-04-28 DIAGNOSIS — Z9981 Dependence on supplemental oxygen: Secondary | ICD-10-CM

## 2014-04-28 DIAGNOSIS — R0602 Shortness of breath: Secondary | ICD-10-CM | POA: Diagnosis not present

## 2014-04-28 DIAGNOSIS — K219 Gastro-esophageal reflux disease without esophagitis: Secondary | ICD-10-CM | POA: Diagnosis present

## 2014-04-28 DIAGNOSIS — J310 Chronic rhinitis: Secondary | ICD-10-CM | POA: Diagnosis present

## 2014-04-28 DIAGNOSIS — N289 Disorder of kidney and ureter, unspecified: Secondary | ICD-10-CM | POA: Diagnosis present

## 2014-04-28 DIAGNOSIS — Z7951 Long term (current) use of inhaled steroids: Secondary | ICD-10-CM

## 2014-04-28 DIAGNOSIS — N189 Chronic kidney disease, unspecified: Secondary | ICD-10-CM | POA: Insufficient documentation

## 2014-04-28 DIAGNOSIS — N183 Chronic kidney disease, stage 3 (moderate): Secondary | ICD-10-CM | POA: Diagnosis present

## 2014-04-28 DIAGNOSIS — J189 Pneumonia, unspecified organism: Secondary | ICD-10-CM | POA: Diagnosis present

## 2014-04-28 DIAGNOSIS — Z888 Allergy status to other drugs, medicaments and biological substances status: Secondary | ICD-10-CM | POA: Diagnosis not present

## 2014-04-28 DIAGNOSIS — J441 Chronic obstructive pulmonary disease with (acute) exacerbation: Secondary | ICD-10-CM | POA: Diagnosis present

## 2014-04-28 DIAGNOSIS — E43 Unspecified severe protein-calorie malnutrition: Secondary | ICD-10-CM | POA: Diagnosis not present

## 2014-04-28 DIAGNOSIS — Z79899 Other long term (current) drug therapy: Secondary | ICD-10-CM

## 2014-04-28 DIAGNOSIS — Z6820 Body mass index (BMI) 20.0-20.9, adult: Secondary | ICD-10-CM

## 2014-04-28 DIAGNOSIS — K210 Gastro-esophageal reflux disease with esophagitis: Secondary | ICD-10-CM | POA: Diagnosis not present

## 2014-04-28 DIAGNOSIS — J9601 Acute respiratory failure with hypoxia: Secondary | ICD-10-CM | POA: Diagnosis not present

## 2014-04-28 LAB — CBC WITH DIFFERENTIAL/PLATELET
Basophils Absolute: 0 10*3/uL (ref 0.0–0.1)
Basophils Relative: 1 % (ref 0–1)
Eosinophils Absolute: 1.1 10*3/uL — ABNORMAL HIGH (ref 0.0–0.7)
Eosinophils Relative: 19 % — ABNORMAL HIGH (ref 0–5)
HCT: 41 % (ref 39.0–52.0)
Hemoglobin: 13.7 g/dL (ref 13.0–17.0)
Lymphocytes Relative: 31 % (ref 12–46)
Lymphs Abs: 1.8 10*3/uL (ref 0.7–4.0)
MCH: 27.2 pg (ref 26.0–34.0)
MCHC: 33.4 g/dL (ref 30.0–36.0)
MCV: 81.5 fL (ref 78.0–100.0)
Monocytes Absolute: 0.2 10*3/uL (ref 0.1–1.0)
Monocytes Relative: 4 % (ref 3–12)
Neutro Abs: 2.6 10*3/uL (ref 1.7–7.7)
Neutrophils Relative %: 45 % (ref 43–77)
Platelets: 206 10*3/uL (ref 150–400)
RBC: 5.03 MIL/uL (ref 4.22–5.81)
RDW: 14.9 % (ref 11.5–15.5)
WBC: 5.8 10*3/uL (ref 4.0–10.5)

## 2014-04-28 LAB — BASIC METABOLIC PANEL
Anion gap: 5 (ref 5–15)
BUN: 30 mg/dL — ABNORMAL HIGH (ref 6–23)
CO2: 26 mmol/L (ref 19–32)
Calcium: 9.1 mg/dL (ref 8.4–10.5)
Chloride: 107 mmol/L (ref 96–112)
Creatinine, Ser: 1.46 mg/dL — ABNORMAL HIGH (ref 0.50–1.35)
GFR calc Af Amer: 56 mL/min — ABNORMAL LOW (ref >90)
GFR calc non Af Amer: 49 mL/min — ABNORMAL LOW (ref >90)
Glucose, Bld: 101 mg/dL — ABNORMAL HIGH (ref 70–99)
Potassium: 4.1 mmol/L (ref 3.5–5.1)
Sodium: 138 mmol/L (ref 135–145)

## 2014-04-28 LAB — URINALYSIS, ROUTINE W REFLEX MICROSCOPIC
Bilirubin Urine: NEGATIVE
Glucose, UA: NEGATIVE mg/dL
Hgb urine dipstick: NEGATIVE
Ketones, ur: NEGATIVE mg/dL
Leukocytes, UA: NEGATIVE
Nitrite: NEGATIVE
Protein, ur: NEGATIVE mg/dL
Specific Gravity, Urine: 1.01 (ref 1.005–1.030)
Urobilinogen, UA: 0.2 mg/dL (ref 0.0–1.0)
pH: 6 (ref 5.0–8.0)

## 2014-04-28 LAB — BRAIN NATRIURETIC PEPTIDE: B Natriuretic Peptide: 14.7 pg/mL (ref 0.0–100.0)

## 2014-04-28 MED ORDER — IPRATROPIUM-ALBUTEROL 0.5-2.5 (3) MG/3ML IN SOLN
3.0000 mL | RESPIRATORY_TRACT | Status: DC | PRN
  Administered 2014-04-30: 3 mL via RESPIRATORY_TRACT
  Filled 2014-04-28 (×2): qty 3

## 2014-04-28 MED ORDER — IPRATROPIUM-ALBUTEROL 0.5-2.5 (3) MG/3ML IN SOLN
3.0000 mL | RESPIRATORY_TRACT | Status: DC
  Administered 2014-04-28 – 2014-04-29 (×2): 3 mL via RESPIRATORY_TRACT
  Filled 2014-04-28: qty 3

## 2014-04-28 MED ORDER — METHYLPREDNISOLONE SODIUM SUCC 125 MG IJ SOLR
125.0000 mg | Freq: Two times a day (BID) | INTRAMUSCULAR | Status: DC
  Administered 2014-04-28 – 2014-04-29 (×2): 125 mg via INTRAVENOUS
  Filled 2014-04-28 (×4): qty 2

## 2014-04-28 MED ORDER — ALBUTEROL (5 MG/ML) CONTINUOUS INHALATION SOLN
10.0000 mg/h | INHALATION_SOLUTION | RESPIRATORY_TRACT | Status: AC
  Administered 2014-04-28: 10 mg/h via RESPIRATORY_TRACT
  Filled 2014-04-28: qty 20

## 2014-04-28 MED ORDER — AZITHROMYCIN 500 MG IV SOLR
500.0000 mg | Freq: Once | INTRAVENOUS | Status: AC
  Administered 2014-04-28: 500 mg via INTRAVENOUS
  Filled 2014-04-28: qty 500

## 2014-04-28 MED ORDER — DEXTROSE 5 % IV SOLN
1.0000 g | INTRAVENOUS | Status: DC
  Administered 2014-04-29 – 2014-05-01 (×3): 1 g via INTRAVENOUS
  Filled 2014-04-28 (×3): qty 10

## 2014-04-28 MED ORDER — SODIUM CHLORIDE 0.9 % IV SOLN
INTRAVENOUS | Status: DC
  Administered 2014-04-28 – 2014-05-01 (×6): via INTRAVENOUS
  Administered 2014-05-02: 100 mL via INTRAVENOUS

## 2014-04-28 MED ORDER — DEXTROSE 5 % IV SOLN: 500.0000 mg | INTRAVENOUS | Status: DC

## 2014-04-28 MED ORDER — DEXTROSE 5 % IV SOLN
500.0000 mg | INTRAVENOUS | Status: DC
  Administered 2014-04-29 – 2014-05-01 (×3): 500 mg via INTRAVENOUS
  Filled 2014-04-28 (×3): qty 500

## 2014-04-28 MED ORDER — DEXTROSE 5 % IV SOLN
1.0000 g | Freq: Once | INTRAVENOUS | Status: AC
  Administered 2014-04-28: 1 g via INTRAVENOUS
  Filled 2014-04-28: qty 10

## 2014-04-28 MED ORDER — HEPARIN SODIUM (PORCINE) 5000 UNIT/ML IJ SOLN
5000.0000 [IU] | Freq: Three times a day (TID) | INTRAMUSCULAR | Status: DC
  Administered 2014-04-28 – 2014-05-02 (×11): 5000 [IU] via SUBCUTANEOUS
  Filled 2014-04-28 (×16): qty 1

## 2014-04-28 MED ORDER — DEXTROSE 5 % IV SOLN: 1.0000 g | INTRAVENOUS | Status: DC

## 2014-04-28 NOTE — H&P (Addendum)
Hospitalist Admission History and Physical  Patient name: Nathaniel Hartman Medical record number: [id number] Date of birth: 01/10/1951 Age: 64 y.o. Gender: male  Primary Care Provider: Doris Cheadle, MD  Chief Complaint: acute resp failure w/ hypoxia, CAP   History of Present Illness:This is a 64 y.o. year old male with significant past medical history of COPD, asthma presenting with acute resp failure w/ hypoxia, CAP. Pt reports SOB, cough, wheezing over past 2 weeks. No fevers or chills. Non smoker. Progressively worsening sxs over past 2 weeks. Increased inhaler use. Evaluated by EMS at home. Given albuterol and solumedrol.  Presented to ER afebrile, hemodynamically stable. Noted to desat into mid 80s w/o O2. Intermittently desatting to low 90s/high 80s w/ supplemental O2. CBC and CMET grossly WNL apart from Cr 1.46. CXR concerning for RML infiltrate.    Assessment and Plan: Nathaniel Hartman is a 64 y.o. year old male presenting with acute resp failure w/ hypoxia, CAP   Active Problems:   Acute respiratory failure with hypoxia   1- Acute resp failure w/ hypoxia -likely multifactorial w/ contributions of CAP, mild COPD flare  -IV rocephin and azithro -urine strep, legionella, blood cultures -supplemental O2 prn -IV solumedrol  -duonebs -I/S  2- COPD  -mild flare in setting of above -IV solumedrol  -duonebs -supplemental O2 prn  3- CKD -stable  -follow  FEN/GI: heart healthy diet.  Prophylaxis: sub q heparin  Disposition: pending further evaluation  Code Status:Full Code    Patient Active Problem List   Diagnosis Date Noted  . Acute respiratory failure with hypoxia 04/28/2014  . Essential hypertension, benign 12/07/2012  . Chronic rhinitis 10/24/2010  . COPD GOLD II with marked reversibility  11/30/2007  . G E R D 11/05/2007   Past Medical History: Past Medical History  Diagnosis Date  . Bilateral pneumonia 07/31/2007  . Asthma   . Chronic cough   . Pansinusitis    . COPD (chronic obstructive pulmonary disease)   . Hypertension   . Shortness of breath   . Chronic kidney disease   . GERD (gastroesophageal reflux disease)     Past Surgical History: Past Surgical History  Procedure Laterality Date  . Left hand surgery  08/2006    Social History: History   Social History  . Marital Status: Single    Spouse Name: N/A  . Number of Children: N/A  . Years of Education: N/A   Occupational History  . metal work    Social History Main Topics  . Smoking status: Former Smoker -- 0.50 packs/day for 4 years    Quit date: 02/11/1997  . Smokeless tobacco: Not on file  . Alcohol Use: No  . Drug Use: No  . Sexual Activity: Not on file   Other Topics Concern  . None   Social History Narrative    Family History: No family history on file.  Allergies: Allergies  Allergen Reactions  . Motrin [Ibuprofen] Shortness Of Breath  . Nsaids Shortness Of Breath    Current Facility-Administered Medications  Medication Dose Route Frequency Provider Last Rate Last Dose  . 0.9 %  sodium chloride infusion   Intravenous Continuous Floydene Flock, MD      . azithromycin [facility]) 500 mg in dextrose 5 % 250 mL IVPB  500 mg Intravenous Once Charlestine Night, PA-C 250 mL/hr at 04/28/14 1958 500 mg at 04/28/14 1958  . azithromycin (ZITHROMAX) 500 mg in dextrose 5 % 250 mL IVPB  500 mg Intravenous Q24H Floydene Flock,  MD      . cefTRIAXone (ROCEPHIN) 1 g in dextrose 5 % 50 mL IVPB  1 g Intravenous Q24H Floydene Flock, MD      . heparin injection 5,000 Units  5,000 Units Subcutaneous 3 times per day Floydene Flock, MD      . ipratropium-albuterol (DUONEB) 0.5-2.5 (3) MG/3ML nebulizer solution 3 mL  3 mL Nebulization Q4H Floydene Flock, MD      . ipratropium-albuterol (DUONEB) 0.5-2.5 (3) MG/3ML nebulizer solution 3 mL  3 mL Nebulization Q2H PRN Floydene Flock, MD      . methylPREDNISolone sodium succinate (SOLU-MEDROL) 125 mg/2 mL injection 125 mg  125  mg Intravenous Q12H Floydene Flock, MD       Current Outpatient Prescriptions  Medication Sig Dispense Refill  . albuterol (PROVENTIL HFA;VENTOLIN HFA) 108 (90 BASE) MCG/ACT inhaler Inhale 2 puffs into the lungs every 6 (six) hours as needed for wheezing. 1 Inhaler 11  . budesonide-formoterol (SYMBICORT) 160-4.5 MCG/ACT inhaler Take 2 puffs first thing in am and then another 2 puffs about 12 hours later. 1 Inhaler 11  . fluticasone (FLONASE) 50 MCG/ACT nasal spray Place 2 sprays into both nostrils daily. 16 g 2  . albuterol (PROVENTIL) (2.5 MG/3ML) 0.083% nebulizer solution Take 2.5 mg by nebulization every 4 (four) hours as needed for wheezing or shortness of breath.    Marland Kitchen azithromycin (ZITHROMAX Z-PAK) 250 MG tablet Take as directed (Patient not taking: Reported on 04/28/2014) 6 each 0  . benzonatate (TESSALON) 100 MG capsule Take 1 capsule (100 mg total) by mouth 3 (three) times daily as needed for cough. (Patient not taking: Reported on 04/28/2014) 30 capsule 1  . benzonatate (TESSALON) 100 MG capsule Take 1 capsule (100 mg total) by mouth 3 (three) times daily as needed for cough. (Patient not taking: Reported on 04/28/2014) 30 capsule 1  . Famotidine (PEPCID PO) Take 1 tablet by mouth at bedtime.     Marland Kitchen losartan (COZAAR) 50 MG tablet Take 1 tablet (50 mg total) by mouth daily. (Patient not taking: Reported on 04/28/2014) 30 tablet 11  . methylPREDNISolone (MEDROL DOSEPAK) 4 MG tablet follow package directions (Patient not taking: Reported on 04/28/2014) 21 tablet 0  . predniSONE (DELTASONE) 10 MG tablet 4 tabs po daily x 3 days then 3 tabs po daily x 3 days then 2 tabs po daily x 3 days then 1 tab po daily x 3 days then STOP (Patient not taking: Reported on 04/28/2014) 30 tablet 0  . [DISCONTINUED] cetirizine (ZYRTEC) 10 MG tablet Take 10 mg by mouth at bedtime as needed.      . [DISCONTINUED] omeprazole (PRILOSEC OTC) 20 MG tablet Take 20 mg by mouth daily.       Review Of Systems: 12 point ROS  negative except as noted above in HPI.  Physical Exam: Filed Vitals:   04/28/14 1918  BP: 109/72  Pulse: 96  Temp:   Resp: 11    General: alert and cooperative HEENT: PERRLA and extra ocular movement intact Heart: S1, S2 normal, no murmur, rub or gallop, regular rate and rhythm Lungs: unlabored breathing, expiratory wheezes and bibasilar rales Abdomen: abdomen is soft without significant tenderness, masses, organomegaly or guarding Extremities: extremities normal, atraumatic, no cyanosis or edema Skin:no rashes Neurology: normal without focal findings  Labs and Imaging: Lab Results  Component Value Date/Time   NA 138 04/28/2014 06:29 PM   K 4.1 04/28/2014 06:29 PM   CL 107 04/28/2014 06:29 PM  CO2 26 04/28/2014 06:29 PM   BUN 30* 04/28/2014 06:29 PM   CREATININE 1.46* 04/28/2014 06:29 PM   CREATININE 1.28 12/06/2013 12:34 PM   GLUCOSE 101* 04/28/2014 06:29 PM   Lab Results  Component Value Date   WBC 5.8 04/28/2014   HGB 13.7 04/28/2014   HCT 41.0 04/28/2014   MCV 81.5 04/28/2014   PLT 206 04/28/2014    Dg Chest 2 View  04/28/2014   CLINICAL DATA:  Shortness of breath and cough for 1 week  EXAM: CHEST  2 VIEW  COMPARISON:  12/31/2012  FINDINGS: Cardiac shadow is stable. Increased density is noted along the right heart border consistent with right middle lobe infiltrate. No associated effusion is seen. No acute bony abnormality is noted.  IMPRESSION: Right middle lobe infiltrate. Followup films to resolution following appropriate therapy recommended.   Electronically Signed   By: Alcide CleverMark  Lukens M.D.   On: 04/28/2014 18:34           Doree AlbeeSteven Kiree Dejarnette MD  Pager: [phone]

## 2014-04-28 NOTE — ED Notes (Signed)
Per ems pt from home, co sob all day, pt used inhaler 4 times prior to arrival. Pt received breathing treatment by ems and 125 mg solumedrol as well. Hx asthma. Pt denies chest pain. Pt alert and oriented x 4.

## 2014-04-28 NOTE — Telephone Encounter (Signed)
Patient is calling to request a med refill for budesonide-formoterol (SYMBICORT) 160-4.5 MCG/ACT inhaler. Patient has been out for a week.Patient uses our pharmacy, please f/u with patient.

## 2014-04-28 NOTE — Progress Notes (Signed)
Paged Dr. Newton to discuss admission status. 

## 2014-04-28 NOTE — ED Provider Notes (Signed)
CSN: [id number]     Arrival date & time 04/28/14  1658 History   First MD Initiated Contact with Patient 04/28/14 1710     Chief Complaint  Patient presents with  . Shortness of Breath     (Consider location/radiation/quality/duration/timing/severity/associated sxs/prior Treatment) HPI Patient presents to the emergency department with shortness of breath that started earlier this morning.  The patient states that he was given an albuterol treatment and steroids, but EMS.  He states that he has history of asthma and COPD and emphysema.  The patient states that his inhalers at home, did not seem to help with his symptoms.  He is on oxygen at home.  Patient denies chest pain, nausea, vomiting, diarrhea, weakness, dizziness, headache, blurred vision, back pain, neck pain, edema, or syncope. the patient was recently discharged from the hospital. Past Medical History  Diagnosis Date  . Bilateral pneumonia 07/31/2007  . Asthma   . Chronic cough   . Pansinusitis   . COPD (chronic obstructive pulmonary disease)   . Hypertension   . Shortness of breath   . Chronic kidney disease   . GERD (gastroesophageal reflux disease)    Past Surgical History  Procedure Laterality Date  . Left hand surgery  08/2006   No family history on file. History  Substance Use Topics  . Smoking status: Former Smoker -- 0.50 packs/day for 4 years    Quit date: 02/11/1997  . Smokeless tobacco: Not on file  . Alcohol Use: No    Review of Systems  All other systems negative except as documented in the HPI. All pertinent positives and negatives as reviewed in the HPI.   Allergies  Motrin and Nsaids  Home Medications   Prior to Admission medications   Medication Sig Start Date End Date Taking? Authorizing Provider  albuterol (PROVENTIL HFA;VENTOLIN HFA) 108 (90 BASE) MCG/ACT inhaler Inhale 2 puffs into the lungs every 6 (six) hours as needed for wheezing. 08/18/13  Yes Doris Cheadle, MD  budesonide-formoterol  (SYMBICORT) 160-4.5 MCG/ACT inhaler Take 2 puffs first thing in am and then another 2 puffs about 12 hours later. 08/18/13  Yes Deepak Advani, MD  fluticasone (FLONASE) 50 MCG/ACT nasal spray Place 2 sprays into both nostrils daily. 12/06/13  Yes Doris Cheadle, MD  albuterol (PROVENTIL) (2.5 MG/3ML) 0.083% nebulizer solution Take 2.5 mg by nebulization every 4 (four) hours as needed for wheezing or shortness of breath.    Historical Provider, MD  azithromycin (ZITHROMAX Z-PAK) 250 MG tablet Take as directed Patient not taking: Reported on 04/28/2014 02/15/14   Doris Cheadle, MD  benzonatate (TESSALON) 100 MG capsule Take 1 capsule (100 mg total) by mouth 3 (three) times daily as needed for cough. Patient not taking: Reported on 04/28/2014 04/28/13   Doris Cheadle, MD  benzonatate (TESSALON) 100 MG capsule Take 1 capsule (100 mg total) by mouth 3 (three) times daily as needed for cough. Patient not taking: Reported on 04/28/2014 02/15/14   Doris Cheadle, MD  Famotidine (PEPCID PO) Take 1 tablet by mouth at bedtime.     Historical Provider, MD  losartan (COZAAR) 50 MG tablet Take 1 tablet (50 mg total) by mouth daily. Patient not taking: Reported on 04/28/2014 08/18/13   Doris Cheadle, MD  methylPREDNISolone (MEDROL DOSEPAK) 4 MG tablet follow package directions Patient not taking: Reported on 04/28/2014 02/15/14   Doris Cheadle, MD  predniSONE (DELTASONE) 10 MG tablet 4 tabs po daily x 3 days then 3 tabs po daily x 3 days then 2  tabs po daily x 3 days then 1 tab po daily x 3 days then STOP Patient not taking: Reported on 04/28/2014 12/31/12   Bernadene PersonKathryn A Whiteheart, NP   BP 109/72 mmHg  Pulse 96  Temp(Src) 97.7 F (36.5 C) (Oral)  Resp 11  SpO2 96% Physical Exam  Constitutional: He is oriented to person, place, and time. He appears well-developed and well-nourished. No distress. He is not intubated.  HENT:  Head: Normocephalic and atraumatic.  Mouth/Throat: Oropharynx is clear and moist.  Eyes: Pupils are  equal, round, and reactive to light.  Neck: Normal range of motion. Neck supple.  Cardiovascular: Normal rate, regular rhythm and normal heart sounds.  Exam reveals no gallop and no friction rub.   No murmur heard. Pulmonary/Chest: Tachypnea noted. No apnea. He is not intubated. He is in respiratory distress. He has wheezes. He has no rhonchi. He exhibits no tenderness.  Abdominal: Soft. Bowel sounds are normal. He exhibits no distension. There is no tenderness.  Neurological: He is alert and oriented to person, place, and time. He exhibits normal muscle tone. Coordination normal.  Skin: Skin is warm and dry. No rash noted. No erythema.  Psychiatric: He has a normal mood and affect. His behavior is normal. Judgment and thought content normal.  Nursing note and vitals reviewed.   ED Course  Procedures (including critical care time) Labs Review Labs Reviewed  CBC WITH DIFFERENTIAL/PLATELET - Abnormal; Notable for the following:    Eosinophils Relative 19 (*)    Eosinophils Absolute 1.1 (*)    All other components within normal limits  BASIC METABOLIC PANEL - Abnormal; Notable for the following:    Glucose, Bld 101 (*)    BUN 30 (*)    Creatinine, Ser 1.46 (*)    GFR calc non Af Amer 49 (*)    GFR calc Af Amer 56 (*)    All other components within normal limits  BRAIN NATRIURETIC PEPTIDE  URINALYSIS, ROUTINE W REFLEX MICROSCOPIC    Imaging Review Dg Chest 2 View  04/28/2014   CLINICAL DATA:  Shortness of breath and cough for 1 week  EXAM: CHEST  2 VIEW  COMPARISON:  12/31/2012  FINDINGS: Cardiac shadow is stable. Increased density is noted along the right heart border consistent with right middle lobe infiltrate. No associated effusion is seen. No acute bony abnormality is noted.  IMPRESSION: Right middle lobe infiltrate. Followup films to resolution following appropriate therapy recommended.   Electronically Signed   By: Alcide CleverMark  Lukens M.D.   On: 04/28/2014 18:34   <ECG> EKG: normal  EKG, normal sinus rhythm. Prolonged QT Sinus arrhythmia.     Patient will need admission to the hospital for further evaluation and care.  Patient desaturated even on his 3 L of home oxygen into the low 80s. MDM   Final diagnoses:  None       Charlestine NightChristopher Tinika Bucknam, PA-C 05/02/14 1119  Elwin MochaBlair Walden, MD 05/02/14 [phone]

## 2014-04-29 ENCOUNTER — Other Ambulatory Visit: Payer: Self-pay

## 2014-04-29 ENCOUNTER — Encounter (HOSPITAL_COMMUNITY): Payer: Self-pay | Admitting: *Deleted

## 2014-04-29 DIAGNOSIS — J441 Chronic obstructive pulmonary disease with (acute) exacerbation: Secondary | ICD-10-CM | POA: Diagnosis present

## 2014-04-29 DIAGNOSIS — J9601 Acute respiratory failure with hypoxia: Secondary | ICD-10-CM

## 2014-04-29 LAB — CBC WITH DIFFERENTIAL/PLATELET
BASOS ABS: 0 10*3/uL (ref 0.0–0.1)
Basophils Relative: 0 % (ref 0–1)
Eosinophils Absolute: 0 10*3/uL (ref 0.0–0.7)
Eosinophils Relative: 0 % (ref 0–5)
HEMATOCRIT: 38.4 % — AB (ref 39.0–52.0)
HEMOGLOBIN: 12.8 g/dL — AB (ref 13.0–17.0)
LYMPHS PCT: 20 % (ref 12–46)
Lymphs Abs: 0.7 10*3/uL (ref 0.7–4.0)
MCH: 26.8 pg (ref 26.0–34.0)
MCHC: 33.3 g/dL (ref 30.0–36.0)
MCV: 80.5 fL (ref 78.0–100.0)
MONO ABS: 0 10*3/uL — AB (ref 0.1–1.0)
Monocytes Relative: 1 % — ABNORMAL LOW (ref 3–12)
NEUTROS ABS: 2.7 10*3/uL (ref 1.7–7.7)
NEUTROS PCT: 79 % — AB (ref 43–77)
Platelets: 200 10*3/uL (ref 150–400)
RBC: 4.77 MIL/uL (ref 4.22–5.81)
RDW: 14.9 % (ref 11.5–15.5)
WBC: 3.4 10*3/uL — ABNORMAL LOW (ref 4.0–10.5)

## 2014-04-29 LAB — INFLUENZA PANEL BY PCR (TYPE A & B)
H1N1 flu by pcr: NOT DETECTED
INFLAPCR: NEGATIVE
INFLBPCR: NEGATIVE

## 2014-04-29 LAB — COMPREHENSIVE METABOLIC PANEL
ALBUMIN: 3.4 g/dL — AB (ref 3.5–5.2)
ALT: 20 U/L (ref 0–53)
ANION GAP: 11 (ref 5–15)
AST: 26 U/L (ref 0–37)
Alkaline Phosphatase: 62 U/L (ref 39–117)
BUN: 30 mg/dL — AB (ref 6–23)
CO2: 21 mmol/L (ref 19–32)
CREATININE: 1.39 mg/dL — AB (ref 0.50–1.35)
Calcium: 8.9 mg/dL (ref 8.4–10.5)
Chloride: 108 mmol/L (ref 96–112)
GFR calc Af Amer: 60 mL/min — ABNORMAL LOW (ref >90)
GFR calc non Af Amer: 52 mL/min — ABNORMAL LOW (ref >90)
Glucose, Bld: 134 mg/dL — ABNORMAL HIGH (ref 70–99)
POTASSIUM: 4.7 mmol/L (ref 3.5–5.1)
Sodium: 140 mmol/L (ref 135–145)
TOTAL PROTEIN: 6.8 g/dL (ref 6.0–8.3)
Total Bilirubin: 0.7 mg/dL (ref 0.3–1.2)

## 2014-04-29 LAB — EXPECTORATED SPUTUM ASSESSMENT W GRAM STAIN, RFLX TO RESP C

## 2014-04-29 LAB — LEGIONELLA ANTIGEN, URINE

## 2014-04-29 LAB — STREP PNEUMONIAE URINARY ANTIGEN: Strep Pneumo Urinary Antigen: NEGATIVE

## 2014-04-29 LAB — EXPECTORATED SPUTUM ASSESSMENT W REFEX TO RESP CULTURE

## 2014-04-29 LAB — HIV ANTIBODY (ROUTINE TESTING W REFLEX): HIV Screen 4th Generation wRfx: NONREACTIVE

## 2014-04-29 MED ORDER — SALINE SPRAY 0.65 % NA SOLN
1.0000 | NASAL | Status: DC | PRN
  Administered 2014-04-29: 1 via NASAL
  Filled 2014-04-29: qty 44

## 2014-04-29 MED ORDER — METHYLPREDNISOLONE SODIUM SUCC 125 MG IJ SOLR
60.0000 mg | Freq: Four times a day (QID) | INTRAMUSCULAR | Status: DC
  Administered 2014-04-29 – 2014-04-30 (×5): 60 mg via INTRAVENOUS
  Filled 2014-04-29 (×7): qty 0.96

## 2014-04-29 MED ORDER — GUAIFENESIN ER 600 MG PO TB12
600.0000 mg | ORAL_TABLET | Freq: Two times a day (BID) | ORAL | Status: DC | PRN
  Administered 2014-04-29 – 2014-05-01 (×4): 600 mg via ORAL
  Filled 2014-04-29 (×4): qty 1

## 2014-04-29 MED ORDER — IPRATROPIUM-ALBUTEROL 0.5-2.5 (3) MG/3ML IN SOLN
3.0000 mL | Freq: Four times a day (QID) | RESPIRATORY_TRACT | Status: DC
  Administered 2014-04-29 – 2014-04-30 (×4): 3 mL via RESPIRATORY_TRACT
  Filled 2014-04-29 (×4): qty 3

## 2014-04-29 MED ORDER — BOOST PLUS PO LIQD
237.0000 mL | Freq: Three times a day (TID) | ORAL | Status: DC
  Administered 2014-04-29 – 2014-05-02 (×7): 237 mL via ORAL
  Filled 2014-04-29 (×10): qty 237

## 2014-04-29 NOTE — Progress Notes (Signed)
[facility] PROGRESS NOTE  Nathaniel SauceDavid Leyba WJX:[id number] DOB: 01/04/1951 DOA: 04/28/2014 PCP: Doris CheadleADVANI, DEEPAK, MD   Assessment/Plan:  1. Community acquired pneumonia -continue Rocephin/zithromax,  -FU sputum Cx, blood Cx were sent from ER as well -Urine pneumo Ag negative -check FLU PCR  2. COPD exacerbation -continue IV solumedrol, cut down dose -Abx as above, nebs  3. Chronic sinusitis -supposed to have Sinus surgery per ENT Dr.Shoemaker in the near furture  4. CKD 2 -stable  DVT proph: HEp SQ  Code Status: Full Code Family Communication: [facility] Disposition Plan: home in 1-2days      Antibiotics:  Rocephin/Zithromax (indicate start date, and stop date if known)  HPI/Subjective: C/o cough, breathing ok  Objective: Filed Vitals:   04/29/14 0508  BP: 105/66  Pulse: 81  Temp: 97.5 F (36.4 C)  Resp: 18    Intake/Output Summary (Last 24 hours) at 04/29/14 1001 Last data filed at 04/29/14 0857  Gross per 24 hour  Intake    120 ml  Output    250 ml  Net   -130 ml   Filed Weights   04/28/14 2159  Weight: 74.2 kg (163 lb 9.3 oz)    Exam:   General:  AAOx3, no distress  Cardiovascular: S1S2/RRR  Respiratory: poor air movt, few scattered exp wheezes  Abdomen: soft, NT, bS present  Musculoskeletal: no edema c/c   Data Reviewed: Basic Metabolic Panel:  Recent Labs Lab 04/28/14 1829 04/29/14 0500  NA 138 140  K 4.1 4.7  CL 107 108  CO2 26 21  GLUCOSE 101* 134*  BUN 30* 30*  CREATININE 1.46* 1.39*  CALCIUM 9.1 8.9   Liver Function Tests:  Recent Labs Lab 04/29/14 0500  AST 26  ALT 20  ALKPHOS 62  BILITOT 0.7  PROT 6.8  ALBUMIN 3.4*   No results for input(s): LIPASE, AMYLASE in the last 168 hours. No results for input(s): AMMONIA in the last 168 hours. CBC:  Recent Labs Lab 04/28/14 1829 04/29/14 0500  WBC 5.8 3.4*  NEUTROABS 2.6 2.7  HGB 13.7 12.8*  HCT 41.0 38.4*  MCV 81.5 80.5  PLT 206  200   Cardiac Enzymes: No results for input(s): CKTOTAL, CKMB, CKMBINDEX, TROPONINI in the last 168 hours. BNP (last 3 results)  Recent Labs  04/28/14 1832  BNP 14.7    ProBNP (last 3 results) No results for input(s): PROBNP in the last 8760 hours.  CBG: No results for input(s): GLUCAP in the last 168 hours.  Recent Results (from the past 240 hour(s))  Culture, sputum-assessment     Status: None   Collection Time: 04/29/14  2:28 AM  Result Value Ref Range Status   Specimen Description SPUTUM  Final   Special Requests NONE  Final   Sputum evaluation   Final    THIS SPECIMEN IS ACCEPTABLE. RESPIRATORY CULTURE REPORT TO FOLLOW.   Report Status 04/29/2014 FINAL  Final     Studies: Dg Chest 2 View  04/28/2014   CLINICAL DATA:  Shortness of breath and cough for 1 week  EXAM: CHEST  2 VIEW  COMPARISON:  12/31/2012  FINDINGS: Cardiac shadow is stable. Increased density is noted along the right heart border consistent with right middle lobe infiltrate. No associated effusion is seen. No acute bony abnormality is noted.  IMPRESSION: Right middle lobe infiltrate. Followup films to resolution following appropriate therapy recommended.   Electronically Signed   By: Alcide CleverMark  Lukens M.D.   On: 04/28/2014  18:34    Scheduled Meds: . azithromycin  500 mg Intravenous Q24H  . cefTRIAXone (ROCEPHIN)  IV  1 g Intravenous Q24H  . heparin  5,000 Units Subcutaneous 3 times per day  . ipratropium-albuterol  3 mL Nebulization Q6H  . methylPREDNISolone (SOLU-MEDROL) injection  125 mg Intravenous Q12H   Continuous Infusions: . sodium chloride 100 mL/hr at 04/29/14 0202   Antibiotics Given (last 72 hours)    None      Active Problems:   Acute respiratory failure with hypoxia   CAP (community acquired pneumonia)    Time spent:    [facility]  [facility] Pager [phone]. If 7PM-7AM, please contact night-coverage at [email or web address], password [facility] 04/29/2014, 10:01 AM  LOS: 1  day

## 2014-04-29 NOTE — Progress Notes (Signed)
INITIAL NUTRITION ASSESSMENT  DOCUMENTATION CODES Per approved criteria  -Not Applicable   INTERVENTION: - Boost Plus TID - RD will continue to monitor  NUTRITION DIAGNOSIS: Inadequate oral intake related to poor appetite as evidenced by poor po.   Goal: Pt to meet >/= 90% of their estimated nutrition needs   Monitor:  Weight trend, po intake, acceptance of supplements, labs  Reason for Assessment: malnutrition screening tool  [age] y.o. male  Admitting Dx: <principal problem not specified>  ASSESSMENT: 64 y.o. year old male presenting with acute resp failure w/ hypoxia, CAP   - Pt with 25 lb wt loss in the past year (not significant for time frame). He reports a poor appetite prior to admission. Appetite has improved since hospital admission. He ate 100% of lunch today. Pt drinks Boost supplements at home twice daily to "gain weight." Will order nutritional supplements while in the hospital. - Pt with moderate muscle depletion of the temples and collarbone.  - Labs Reviewed  Height: Ht Readings from Last 1 Encounters:  04/28/14 6\' 2"  (1.88 m)    Weight: Wt Readings from Last 1 Encounters:  04/28/14 163 lb 9.3 oz (74.2 kg)    Ideal Body Weight: 82.2 kg  % Ideal Body Weight: 90%  Wt Readings from Last 10 Encounters:  04/28/14 163 lb 9.3 oz (74.2 kg)  02/15/14 165 lb (74.844 kg)  12/06/13 174 lb 9.6 oz (79.198 kg)  08/18/13 181 lb 3.2 oz (82.192 kg)  04/28/13 188 lb (85.276 kg)  01/05/13 179 lb 9.6 oz (81.466 kg)  12/31/12 185 lb 3 oz (84 kg)  12/07/12 185 lb (83.915 kg)  10/05/12 174 lb 3.2 oz (79.017 kg)  09/21/12 173 lb (78.472 kg)    Usual Body Weight: 188 lbs 1 year ago  % Usual Body Weight: 87%  BMI:  Body mass index is 20.99 kg/(m^2).  Estimated Nutritional Needs: Kcal: 1900-2100 Protein: 110-120 g Fluid: 2.0 L/day  Skin: intact  Diet Order: Diet Heart  EDUCATION NEEDS: -Education needs addressed   Intake/Output Summary (Last 24 hours)  at 04/29/14 1506 Last data filed at 04/29/14 1317  Gross per 24 hour  Intake    360 ml  Output    250 ml  Net    110 ml    Last BM: prior to admission   Labs:   Recent Labs Lab 04/28/14 1829 04/29/14 0500  NA 138 140  K 4.1 4.7  CL 107 108  CO2 26 21  BUN 30* 30*  CREATININE 1.46* 1.39*  CALCIUM 9.1 8.9  GLUCOSE 101* 134*    CBG (last 3)  No results for input(s): GLUCAP in the last 72 hours.  Scheduled Meds: . azithromycin  500 mg Intravenous Q24H  . cefTRIAXone (ROCEPHIN)  IV  1 g Intravenous Q24H  . heparin  5,000 Units Subcutaneous 3 times per day  . ipratropium-albuterol  3 mL Nebulization Q6H  . methylPREDNISolone (SOLU-MEDROL) injection  60 mg Intravenous Q6H    Continuous Infusions: . sodium chloride 100 mL/hr at 04/29/14 1321    Past Medical History  Diagnosis Date  . Bilateral pneumonia 07/31/2007  . Asthma   . Chronic cough   . Pansinusitis   . COPD (chronic obstructive pulmonary disease)   . Hypertension   . Shortness of breath   . Chronic kidney disease   . GERD (gastroesophageal reflux disease)     Past Surgical History  Procedure Laterality Date  . Left hand surgery  08/2006  Emmaline KluverHaley Montford Barg MS, RD, LDN [phone]

## 2014-04-30 DIAGNOSIS — J441 Chronic obstructive pulmonary disease with (acute) exacerbation: Principal | ICD-10-CM

## 2014-04-30 DIAGNOSIS — N183 Chronic kidney disease, stage 3 (moderate): Secondary | ICD-10-CM

## 2014-04-30 LAB — COMPREHENSIVE METABOLIC PANEL
ALT: 22 U/L (ref 0–53)
AST: 27 U/L (ref 0–37)
Albumin: 3.5 g/dL (ref 3.5–5.2)
Alkaline Phosphatase: 58 U/L (ref 39–117)
Anion gap: 10 (ref 5–15)
BILIRUBIN TOTAL: 0.5 mg/dL (ref 0.3–1.2)
BUN: 37 mg/dL — ABNORMAL HIGH (ref 6–23)
CALCIUM: 8.9 mg/dL (ref 8.4–10.5)
CO2: 22 mmol/L (ref 19–32)
Chloride: 111 mmol/L (ref 96–112)
Creatinine, Ser: 1.35 mg/dL (ref 0.50–1.35)
GFR, EST AFRICAN AMERICAN: 62 mL/min — AB (ref >90)
GFR, EST NON AFRICAN AMERICAN: 54 mL/min — AB (ref >90)
Glucose, Bld: 127 mg/dL — ABNORMAL HIGH (ref 70–99)
POTASSIUM: 4.5 mmol/L (ref 3.5–5.1)
Sodium: 143 mmol/L (ref 135–145)
Total Protein: 6.6 g/dL (ref 6.0–8.3)

## 2014-04-30 LAB — CBC WITH DIFFERENTIAL/PLATELET
Basophils Absolute: 0 10*3/uL (ref 0.0–0.1)
Basophils Relative: 0 % (ref 0–1)
EOS ABS: 0 10*3/uL (ref 0.0–0.7)
EOS PCT: 0 % (ref 0–5)
HCT: 39.2 % (ref 39.0–52.0)
Hemoglobin: 12.8 g/dL — ABNORMAL LOW (ref 13.0–17.0)
Lymphocytes Relative: 9 % — ABNORMAL LOW (ref 12–46)
Lymphs Abs: 0.8 10*3/uL (ref 0.7–4.0)
MCH: 26.5 pg (ref 26.0–34.0)
MCHC: 32.7 g/dL (ref 30.0–36.0)
MCV: 81.2 fL (ref 78.0–100.0)
Monocytes Absolute: 0.4 10*3/uL (ref 0.1–1.0)
Monocytes Relative: 4 % (ref 3–12)
Neutro Abs: 8 10*3/uL — ABNORMAL HIGH (ref 1.7–7.7)
Neutrophils Relative %: 87 % — ABNORMAL HIGH (ref 43–77)
Platelets: 208 10*3/uL (ref 150–400)
RBC: 4.83 MIL/uL (ref 4.22–5.81)
RDW: 15.1 % (ref 11.5–15.5)
WBC: 9.2 10*3/uL (ref 4.0–10.5)

## 2014-04-30 MED ORDER — METHYLPREDNISOLONE SODIUM SUCC 125 MG IJ SOLR
60.0000 mg | Freq: Two times a day (BID) | INTRAMUSCULAR | Status: DC
  Administered 2014-05-01 – 2014-05-02 (×3): 60 mg via INTRAVENOUS
  Filled 2014-04-30 (×5): qty 0.96

## 2014-04-30 MED ORDER — IPRATROPIUM-ALBUTEROL 0.5-2.5 (3) MG/3ML IN SOLN
3.0000 mL | RESPIRATORY_TRACT | Status: DC
  Administered 2014-04-30 – 2014-05-02 (×13): 3 mL via RESPIRATORY_TRACT
  Filled 2014-04-30 (×13): qty 3

## 2014-04-30 NOTE — Progress Notes (Addendum)
Patient ID: Nathaniel Hartman, male   DOB: Jul 02, 1950, 64 y.o.   MRN: [id number] [facility] PROGRESS NOTE  Nathaniel Hartman [id number] DOB: Jul 02, 1950 DOA: 04/28/2014 PCP: Doris CheadleADVANI, DEEPAK, MD  Brief narrative:    64 y.o. year old male with significant past medical history of COPD, asthma who [facility] with worsening shortness of breath, cough, wheezing for 2 weeks prior to this admission He was hypoxic on admission. CXR showed right middle lobe infiltrate. Pt is on azithromycin and rocephin.    Assessment/Plan:    Principal Problem: Acute respiratory failure with hypoxia / Community acquired pneumonia - hypoxia likely from pneumonia - will repeat CXR tomorrow am to follow up on resolution of pneumonia - influenza negative, strep pneumo and legionella negative - Blood cultures to date are negative. - Respiratory culture shows normal flora.  Active Problems: COPD exacerbation - Continue oxygen support via [place] to keep O2 saturation above 90% - continue duoneb scheduled and as needed - Continue solumedrol, taper down from Q 6 hours to Q 12 hours - Continue abx for PNA  Chronic kidney disease, stage 3 - baseline creatinine 1.5 - creatinine normalized on this admission    DVT Prophylaxis  - Heparin subQ ordered   Code Status: Full.  Family Communication:  plan of care discussed with the patient and his wife at the bedside    IV access:  Peripheral IV  Procedures and diagnostic studies:    Dg Chest 2 View 04/28/2014  Right middle lobe infiltrate. Followup films to resolution following appropriate therapy recommended.     Medical Consultants:  None   Other Consultants:  None   IAnti-Infectives:   Rocephin/Zithromax   Manson PasseyEVINE, ALMA, MD  [facility] Pager [phone]  If 7PM-7AM, please contact night-coverage [email or web address] Password [facility] 04/30/2014, 4:56 PM   LOS: 2 days    HPI/Subjective: No acute overnight events.  Objective: Filed Vitals:    04/30/14 0821 04/30/14 1100 04/30/14 1406 04/30/14 1508  BP:  122/77 133/88   Pulse:  86 99   Temp:  97.8 F (36.6 C) 97.4 F (36.3 C)   TempSrc:  Oral Oral   Resp:  22 20   Height:      Weight:      SpO2: 88% 93% 96% 93%    Intake/Output Summary (Last 24 hours) at 04/30/14 1656 Last data filed at 04/30/14 1400  Gross per 24 hour  Intake   4235 ml  Output   1302 ml  Net   2933 ml    Exam:   General:  Pt is alert, follows commands appropriately, not in acute distress  Cardiovascular: Regular rate and rhythm, S1/S2, no murmurs  Respiratory: wheezing in upper lung lobes, no crackles   Abdomen: Soft, non tender, non distended, bowel sounds present  Extremities: No edema, pulses DP and PT palpable bilaterally  Neuro: Grossly nonfocal  Data Reviewed: Basic Metabolic Panel:  Recent Labs Lab 04/28/14 1829 04/29/14 0500 04/30/14 0535  NA 138 140 143  K 4.1 4.7 4.5  CL 107 108 111  CO2 26 21 22   GLUCOSE 101* 134* 127*  BUN 30* 30* 37*  CREATININE 1.46* 1.39* 1.35  CALCIUM 9.1 8.9 8.9   Liver Function Tests:  Recent Labs Lab 04/29/14 0500 04/30/14 0535  AST 26 27  ALT 20 22  ALKPHOS 62 58  BILITOT 0.7 0.5  PROT 6.8 6.6  ALBUMIN 3.4* 3.5   No results for input(s): LIPASE, AMYLASE in the last 168 hours. No results  for input(s): AMMONIA in the last 168 hours. CBC:  Recent Labs Lab 04/28/14 1829 04/29/14 0500 04/30/14 0535  WBC 5.8 3.4* 9.2  NEUTROABS 2.6 2.7 8.0*  HGB 13.7 12.8* 12.8*  HCT 41.0 38.4* 39.2  MCV 81.5 80.5 81.2  PLT 206 200 208   Cardiac Enzymes: No results for input(s): CKTOTAL, CKMB, CKMBINDEX, TROPONINI in the last 168 hours. BNP: Invalid input(s): POCBNP CBG: No results for input(s): GLUCAP in the last 168 hours.  Recent Results (from the past 240 hour(s))  Culture, blood (routine x 2) Call MD if unable to obtain prior to antibiotics being given     Status: None (Preliminary result)   Collection Time: 04/28/14 10:45 PM   Result Value Ref Range Status   Specimen Description BLOOD RIGHT ARM  Final   Special Requests BOTTLES DRAWN AEROBIC AND ANAEROBIC 10CC  Final   Culture   Final           BLOOD CULTURE RECEIVED NO GROWTH TO DATE CULTURE WILL BE HELD FOR 5 DAYS BEFORE ISSUING A FINAL NEGATIVE REPORT Performed at [facility]    Report Status PENDING  Incomplete  Culture, blood (routine x 2) Call MD if unable to obtain prior to antibiotics being given     Status: None (Preliminary result)   Collection Time: 04/28/14 11:00 PM  Result Value Ref Range Status   Specimen Description BLOOD RIGHT ARM  Final   Special Requests BOTTLES DRAWN AEROBIC AND ANAEROBIC 10CC  Final   Culture   Final           BLOOD CULTURE RECEIVED NO GROWTH TO DATE CULTURE WILL BE HELD FOR 5 DAYS BEFORE ISSUING A FINAL NEGATIVE REPORT Performed at [facility]    Report Status PENDING  Incomplete  Culture, sputum-assessment     Status: None   Collection Time: 04/29/14  2:28 AM  Result Value Ref Range Status   Specimen Description SPUTUM  Final   Special Requests NONE  Final   Sputum evaluation   Final    THIS SPECIMEN IS ACCEPTABLE. RESPIRATORY CULTURE REPORT TO FOLLOW.   Report Status 04/29/2014 FINAL  Final  Culture, respiratory (NON-Expectorated)     Status: None (Preliminary result)   Collection Time: 04/29/14  2:28 AM  Result Value Ref Range Status   Specimen Description SPUTUM  Final   Special Requests NONE  Final   Gram Stain   Final    FEW WBC PRESENT, PREDOMINANTLY PMN FEW SQUAMOUS EPITHELIAL CELLS PRESENT MODERATE GRAM POSITIVE COCCI IN PAIRS IN CLUSTERS ABUNDANT GRAM NEGATIVE RODS FEW GRAM POSITIVE RODS    Culture   Final    NORMAL OROPHARYNGEAL FLORA Performed at [facility]    Report Status PENDING  Incomplete     Scheduled Meds: . azithromycin  500 mg Intravenous Q24H  . cefTRIAXone (ROCEPHIN)  IV  1 g Intravenous Q24H  . heparin  5,000 Units Subcutaneous 3 times per day  .  ipratropium-albuterol  3 mL Nebulization 6 times per day  . lactose free nutrition  237 mL Oral TID WC  . methylPREDNISolone (SOLU-MEDROL) injection  60 mg Intravenous Q6H   Continuous Infusions: . sodium chloride 100 mL (04/30/14 1500)

## 2014-05-01 LAB — CULTURE, RESPIRATORY W GRAM STAIN: Culture: NORMAL

## 2014-05-01 LAB — CBC WITH DIFFERENTIAL/PLATELET
Basophils Absolute: 0 10*3/uL (ref 0.0–0.1)
Basophils Relative: 0 % (ref 0–1)
EOS ABS: 0 10*3/uL (ref 0.0–0.7)
EOS PCT: 0 % (ref 0–5)
HCT: 37.4 % — ABNORMAL LOW (ref 39.0–52.0)
HEMOGLOBIN: 12.5 g/dL — AB (ref 13.0–17.0)
LYMPHS ABS: 1.2 10*3/uL (ref 0.7–4.0)
LYMPHS PCT: 14 % (ref 12–46)
MCH: 26.9 pg (ref 26.0–34.0)
MCHC: 33.4 g/dL (ref 30.0–36.0)
MCV: 80.6 fL (ref 78.0–100.0)
Monocytes Absolute: 0.6 10*3/uL (ref 0.1–1.0)
Monocytes Relative: 7 % (ref 3–12)
Neutro Abs: 7 10*3/uL (ref 1.7–7.7)
Neutrophils Relative %: 79 % — ABNORMAL HIGH (ref 43–77)
Platelets: 206 10*3/uL (ref 150–400)
RBC: 4.64 MIL/uL (ref 4.22–5.81)
RDW: 15.2 % (ref 11.5–15.5)
WBC: 8.9 10*3/uL (ref 4.0–10.5)

## 2014-05-01 LAB — COMPREHENSIVE METABOLIC PANEL
ALBUMIN: 3.3 g/dL — AB (ref 3.5–5.2)
ALT: 22 U/L (ref 0–53)
AST: 25 U/L (ref 0–37)
Alkaline Phosphatase: 55 U/L (ref 39–117)
Anion gap: 6 (ref 5–15)
BUN: 34 mg/dL — ABNORMAL HIGH (ref 6–23)
CO2: 25 mmol/L (ref 19–32)
Calcium: 8.6 mg/dL (ref 8.4–10.5)
Chloride: 111 mmol/L (ref 96–112)
Creatinine, Ser: 1.27 mg/dL (ref 0.50–1.35)
GFR calc Af Amer: 67 mL/min — ABNORMAL LOW (ref >90)
GFR, EST NON AFRICAN AMERICAN: 58 mL/min — AB (ref >90)
GLUCOSE: 101 mg/dL — AB (ref 70–99)
Potassium: 4.6 mmol/L (ref 3.5–5.1)
SODIUM: 142 mmol/L (ref 135–145)
TOTAL PROTEIN: 6.2 g/dL (ref 6.0–8.3)
Total Bilirubin: 0.4 mg/dL (ref 0.3–1.2)

## 2014-05-01 LAB — CULTURE, RESPIRATORY

## 2014-05-01 NOTE — Progress Notes (Signed)
Patient ID: Nathaniel Hartman, male   DOB: Aug 17, 1950, 64 y.o.   MRN: [id number] [facility] PROGRESS NOTE  Niranjan Rufener WUJ:[id number] DOB: 1950-03-15 DOA: 04/28/2014 PCP: Doris Cheadle, MD  Brief narrative:    64 y.o. year old male with significant past medical history of COPD, asthma who presented to [facility] ED with worsening shortness of breath, cough, wheezing for 2 weeks prior to this admission He was hypoxic on admission. CXR showed right middle lobe infiltrate. Pt is on azithromycin and rocephin.   Assessment/Plan:    Principal Problem: Acute respiratory failure with hypoxia / Community acquired pneumonia - Continue Zithromax and rocephin - influenza negative, strep pneumo and legionella negative - Blood cultures to date are negative. - Respiratory culture - normal flora.  Active Problems: COPD exacerbation - respiratory status stable - continue steroids since pt still wheezing  - continue current nebulizers   Chronic kidney disease, stage 3 - baseline creatinine 1.5 - creatinine now WNL   DVT Prophylaxis  - Heparin subQ ordered   Code Status: Full.  Family Communication:  plan of care discussed with the patient and his wife at the bedside  Disposition Plan: Home in next 24-48 hours.     IV access:  Peripheral IV  Procedures and diagnostic studies:    Dg Chest 2 View 04/28/2014  Right middle lobe infiltrate. Followup films to resolution following appropriate therapy recommended.     Medical Consultants:  None   Other Consultants:  None   IAnti-Infectives:   Rocephin/Zithromax     Manson Passey, MD  [facility] Pager [phone]  If 7PM-7AM, please contact night-coverage [email or web address] Password [facility] 05/01/2014, 5:42 PM   LOS: 3 days    HPI/Subjective: No acute overnight events.  Objective: Filed Vitals:   05/01/14 1225 05/01/14 1300 05/01/14 1458 05/01/14 1659  BP:   123/82   Pulse:   83   Temp:   97.6 F (36.4 C)   TempSrc:   Oral    Resp:   18   Height:      Weight:      SpO2: 93% 96% 93% 93%    Intake/Output Summary (Last 24 hours) at 05/01/14 1742 Last data filed at 05/01/14 1653  Gross per 24 hour  Intake   1600 ml  Output   1925 ml  Net   -325 ml    Exam:   General:  Pt is not in acute distress  Cardiovascular: RRR, S1/S2 (+)  Respiratory: still wheezing in upper lung lobes, no crackles, no rhonchi  Abdomen: non tender, non distended   Extremities: No swelling in legs, palpable pulses  Neuro: Nonfocal  Data Reviewed: Basic Metabolic Panel:  Recent Labs Lab 04/28/14 1829 04/29/14 0500 04/30/14 0535 05/01/14 0500  NA 138 140 143 142  K 4.1 4.7 4.5 4.6  CL 107 108 111 111  CO2 GLUCOSE 101* 134* 127* 101*  BUN 30* 30* 37* 34*  CREATININE 1.46* 1.39* 1.35 1.27  CALCIUM 9.1 8.9 8.9 8.6   Liver Function Tests:  Recent Labs Lab 04/29/14 0500 04/30/14 0535 05/01/14 0500  AST ALT ALKPHOS 62 58 55  BILITOT 0.7 0.5 0.4  PROT 6.8 6.6 6.2  ALBUMIN 3.4* 3.5 3.3*   No results for input(s): LIPASE, AMYLASE in the last 168 hours. No results for input(s): AMMONIA in the last 168 hours. CBC:  Recent Labs Lab 04/28/14 1829 04/29/14 0500 04/30/14 0535 05/01/14 0500  WBC  5.8 3.4* 9.2 8.9  NEUTROABS 2.6 2.7 8.0* 7.0  HGB 13.7 12.8* 12.8* 12.5*  HCT 41.0 38.4* 39.2 37.4*  MCV 81.5 80.5 81.2 80.6  PLT 206 200 208 206   Cardiac Enzymes: No results for input(s): CKTOTAL, CKMB, CKMBINDEX, TROPONINI in the last 168 hours. BNP: Invalid input(s): POCBNP CBG: No results for input(s): GLUCAP in the last 168 hours.  Recent Results (from the past 240 hour(s))  Culture, blood (routine x 2) Call MD if unable to obtain prior to antibiotics being given     Status: None (Preliminary result)   Collection Time: 04/28/14 10:45 PM  Result Value Ref Range Status   Specimen Description BLOOD RIGHT ARM  Final   Special Requests BOTTLES DRAWN AEROBIC AND ANAEROBIC  10CC  Final   Culture   Final           BLOOD CULTURE RECEIVED NO GROWTH TO DATE CULTURE WILL BE HELD FOR 5 DAYS BEFORE ISSUING A FINAL NEGATIVE REPORT [facility]    Report Status PENDING  Incomplete  Culture, blood (routine x 2) Call MD if unable to obtain prior to antibiotics being given     Status: None (Preliminary result)   Collection Time: 04/28/14 11:00 PM  Result Value Ref Range Status   Specimen Description BLOOD RIGHT ARM  Final   Special Requests BOTTLES DRAWN AEROBIC AND ANAEROBIC 10CC  Final   Culture   Final           BLOOD CULTURE RECEIVED NO GROWTH TO DATE CULTURE WILL BE HELD FOR 5 DAYS BEFORE ISSUING A FINAL NEGATIVE REPORT [facility]    Report Status PENDING  Incomplete  Culture, sputum-assessment     Status: None   Collection Time: 04/29/14  2:28 AM  Result Value Ref Range Status   Specimen Description SPUTUM  Final   Special Requests NONE  Final   Sputum evaluation   Final    THIS SPECIMEN IS ACCEPTABLE. RESPIRATORY CULTURE REPORT TO FOLLOW.   Report Status 04/29/2014 FINAL  Final  Culture, respiratory (NON-Expectorated)     Status: None   Collection Time: 04/29/14  2:28 AM  Result Value Ref Range Status   Specimen Description SPUTUM  Final   Special Requests NONE  Final   Gram Stain   Final    FEW WBC PRESENT, PREDOMINANTLY PMN FEW SQUAMOUS EPITHELIAL CELLS PRESENT MODERATE GRAM POSITIVE COCCI IN PAIRS IN CLUSTERS ABUNDANT GRAM NEGATIVE RODS FEW GRAM POSITIVE RODS    Culture   Final    NORMAL OROPHARYNGEAL FLORA [facility]formed [facility]    Report Status 05/01/2014 FINAL  Final     Scheduled Meds: . azithromycin  500 mg Intravenous Q24H  . cefTRIAXone (ROCEPHIN)  IV  1 g Intravenous Q24H  . heparin  5,000 Units Subcutaneous 3 times per day  . ipratropium-albuterol  3 mL Nebulization 6 times per day  . lactose free nutrition  237 mL Oral TID WC  . methylPREDNISolone (SOLU-MEDROL) injection  60  mg Intravenous Q12H   Continuous Infusions: . sodium chloride 100 mL/hr at 05/01/14 1550

## 2014-05-02 ENCOUNTER — Other Ambulatory Visit: Payer: Self-pay | Admitting: Otolaryngology

## 2014-05-02 DIAGNOSIS — N189 Chronic kidney disease, unspecified: Secondary | ICD-10-CM | POA: Insufficient documentation

## 2014-05-02 LAB — CBC WITH DIFFERENTIAL/PLATELET
BASOS ABS: 0 10*3/uL (ref 0.0–0.1)
Basophils Relative: 0 % (ref 0–1)
EOS ABS: 0 10*3/uL (ref 0.0–0.7)
EOS PCT: 0 % (ref 0–5)
HCT: 38.3 % — ABNORMAL LOW (ref 39.0–52.0)
Hemoglobin: 12.7 g/dL — ABNORMAL LOW (ref 13.0–17.0)
Lymphocytes Relative: 20 % (ref 12–46)
Lymphs Abs: 1.2 10*3/uL (ref 0.7–4.0)
MCH: 26.7 pg (ref 26.0–34.0)
MCHC: 33.2 g/dL (ref 30.0–36.0)
MCV: 80.5 fL (ref 78.0–100.0)
Monocytes Absolute: 0.4 10*3/uL (ref 0.1–1.0)
Monocytes Relative: 6 % (ref 3–12)
Neutro Abs: 4.5 10*3/uL (ref 1.7–7.7)
Neutrophils Relative %: 74 % (ref 43–77)
PLATELETS: 192 10*3/uL (ref 150–400)
RBC: 4.76 MIL/uL (ref 4.22–5.81)
RDW: 15.4 % (ref 11.5–15.5)
WBC: 6 10*3/uL (ref 4.0–10.5)

## 2014-05-02 LAB — COMPREHENSIVE METABOLIC PANEL
ALT: 27 U/L (ref 0–53)
AST: 28 U/L (ref 0–37)
Albumin: 3.1 g/dL — ABNORMAL LOW (ref 3.5–5.2)
Alkaline Phosphatase: 52 U/L (ref 39–117)
Anion gap: 7 (ref 5–15)
BILIRUBIN TOTAL: 0.6 mg/dL (ref 0.3–1.2)
BUN: 29 mg/dL — ABNORMAL HIGH (ref 6–23)
CALCIUM: 8.4 mg/dL (ref 8.4–10.5)
CHLORIDE: 109 mmol/L (ref 96–112)
CO2: 23 mmol/L (ref 19–32)
Creatinine, Ser: 1.22 mg/dL (ref 0.50–1.35)
GFR, EST AFRICAN AMERICAN: 70 mL/min — AB (ref >90)
GFR, EST NON AFRICAN AMERICAN: 61 mL/min — AB (ref >90)
GLUCOSE: 96 mg/dL (ref 70–99)
Potassium: 4.1 mmol/L (ref 3.5–5.1)
SODIUM: 139 mmol/L (ref 135–145)
Total Protein: 6 g/dL (ref 6.0–8.3)

## 2014-05-02 MED ORDER — AZITHROMYCIN 500 MG PO TABS
500.0000 mg | ORAL_TABLET | Freq: Every day | ORAL | Status: DC
  Filled 2014-05-02: qty 1

## 2014-05-02 MED ORDER — LEVOFLOXACIN 750 MG PO TABS: 750.0000 mg | ORAL_TABLET | Freq: Every day | ORAL | Status: DC

## 2014-05-02 MED ORDER — PREDNISONE 5 MG PO TABS: 50.0000 mg | ORAL_TABLET | Freq: Every day | ORAL | Status: DC

## 2014-05-02 NOTE — Discharge Summary (Signed)
Physician Discharge Summary  Nathaniel Hartman ZOX:[id number] DOB: 11/08/1950 DOA: 04/28/2014  PCP: Doris Cheadle, MD  Admit date: 04/28/2014 Discharge date: 05/02/2014  Recommendations for Outpatient Follow-up:  1. Please take Levaquin for 10 days on discharge for treatment of pneumonia. 2. Taper down prednisone starting from 50 mg a day, taper down by 5 mg a day down to 0 mg. for ex, today 50 mg, tomorrow 45 mg, then 40 mg the following day and etc...  Discharge Diagnoses:  Active Problems:   Acute respiratory failure with hypoxia   CAP (community acquired pneumonia)   COPD exacerbation    Discharge Condition: stable   Diet recommendation: as tolerated   History of present illness:  64 y.o. year old male with significant past medical history of COPD, asthma who presented to [facility] ED with worsening shortness of breath, cough, wheezing for 2 weeks prior to this admission He was hypoxic on admission. CXR showed right middle lobe infiltrate. Pt is on azithromycin and rocephin.   Assessment/Plan:    Principal Problem: Acute respiratory failure with hypoxia / Community acquired pneumonia - Patient was on azithromycin and Rocephin in hospital. Will change to Levaquin for 10 days on discharge. - influenza negative, strep pneumo and legionella negative - Blood cultures to date are negative. - Respiratory culture - normal flora. - Oxygen saturation 93% on room air.  Active Problems: COPD exacerbation - respiratory status stable - We'll continue prednisone taper as indicated above. - Continue albuterol inhaler, albuterol nebulizer, Symbicort as per home regimen  Chronic kidney disease, stage 3 - baseline creatinine 1.5 - creatinine now WNL   DVT Prophylaxis  - Heparin subQ ordered   Code Status: Full.  Family Communication: plan of care discussed with the patient and his wife at the bedside      IV access:  Peripheral IV  Procedures and diagnostic studies:    Dg Chest 2 View 04/28/2014 Right middle lobe infiltrate. Followup films to resolution following appropriate therapy recommended.   Medical Consultants:  None   Other Consultants:  None   IAnti-Infectives:   Rocephin/Zithromax in hospital     Signed:  Manson Passey, MD  [facility] 05/02/2014, 9:24 AM  Pager #: [phone]  Discharge Exam: Filed Vitals:   05/02/14 0859  BP: 121/84  Pulse: 104  Temp: 97.3 F (36.3 C)  Resp: 19   Filed Vitals:   05/02/14 0411 05/02/14 0602 05/02/14 0810 05/02/14 0859  BP:  135/90  121/84  Pulse:  88  104  Temp:  98.7 F (37.1 C)  97.3 F (36.3 C)  TempSrc:  Oral  Oral  Resp:  17  19  Height:      Weight:      SpO2: 92% 92% 89% 87%    General: Pt is alert, follows commands appropriately, not in acute distress Cardiovascular: Regular rate and rhythm, S1/S2 +, no murmurs Respiratory: Clear to auscultation bilaterally, no wheezing, no crackles, no rhonchi Abdominal: Soft, non tender, non distended, bowel sounds +, no guarding Extremities: no edema, no cyanosis, pulses palpable bilaterally DP and PT Neuro: Grossly nonfocal  Discharge Instructions  Discharge Instructions    Diet - low sodium heart healthy    Complete by:  As directed      Discharge instructions    Complete by:  As directed   1. Please take Levaquin for 10 days on discharge for treatment of pneumonia. 2. Taper down prednisone starting from 50 mg a day, taper down by 5 mg a day  down to 0 mg. for ex, today 50 mg, tomorrow 45 mg, then 40 mg the following day and etc...     Increase activity slowly    Complete by:  As directed             Medication List    STOP taking these medications        azithromycin 250 MG tablet  Commonly known as:  ZITHROMAX Z-PAK     benzonatate 100 MG capsule  Commonly known as:  TESSALON     losartan 50 MG tablet  Commonly known as:  COZAAR     methylPREDNISolone 4 MG tablet  Commonly known as:  MEDROL  DOSEPAK      TAKE these medications        albuterol 108 (90 BASE) MCG/ACT inhaler  Commonly known as:  PROVENTIL HFA;VENTOLIN HFA  Inhale 2 puffs into the lungs every 6 (six) hours as needed for wheezing.     albuterol (2.5 MG/3ML) 0.083% nebulizer solution  Commonly known as:  PROVENTIL  Take 2.5 mg by nebulization every 4 (four) hours as needed for wheezing or shortness of breath.     budesonide-formoterol 160-4.5 MCG/ACT inhaler  Commonly known as:  SYMBICORT  Take 2 puffs first thing in am and then another 2 puffs about 12 hours later.     fluticasone 50 MCG/ACT nasal spray  Commonly known as:  FLONASE  Place 2 sprays into both nostrils daily.     levofloxacin 750 MG tablet  Commonly known as:  LEVAQUIN  Take 1 tablet (750 mg total) by mouth daily.     PEPCID PO  Take 1 tablet by mouth at bedtime.     predniSONE 5 MG tablet  Commonly known as:  DELTASONE  Take 10 tablets (50 mg total) by mouth daily with breakfast.           Follow-up Information    Follow up with Doris Cheadle, MD. Schedule an appointment as soon as possible for a visit in 1 week.   Specialty:  Internal Medicine   Why:  Follow up appt after recent hospitalization   Contact information:   [address] [place] [address] [phone]        The results of significant diagnostics from this hospitalization (including imaging, microbiology, ancillary and laboratory) are listed below for reference.    Significant Diagnostic Studies: Dg Chest 2 View  04/28/2014   CLINICAL DATA:  Shortness of breath and cough for 1 week  EXAM: CHEST  2 VIEW  COMPARISON:  12/31/2012  FINDINGS: Cardiac shadow is stable. Increased density is noted along the right heart border consistent with right middle lobe infiltrate. No associated effusion is seen. No acute bony abnormality is noted.  IMPRESSION: Right middle lobe infiltrate. Followup films to resolution following appropriate therapy recommended.    Electronically Signed   By: Alcide Clever M.D.   On: 04/28/2014 18:34    Microbiology: Recent Results (from the past 240 hour(s))  Culture, blood (routine x 2) Call MD if unable to obtain prior to antibiotics being given     Status: None (Preliminary result)   Collection Time: 04/28/14 10:45 PM  Result Value Ref Range Status   Specimen Description BLOOD RIGHT ARM  Final   Special Requests BOTTLES DRAWN AEROBIC AND ANAEROBIC 10CC  Final   Culture   Final           BLOOD CULTURE RECEIVED NO GROWTH TO DATE CULTURE WILL BE HELD FOR  5 DAYS BEFORE ISSUING A FINAL NEGATIVE REPORT [facility]formed at [facility]    Report Status PENDING  Incomplete  Culture, blood (routine x 2) Call MD if unable to obtain prior to antibiotics being given     Status: None (Preliminary result)   Collection Time: 04/28/14 11:00 PM  Result Value Ref Range Status   Specimen Description BLOOD RIGHT ARM  Final   Special Requests BOTTLES DRAWN AEROBIC AND ANAEROBIC 10CC  Final   Culture   Final           BLOOD CULTURE RECEIVED NO GROWTH TO DATE CULTURE WILL BE HELD FOR 5 DAYS BEFORE ISSUING A FINAL NEGATIVE REPORT Performed at [facility]    Report Status PENDING  Incomplete  Culture, sputum-assessment     Status: None   Collection Time: 04/29/14  2:28 AM  Result Value Ref Range Status   Specimen Description SPUTUM  Final   Special Requests NONE  Final   Sputum evaluation   Final    THIS SPECIMEN IS ACCEPTABLE. RESPIRATORY CULTURE REPORT TO FOLLOW.   Report Status 04/29/2014 FINAL  Final  Culture, respiratory (NON-Expectorated)     Status: None   Collection Time: 04/29/14  2:28 AM  Result Value Ref Range Status   Specimen Description SPUTUM  Final   Special Requests NONE  Final   Gram Stain   Final    FEW WBC PRESENT, PREDOMINANTLY PMN FEW SQUAMOUS EPITHELIAL CELLS PRESENT MODERATE GRAM POSITIVE COCCI IN PAIRS IN CLUSTERS ABUNDANT GRAM NEGATIVE RODS FEW GRAM POSITIVE RODS    Culture   Final     NORMAL OROPHARYNGEAL FLORA [facility]formed at [facility]    Report Status 05/01/2014 FINAL  Final     Labs: Basic Metabolic Panel:  Recent Labs Lab 04/28/14 1829 04/29/14 0500 04/30/14 0535 05/01/14 0500 05/02/14 0500  NA 138 140 143 142 139  K 4.1 4.7 4.5 4.6 4.1  CL 107 108 111 111 109  CO2 26 21 22 25 23   GLUCOSE 101* 134* 127* 101* 96  BUN 30* 30* 37* 34* 29*  CREATININE 1.46* 1.39* 1.35 1.27 1.22  CALCIUM 9.1 8.9 8.9 8.6 8.4   Liver Function Tests:  Recent Labs Lab 04/29/14 0500 04/30/14 0535 05/01/14 0500 05/02/14 0500  AST 26 27 25 28   ALT 20 22 22 27   ALKPHOS 62 58 55 52  BILITOT 0.7 0.5 0.4 0.6  PROT 6.8 6.6 6.2 6.0  ALBUMIN 3.4* 3.5 3.3* 3.1*   No results for input(s): LIPASE, AMYLASE in the last 168 hours. No results for input(s): AMMONIA in the last 168 hours. CBC:  Recent Labs Lab 04/28/14 1829 04/29/14 0500 04/30/14 0535 05/01/14 0500 05/02/14 0500  WBC 5.8 3.4* 9.2 8.9 6.0  NEUTROABS 2.6 2.7 8.0* 7.0 4.5  HGB 13.7 12.8* 12.8* 12.5* 12.7*  HCT 41.0 38.4* 39.2 37.4* 38.3*  MCV 81.5 80.5 81.2 80.6 80.5  PLT 206 200 208 206 192   Cardiac Enzymes: No results for input(s): CKTOTAL, CKMB, CKMBINDEX, TROPONINI in the last 168 hours. BNP: BNP (last 3 results)  Recent Labs  04/28/14 1832  BNP 14.7    ProBNP (last 3 results) No results for input(s): PROBNP in the last 8760 hours.  CBG: No results for input(s): GLUCAP in the last 168 hours.  Time coordinating discharge: Over 30 minutes

## 2014-05-02 NOTE — Progress Notes (Signed)
Discharge summary sent to payer through [facility]  

## 2014-05-02 NOTE — Discharge Instructions (Signed)

## 2014-05-02 NOTE — Progress Notes (Signed)
PHARMACIST - PHYSICIAN COMMUNICATION DR:   Elisabeth Pigeonevine CONCERNING: Antibiotic IV to Oral Route Change Policy  RECOMMENDATION: This patient is receiving Azithromycin by the intravenous route.  Based on criteria approved by the Pharmacy and Therapeutics Committee, the antibiotic(s) is/are being converted to the equivalent oral dose form(s).   DESCRIPTION: These criteria include:  Patient being treated for a respiratory tract infection, urinary tract infection, cellulitis or clostridium difficile associated diarrhea if on metronidazole  The patient is not neutropenic and does not exhibit a GI malabsorption state  The patient is eating (either orally or via tube) and/or has been taking other orally administered medications for a least 24 hours  The patient is improving clinically and has a Tmax < 100.5  If you have questions about this conversion, please contact the Pharmacy Department  []   [phone]( [phone] )  [facility] []   [phone]( [phone] )  [facility]  []   [phone]( [phone] )  [facility] [x]   [phone]( [phone] )  [facility]   Thank you, Loralee PacasErin Keanna Tugwell, PharmD, BCPS 05/02/2014 7:47 AM

## 2014-05-03 ENCOUNTER — Other Ambulatory Visit (HOSPITAL_COMMUNITY): Payer: Self-pay

## 2014-05-03 ENCOUNTER — Other Ambulatory Visit: Payer: Self-pay

## 2014-05-03 ENCOUNTER — Emergency Department (HOSPITAL_COMMUNITY): Payer: Commercial Managed Care - HMO

## 2014-05-03 ENCOUNTER — Inpatient Hospital Stay (HOSPITAL_COMMUNITY): Admission: RE | Admit: 2014-05-03 | Payer: Commercial Managed Care - HMO | Source: Ambulatory Visit

## 2014-05-03 ENCOUNTER — Encounter (HOSPITAL_COMMUNITY): Payer: Self-pay | Admitting: Emergency Medicine

## 2014-05-03 ENCOUNTER — Inpatient Hospital Stay (HOSPITAL_COMMUNITY)
Admission: EM | Admit: 2014-05-03 | Discharge: 2014-05-06 | Disposition: A | Discharge status: Home / Self Care | Payer: Commercial Managed Care - HMO | Source: Home / Self Care | Attending: Internal Medicine | Admitting: Internal Medicine

## 2014-05-03 DIAGNOSIS — J449 Chronic obstructive pulmonary disease, unspecified: Secondary | ICD-10-CM

## 2014-05-03 DIAGNOSIS — K210 Gastro-esophageal reflux disease with esophagitis: Secondary | ICD-10-CM

## 2014-05-03 DIAGNOSIS — J189 Pneumonia, unspecified organism: Secondary | ICD-10-CM | POA: Diagnosis present

## 2014-05-03 DIAGNOSIS — Y95 Nosocomial condition: Secondary | ICD-10-CM

## 2014-05-03 DIAGNOSIS — J9601 Acute respiratory failure with hypoxia: Secondary | ICD-10-CM

## 2014-05-03 DIAGNOSIS — J441 Chronic obstructive pulmonary disease with (acute) exacerbation: Principal | ICD-10-CM

## 2014-05-03 DIAGNOSIS — K219 Gastro-esophageal reflux disease without esophagitis: Secondary | ICD-10-CM | POA: Diagnosis present

## 2014-05-03 DIAGNOSIS — J310 Chronic rhinitis: Secondary | ICD-10-CM

## 2014-05-03 DIAGNOSIS — R0602 Shortness of breath: Secondary | ICD-10-CM

## 2014-05-03 LAB — COMPREHENSIVE METABOLIC PANEL
ALBUMIN: 3.4 g/dL — AB (ref 3.5–5.2)
ALT: 29 U/L (ref 0–53)
ANION GAP: 7 (ref 5–15)
AST: 30 U/L (ref 0–37)
Alkaline Phosphatase: 62 U/L (ref 39–117)
BUN: 29 mg/dL — ABNORMAL HIGH (ref 6–23)
CO2: 26 mmol/L (ref 19–32)
CREATININE: 1.34 mg/dL (ref 0.50–1.35)
Calcium: 9.3 mg/dL (ref 8.4–10.5)
Chloride: 107 mmol/L (ref 96–112)
GFR calc Af Amer: 63 mL/min — ABNORMAL LOW (ref >90)
GFR calc non Af Amer: 54 mL/min — ABNORMAL LOW (ref >90)
Glucose, Bld: 78 mg/dL (ref 70–99)
POTASSIUM: 4.1 mmol/L (ref 3.5–5.1)
Sodium: 140 mmol/L (ref 135–145)
TOTAL PROTEIN: 6.8 g/dL (ref 6.0–8.3)
Total Bilirubin: 1 mg/dL (ref 0.3–1.2)

## 2014-05-03 LAB — BRAIN NATRIURETIC PEPTIDE: B Natriuretic Peptide: 83.1 pg/mL (ref 0.0–100.0)

## 2014-05-03 LAB — CBC WITH DIFFERENTIAL/PLATELET
Basophils Absolute: 0 10*3/uL (ref 0.0–0.1)
Basophils Relative: 0 % (ref 0–1)
Eosinophils Absolute: 0 10*3/uL (ref 0.0–0.7)
Eosinophils Relative: 0 % (ref 0–5)
HCT: 40.9 % (ref 39.0–52.0)
Hemoglobin: 14.1 g/dL (ref 13.0–17.0)
Lymphocytes Relative: 12 % (ref 12–46)
Lymphs Abs: 0.6 10*3/uL — ABNORMAL LOW (ref 0.7–4.0)
MCH: 27.1 pg (ref 26.0–34.0)
MCHC: 34.5 g/dL (ref 30.0–36.0)
MCV: 78.7 fL (ref 78.0–100.0)
MONOS PCT: 6 % (ref 3–12)
Monocytes Absolute: 0.3 10*3/uL (ref 0.1–1.0)
Neutro Abs: 4 10*3/uL (ref 1.7–7.7)
Neutrophils Relative %: 82 % — ABNORMAL HIGH (ref 43–77)
PLATELETS: 202 10*3/uL (ref 150–400)
RBC: 5.2 MIL/uL (ref 4.22–5.81)
RDW: 14.9 % (ref 11.5–15.5)
WBC: 4.8 10*3/uL (ref 4.0–10.5)

## 2014-05-03 LAB — I-STAT TROPONIN, ED: TROPONIN I, POC: 0.01 ng/mL (ref 0.00–0.08)

## 2014-05-03 LAB — I-STAT CG4 LACTIC ACID, ED: Lactic Acid, Venous: 1.05 mmol/L (ref 0.5–2.0)

## 2014-05-03 LAB — MRSA PCR SCREENING: MRSA BY PCR: NEGATIVE

## 2014-05-03 MED ORDER — CETYLPYRIDINIUM CHLORIDE 0.05 % MT LIQD
7.0000 mL | Freq: Two times a day (BID) | OROMUCOSAL | Status: DC
  Administered 2014-05-03 – 2014-05-06 (×6): 7 mL via OROMUCOSAL

## 2014-05-03 MED ORDER — VANCOMYCIN HCL IN DEXTROSE 1-5 GM/200ML-% IV SOLN
1000.0000 mg | Freq: Once | INTRAVENOUS | Status: AC
  Administered 2014-05-03: 1000 mg via INTRAVENOUS
  Filled 2014-05-03: qty 200

## 2014-05-03 MED ORDER — CEFTRIAXONE SODIUM IN DEXTROSE 20 MG/ML IV SOLN
1.0000 g | INTRAVENOUS | Status: DC
  Administered 2014-05-03 – 2014-05-04 (×2): 1 g via INTRAVENOUS
  Filled 2014-05-03 (×3): qty 50

## 2014-05-03 MED ORDER — METHYLPREDNISOLONE SODIUM SUCC 125 MG IJ SOLR
60.0000 mg | Freq: Three times a day (TID) | INTRAMUSCULAR | Status: DC
  Administered 2014-05-03 – 2014-05-04 (×4): 60 mg via INTRAVENOUS
  Filled 2014-05-03: qty 2
  Filled 2014-05-03: qty 0.96
  Filled 2014-05-03 (×2): qty 2
  Filled 2014-05-03 (×3): qty 0.96

## 2014-05-03 MED ORDER — ONDANSETRON HCL 4 MG/2ML IJ SOLN: 4.0000 mg | Freq: Four times a day (QID) | INTRAMUSCULAR | Status: DC | PRN

## 2014-05-03 MED ORDER — ALBUTEROL SULFATE (2.5 MG/3ML) 0.083% IN NEBU: 2.5000 mg | INHALATION_SOLUTION | RESPIRATORY_TRACT | Status: DC | PRN

## 2014-05-03 MED ORDER — FLUTICASONE PROPIONATE 50 MCG/ACT NA SUSP
2.0000 | Freq: Every day | NASAL | Status: DC
  Administered 2014-05-03 – 2014-05-06 (×4): 2 via NASAL
  Filled 2014-05-03: qty 16

## 2014-05-03 MED ORDER — OXYCODONE HCL 5 MG PO TABS: 5.0000 mg | ORAL_TABLET | ORAL | Status: DC | PRN

## 2014-05-03 MED ORDER — IPRATROPIUM-ALBUTEROL 0.5-2.5 (3) MG/3ML IN SOLN
3.0000 mL | RESPIRATORY_TRACT | Status: DC
  Administered 2014-05-03: 3 mL via RESPIRATORY_TRACT
  Filled 2014-05-03: qty 3

## 2014-05-03 MED ORDER — GUAIFENESIN-DM 100-10 MG/5ML PO SYRP: 5.0000 mL | ORAL_SOLUTION | ORAL | Status: DC | PRN

## 2014-05-03 MED ORDER — FAMOTIDINE 20 MG PO TABS
20.0000 mg | ORAL_TABLET | Freq: Every day | ORAL | Status: DC
  Administered 2014-05-03: 20 mg via ORAL
  Filled 2014-05-03 (×2): qty 1

## 2014-05-03 MED ORDER — ALBUTEROL (5 MG/ML) CONTINUOUS INHALATION SOLN
10.0000 mg/h | INHALATION_SOLUTION | RESPIRATORY_TRACT | Status: DC
  Administered 2014-05-03: 10 mg/h via RESPIRATORY_TRACT
  Filled 2014-05-03: qty 20

## 2014-05-03 MED ORDER — SODIUM CHLORIDE 0.9 % IV SOLN: INTRAVENOUS | Status: DC

## 2014-05-03 MED ORDER — IPRATROPIUM-ALBUTEROL 0.5-2.5 (3) MG/3ML IN SOLN: 3.0000 mL | RESPIRATORY_TRACT | Status: DC | PRN

## 2014-05-03 MED ORDER — DEXTROSE 5 % IV SOLN
500.0000 mg | INTRAVENOUS | Status: DC
  Administered 2014-05-03 – 2014-05-04 (×2): 500 mg via INTRAVENOUS
  Filled 2014-05-03 (×3): qty 500

## 2014-05-03 MED ORDER — ALUM & MAG HYDROXIDE-SIMETH 200-200-20 MG/5ML PO SUSP: 30.0000 mL | Freq: Four times a day (QID) | ORAL | Status: DC | PRN

## 2014-05-03 MED ORDER — SODIUM CHLORIDE 0.9 % IJ SOLN
3.0000 mL | Freq: Two times a day (BID) | INTRAMUSCULAR | Status: DC
  Administered 2014-05-03 – 2014-05-06 (×7): 3 mL via INTRAVENOUS

## 2014-05-03 MED ORDER — ENSURE ENLIVE PO LIQD
237.0000 mL | Freq: Two times a day (BID) | ORAL | Status: DC
  Administered 2014-05-03 – 2014-05-06 (×6): 237 mL via ORAL

## 2014-05-03 MED ORDER — POLYETHYLENE GLYCOL 3350 17 G PO PACK: 17.0000 g | PACK | Freq: Every day | ORAL | Status: DC | PRN

## 2014-05-03 MED ORDER — BUDESONIDE 0.25 MG/2ML IN SUSP
0.2500 mg | Freq: Two times a day (BID) | RESPIRATORY_TRACT | Status: DC
  Administered 2014-05-03: 0.25 mg via RESPIRATORY_TRACT
  Filled 2014-05-03 (×3): qty 2

## 2014-05-03 MED ORDER — ACETAMINOPHEN 325 MG PO TABS: 650.0000 mg | ORAL_TABLET | Freq: Four times a day (QID) | ORAL | Status: DC | PRN

## 2014-05-03 MED ORDER — BUDESONIDE-FORMOTEROL FUMARATE 160-4.5 MCG/ACT IN AERO
2.0000 | INHALATION_SPRAY | Freq: Two times a day (BID) | RESPIRATORY_TRACT | Status: DC
  Administered 2014-05-03 – 2014-05-04 (×2): 2 via RESPIRATORY_TRACT
  Filled 2014-05-03: qty 6

## 2014-05-03 MED ORDER — ALPRAZOLAM 0.25 MG PO TABS
0.2500 mg | ORAL_TABLET | Freq: Three times a day (TID) | ORAL | Status: DC | PRN
  Filled 2014-05-03: qty 1

## 2014-05-03 MED ORDER — ENOXAPARIN SODIUM 40 MG/0.4ML ~~LOC~~ SOLN
40.0000 mg | SUBCUTANEOUS | Status: DC
  Administered 2014-05-03 – 2014-05-06 (×4): 40 mg via SUBCUTANEOUS
  Filled 2014-05-03 (×4): qty 0.4

## 2014-05-03 MED ORDER — ONDANSETRON HCL 4 MG PO TABS: 4.0000 mg | ORAL_TABLET | Freq: Four times a day (QID) | ORAL | Status: DC | PRN

## 2014-05-03 MED ORDER — PIPERACILLIN-TAZOBACTAM 3.375 G IVPB 30 MIN
3.3750 g | Freq: Once | INTRAVENOUS | Status: AC
  Administered 2014-05-03: 3.375 g via INTRAVENOUS
  Filled 2014-05-03: qty 50

## 2014-05-03 MED ORDER — ACETAMINOPHEN 650 MG RE SUPP: 650.0000 mg | Freq: Four times a day (QID) | RECTAL | Status: DC | PRN

## 2014-05-03 MED ORDER — OXYMETAZOLINE HCL 0.05 % NA SOLN
1.0000 | Freq: Two times a day (BID) | NASAL | Status: AC
  Administered 2014-05-03 – 2014-05-05 (×6): 1 via NASAL
  Filled 2014-05-03: qty 15

## 2014-05-03 MED ORDER — LORATADINE 10 MG PO TABS
10.0000 mg | ORAL_TABLET | Freq: Every day | ORAL | Status: DC
  Administered 2014-05-03 – 2014-05-06 (×4): 10 mg via ORAL
  Filled 2014-05-03 (×4): qty 1

## 2014-05-03 NOTE — ED Notes (Signed)
Pt clo SOB/non-productive cough x 2 days after being d/c from hospital with PNA. EMS states pt was 84% on scene on RA, gave 125mg  solumedrol IV and duoneb tx en route. Pt a/o x 4 on arrival to ED.

## 2014-05-03 NOTE — Progress Notes (Addendum)
INITIAL NUTRITION ASSESSMENT  DOCUMENTATION CODES Per approved criteria  -Severe Malnutrition in the context of chronic illness   Pt meets criteria for severe MALNUTRITION in the context of chronic illness as evidenced by 19% wt loss x 3 months, and severe fat and muscle depletion.  INTERVENTION: Continue Ensure Enlive po BID, each supplement provides 350 kcal and 20 grams of protein  NUTRITION DIAGNOSIS: Malnutrition related to decreased oral intake as evidenced by severe fat and muscle depletion.   Goal: Pt will meet >90% of estimated nutritional needs  Monitor:  PO/supplement intake, labs, weight changes, I/O's  Reason for Assessment: MST=5  64 y.o. male  Admitting Dx: COPD exacerbation  Nathaniel Hartman is a 64 y.o. male with a Past Medical History of chronic sinusitis, COPD, gastroesophageal reflux disease who presents today with the above noted complaint. Patient was just discharged from the hospital yesterday and he was much improved and his initial presentation. He was still having some nasal congestion with sounds as if this is a chronic issue for the patient.  ASSESSMENT: Pt endorses decreased appetite and weight loss over the past 3 months. He reveals that he sometimes does not want to eat due to his nasal congestion. He reveals that he started to use the hot tub and steam room at the local gym in order to clear his sinuses with no relief.  Pt reveals he has lost 36# (19%) in the past 3 months (UBW# 185), which is confirmed by wt hx.  Spoke with RN who confirms wt loss PTA and that wife has altered her cooking methods, however, pt continues to lose weight. Pt reports he consumes Boost Plus BID. Pt has been drinking Ensure since being admitted and RN confirms that pt has been consuming supplement. Pt would like to continue Ensure supplement due to increase nutrient density to Boost Plus.  Pt consumed 100% of his lunch and breakfast today. Educated pt on importance of good meal  and supplement intake for healing. Also educated pt on importance of consuming calorie beverages in place of water to assist with weight gain (pt admits to drinking copious amount of water).  Labs reviewed. BUN: 29.   Nutrition Focused Physical Exam:  Subcutaneous Fat:  Orbital Region: severe depletion Upper Arm Region: severe depletion Thoracic and Lumbar Region: severe depletion  Muscle:  Temple Region: severe depletion Clavicle Bone Region: severe depletion Clavicle and Acromion Bone Region: severe depletion Scapular Bone Region: severe depletion Dorsal Hand: moderate depletion Patellar Region: moderate depletion Anterior Thigh Region: moderate depletion Posterior Calf Region: moderate depletion  Edema: none present  Height: Ht Readings from Last 1 Encounters:  05/03/14 6\' 2"  (1.88 m)    Weight: Wt Readings from Last 1 Encounters:  05/03/14 153 lb (69.4 kg)    Ideal Body Weight: 178#  % Ideal Body Weight: 86%  Wt Readings from Last 10 Encounters:  05/03/14 153 lb (69.4 kg)  02/15/14 165 lb (74.844 kg)  12/06/13 174 lb 9.6 oz (79.198 kg)  08/18/13 181 lb 3.2 oz (82.192 kg)  04/28/13 188 lb (85.276 kg)  01/05/13 179 lb 9.6 oz (81.466 kg)  12/31/12 185 lb 3 oz (84 kg)  12/07/12 185 lb (83.915 kg)  10/05/12 174 lb 3.2 oz (79.017 kg)  09/21/12 173 lb (78.472 kg)    Usual Body Weight: 185#  % Usual Body Weight: 83%  BMI:  Body mass index is 19.64 kg/(m^2). Normal weight changes  Estimated Nutritional Needs: Kcal: 2000-2200 Protein: 85-95 grams Fluid: 2.0-2.2 L  Skin: WDL  Diet Order: Diet Heart  EDUCATION NEEDS: -Education needs addressed   Intake/Output Summary (Last 24 hours) at 05/03/14 1415 Last data filed at 05/03/14 1400  Gross per 24 hour  Intake 597.67 ml  Output    380 ml  Net 217.67 ml    Last BM: PTA  Labs:   Recent Labs Lab 05/01/14 0500 05/02/14 0500 05/03/14 0403  NA 142 139 140  K 4.6 4.1 4.1  CL 111 109 107  CO2 BUN 34* 29* 29*  CREATININE 1.27 1.22 1.34  CALCIUM 8.6 8.4 9.3  GLUCOSE 101* 96 78    CBG (last 3)  No results for input(s): GLUCAP in the last 72 hours.  Scheduled Meds: . sodium chloride   Intravenous STAT  . antiseptic oral rinse  7 mL Mouth Rinse BID  . azithromycin  500 mg Intravenous Q24H  . budesonide-formoterol  2 puff Inhalation BID  . cefTRIAXone (ROCEPHIN)  IV  1 g Intravenous Q24H  . enoxaparin (LOVENOX) injection  40 mg Subcutaneous Q24H  . famotidine  20 mg Oral QHS  . feeding supplement (ENSURE ENLIVE)  237 mL Oral BID BM  . fluticasone  2 spray Each Nare Daily  . loratadine  10 mg Oral Daily  . methylPREDNISolone (SOLU-MEDROL) injection  60 mg Intravenous 3 times per day  . oxymetazoline  1 spray Each Nare BID  . sodium chloride  3 mL Intravenous Q12H    Continuous Infusions: . sodium chloride 10 mL/hr at 05/03/14 0865    Past Medical History  Diagnosis Date  . Bilateral pneumonia 07/31/2007  . Asthma   . Chronic cough   . Pansinusitis   . COPD (chronic obstructive pulmonary disease)   . Hypertension   . Shortness of breath   . Chronic kidney disease   . GERD (gastroesophageal reflux disease)     Past Surgical History  Procedure Laterality Date  . Left hand surgery  08/2006    Jamya Starry A. Mayford Knife, RD, LDN, CDE Pager: [phone] After hours Pager: [phone]

## 2014-05-03 NOTE — ED Notes (Signed)
Attempted report 

## 2014-05-03 NOTE — Care Management Note (Signed)
    Page 1 of 1   05/03/2014     8:48:17 AM CARE MANAGEMENT NOTE 05/03/2014  Patient:  Nathaniel Hartman,Nathaniel Hartman   Account Number:  [id number]  Date Initiated:  05/03/2014  Documentation initiated by:  Junius CreamerWELL,DEBBIE  Subjective/Objective Assessment:   adm w pneumonia     Action/Plan:   lives alone, pcp dr Ayesha Rumpfdeepak Orpah Cobbadvani   Anticipated DC Date:     Anticipated DC Plan:  HOME W HOME HEALTH SERVICES         Choice offered to / List presented to:             Status of service:   Medicare Important Message given?   (If response is "NO", the following Medicare IM given date fields will be blank) Date Medicare IM given:   Medicare IM given by:   Date Additional Medicare IM given:   Additional Medicare IM given by:    Discharge Disposition:    Per UR Regulation:  Reviewed for med. necessity/level of care/duration of stay  If discussed at Long Length of Stay Meetings, dates discussed:    Comments:

## 2014-05-03 NOTE — ED Notes (Signed)
Attempted report, was told [facility] had 2 pts with same name so they would have to rearrange before the pt could come up.

## 2014-05-03 NOTE — Progress Notes (Signed)
Pt assessed by RT.  Pt meets criteria for PRN txs like he takes as his home regimen.  I spoke with Mr. Nathaniel Hartman and he is ok with his txs being PRN.  Pt encouraged to call if tx needed.

## 2014-05-03 NOTE — ED Provider Notes (Signed)
CSN: [id number]     Arrival date & time 05/03/14  4098 History  This chart was scribed for Loren Racer, MD by Richarda Overlie, ED Scribe. This patient was seen in room D30C/D30C and the patient's care was started 3:46 AM.    Chief Complaint  Patient presents with  . Shortness of Breath   The history is provided by the patient. No language interpreter was used.   HPI Comments: Nathaniel Hartman is a 64 y.o. male with a history of asthma, COPD, SOB and CKD who presents to the Emergency Department complaining of SOB that worsened yesterday at 10PM. Discharged from the hospital on 3/21 for community acquired pneumonia. Pt reports he has an associated productive cough and CP as well. He states he was dx with pneumonia 5 days ago and was discharged with levaquin. Pt reports no modifying or alleviating factors at this time.   Past Medical History  Diagnosis Date  . Bilateral pneumonia 07/31/2007  . Asthma   . Chronic cough   . Pansinusitis   . COPD (chronic obstructive pulmonary disease)   . Hypertension   . Shortness of breath   . Chronic kidney disease   . GERD (gastroesophageal reflux disease)    Past Surgical History  Procedure Laterality Date  . Left hand surgery  08/2006   No family history on file. History  Substance Use Topics  . Smoking status: Former Smoker -- 0.50 packs/day for 4 years    Quit date: 02/11/1997  . Smokeless tobacco: Not on file  . Alcohol Use: No    Review of Systems  Constitutional: Negative for fever and chills.  Respiratory: Positive for cough, shortness of breath and wheezing.   Cardiovascular: Positive for chest pain. Negative for palpitations.  Gastrointestinal: Negative for nausea, vomiting, abdominal pain and diarrhea.  Musculoskeletal: Negative for back pain, neck pain and neck stiffness.  Skin: Negative for rash and wound.  Neurological: Negative for dizziness, weakness, light-headedness, numbness and headaches.  All other systems reviewed and  are negative.   Allergies  Motrin and Nsaids  Home Medications   Prior to Admission medications   Medication Sig Start Date End Date Taking? Authorizing Provider  albuterol (PROVENTIL HFA;VENTOLIN HFA) 108 (90 BASE) MCG/ACT inhaler Inhale 2 puffs into the lungs every 6 (six) hours as needed for wheezing. 08/18/13  Yes Doris Cheadle, MD  albuterol (PROVENTIL) (2.5 MG/3ML) 0.083% nebulizer solution Take 2.5 mg by nebulization every 4 (four) hours as needed for wheezing or shortness of breath.   Yes Historical Provider, MD  budesonide-formoterol (SYMBICORT) 160-4.5 MCG/ACT inhaler Take 2 puffs first thing in am and then another 2 puffs about 12 hours later. 08/18/13  Yes Doris Cheadle, MD  Famotidine (PEPCID PO) Take 1 tablet by mouth at bedtime.    Yes Historical Provider, MD  fluticasone (FLONASE) 50 MCG/ACT nasal spray Place 2 sprays into both nostrils daily. 12/06/13  Yes Doris Cheadle, MD  levofloxacin (LEVAQUIN) 750 MG tablet Take 1 tablet (750 mg total) by mouth daily. 05/02/14  Yes Alison Murray, MD  predniSONE (DELTASONE) 5 MG tablet Take 10 tablets (50 mg total) by mouth daily with breakfast. 05/02/14  Yes Alison Murray, MD   BP 132/99 mmHg  Pulse 104  Temp(Src) 98 F (36.7 C) (Oral)  Resp 21  Ht  (1.88 m)  Wt 153 lb (69.4 kg)  BMI 19.64 kg/m2  SpO2 95% Physical Exam  Constitutional: He is oriented to person, place, and time. He appears well-developed and  well-nourished. No distress.  HENT:  Head: Normocephalic and atraumatic.  Mouth/Throat: Oropharynx is clear and moist.  Eyes: EOM are normal. Pupils are equal, round, and reactive to light. Right eye exhibits no discharge. Left eye exhibits no discharge.  Neck: Normal range of motion. Neck supple. No tracheal deviation present.  Cardiovascular: Normal rate and regular rhythm.   Pulmonary/Chest: Effort normal. No respiratory distress. He has wheezes. He has no rales. He exhibits no tenderness (Expiratory wheezing throughout).   Abdominal: Soft. Bowel sounds are normal. He exhibits no distension and no mass. There is no tenderness. There is no rebound and no guarding.  Musculoskeletal: Normal range of motion. He exhibits no edema or tenderness.  No calf swelling or tenderness.  Neurological: He is alert and oriented to person, place, and time.  Moves all extremities without deficit. Sensation is grossly intact.  Skin: Skin is warm and dry. No rash noted. No erythema.  Psychiatric: He has a normal mood and affect. His behavior is normal.  Nursing note and vitals reviewed.   ED Course  Procedures   DIAGNOSTIC STUDIES:     COORDINATION OF CARE: 3:51 AM Discussed treatment plan with pt at bedside and pt agreed to plan.   Labs Review Labs Reviewed  COMPREHENSIVE METABOLIC PANEL - Abnormal; Notable for the following:    BUN 29 (*)    Albumin 3.4 (*)    GFR calc non Af Amer 54 (*)    GFR calc Af Amer 63 (*)    All other components within normal limits  BRAIN NATRIURETIC PEPTIDE  CBC WITH DIFFERENTIAL/PLATELET  Rosezena SensorI-STAT TROPOININ, ED  I-STAT CG4 LACTIC ACID, ED    Imaging Review Dg Chest Port 1 View  05/03/2014   CLINICAL DATA:  Dyspnea, onset tonight  EXAM: PORTABLE CHEST - 1 VIEW  COMPARISON:  04/28/2014  FINDINGS: The right middle lobe consolidation persists. Mild streaky left base opacities also persist. Emphysematous changes are present, upper lobe predominant. Pulmonary vasculature is normal. No large effusions are evident.  IMPRESSION: Persistent consolidation in the right middle lobe superimposed on severe emphysematous changes.   Electronically Signed   By: Ellery Plunkaniel R Mitchell M.D.   On: 05/03/2014 04:23     EKG Interpretation None      MDM   Final diagnoses:  SOB (shortness of breath)  HAP (hospital-acquired pneumonia)    I personally performed the services described in this documentation, which was scribed in my presence. The recorded information has been reviewed and is accurate.  Pt  breathing much easier at this time. Decreased breath sounds R base. Requiring significant supplemental O2 to maintain saturations. Discussed with [facility] and will admit to stepdown bed. Broad spectrum abx started in ED for failed outpt treatment/HCAP.      Loren Raceravid Jedidiah Demartini, MD 05/03/14 [id number]

## 2014-05-03 NOTE — H&P (Addendum)
PATIENT DETAILS Name: Nathaniel SauceDavid Anello Age: 64 y.o. Sex: male Date of Birth: January 06, 1951 Admit Date: 05/03/2014 ONG:[id number]:ADVANI, Ayesha RumpfEEPAK, MD   CHIEF COMPLAINT:  Shortness of breath  HPI: Nathaniel Hartman is a 64 y.o. male with a Past Medical History of chronic sinusitis, COPD, gastroesophageal reflux disease who presents today with the above noted complaint. Patient was just discharged from the hospital yesterday and he was much improved and his initial presentation. He was still having some nasal congestion with sounds as if this is a chronic issue for the patient. Around 10 PM last night, he started developing shortness of breath that dramatically worsened. EMS was called, per ED note, patient was found to have a pulse ox of 84% on room air on arrival by EMS. Patient was given Solu-Medrol, nebulized bronchodilators and brought to the emergency room. He was provided with further supportive care, slowly improved. The hospitalist service was then asked to admit this patient for further evaluation and treatment During my evaluation, patient appeared very comfortable and not in any acute distress. On room air his O2 saturation was persistently above 90%. He claimed that he was doing well since discharge, and then around 10 PM started having shortness of breath. He claims that he feels that he has significant nasal and throat congestion and that is preventing him from taking a deep breath. He denies any fever. He still has a lingering cough. There is no history of chest pain, nausea, vomiting, diarrhea or abdominal pain   ALLERGIES:   Allergies  Allergen Reactions  . Motrin [Ibuprofen] Shortness Of Breath  . Nsaids Shortness Of Breath    PAST MEDICAL HISTORY: Past Medical History  Diagnosis Date  . Bilateral pneumonia 07/31/2007  . Asthma   . Chronic cough   . Pansinusitis   . COPD (chronic obstructive pulmonary disease)   . Hypertension   . Shortness of breath   . Chronic kidney disease     . GERD (gastroesophageal reflux disease)     PAST SURGICAL HISTORY: Past Surgical History  Procedure Laterality Date  . Left hand surgery  08/2006    MEDICATIONS AT HOME: Prior to Admission medications   Medication Sig Start Date End Date Taking? Authorizing Provider  albuterol (PROVENTIL HFA;VENTOLIN HFA) 108 (90 BASE) MCG/ACT inhaler Inhale 2 puffs into the lungs every 6 (six) hours as needed for wheezing. 08/18/13  Yes Doris Cheadleeepak Advani, MD  albuterol (PROVENTIL) (2.5 MG/3ML) 0.083% nebulizer solution Take 2.5 mg by nebulization every 4 (four) hours as needed for wheezing or shortness of breath.   Yes Historical Provider, MD  budesonide-formoterol (SYMBICORT) 160-4.5 MCG/ACT inhaler Take 2 puffs first thing in am and then another 2 puffs about 12 hours later. 08/18/13  Yes Doris Cheadleeepak Advani, MD  Famotidine (PEPCID PO) Take 1 tablet by mouth at bedtime.    Yes Historical Provider, MD  fluticasone (FLONASE) 50 MCG/ACT nasal spray Place 2 sprays into both nostrils daily. 12/06/13  Yes Doris Cheadleeepak Advani, MD  levofloxacin (LEVAQUIN) 750 MG tablet Take 1 tablet (750 mg total) by mouth daily. 05/02/14  Yes Alison MurrayAlma M Devine, MD  predniSONE (DELTASONE) 5 MG tablet Take 10 tablets (50 mg total) by mouth daily with breakfast. 05/02/14  Yes Alison MurrayAlma M Devine, MD    FAMILY HISTORY: No family history of CAD  SOCIAL HISTORY:  reports that he quit smoking about 17 years ago. He does not have any smokeless tobacco history on file. He reports that he does not drink alcohol  or use illicit drugs.  REVIEW OF SYSTEMS:  Constitutional:   No  weight loss, night sweats,  Fevers, chills, fatigue.  HEENT:    No headaches, Difficulty swallowing,Tooth/dental problems,Sore throat,   Cardio-vascular: No chest pain,  Orthopnea, PND, swelling in lower extremities, anasarca,  dizziness, palpitations  GI:  No heartburn, indigestion, abdominal pain, nausea, vomiting  Resp: No coughing up of blood.No chest wall deformity  Skin:   no rash or lesions.  GU:  no dysuria, change in color of urine, no urgency or frequency.  No flank pain.  Musculoskeletal: No joint pain or swelling.  No decreased range of motion.  No back pain.  Psych: No change in mood or affect. No depression or anxiety.  No memory loss.   PHYSICAL EXAM: Blood pressure 136/96, pulse 93, temperature 98 F (36.7 C), temperature source Oral, resp. rate 22, height  (1.88 m), weight 69.4 kg (153 lb), SpO2 100 %.  General appearance :Awake, alert, not in any distress. Speech Clear. Not toxic Looking HEENT: Atraumatic and Normocephalic, pupils equally reactive to light and accomodation Neck: supple, no JVD. No cervical lymphadenopathy.  Chest:Good air entry bilaterally, scattered rhonchi CVS: S1 S2 regular, no murmurs.  Abdomen: Bowel sounds present, Non tender and not distended with no gaurding, rigidity or rebound. Extremities: B/L Lower Ext shows no edema, both legs are warm to touch Neurology: Awake alert, and oriented X 3, CN II-XII intact, Non focal Skin:No Rash Wounds:N/A  LABS ON ADMISSION:   Recent Labs  05/02/14 0500 05/03/14 0403  NA 139 140  K 4.1 4.1  CL 109 107  CO2 23 26  GLUCOSE 96 78  BUN 29* 29*  CREATININE 1.22 1.34  CALCIUM 8.4 9.3    Recent Labs  05/02/14 0500 05/03/14 0403  AST 28 30  ALT 27 29  ALKPHOS 52 62  BILITOT 0.6 1.0  PROT 6.0 6.8  ALBUMIN 3.1* 3.4*   No results for input(s): LIPASE, AMYLASE in the last 72 hours.  Recent Labs  05/02/14 0500 05/03/14 0403  WBC 6.0 4.8  NEUTROABS 4.5 4.0  HGB 12.7* 14.1  HCT 38.3* 40.9  MCV 80.5 78.7  PLT 192 202   No results for input(s): CKTOTAL, CKMB, CKMBINDEX, TROPONINI in the last 72 hours. No results for input(s): DDIMER in the last 72 hours. Invalid input(s): POCBNP   RADIOLOGIC STUDIES ON ADMISSION: Dg Chest Port 1 View  05/03/2014   CLINICAL DATA:  Dyspnea, onset tonight  EXAM: PORTABLE CHEST - 1 VIEW  COMPARISON:  04/28/2014   FINDINGS: The right middle lobe consolidation persists. Mild streaky left base opacities also persist. Emphysematous changes are present, upper lobe predominant. Pulmonary vasculature is normal. No large effusions are evident.  IMPRESSION: Persistent consolidation in the right middle lobe superimposed on severe emphysematous changes.   Electronically Signed   By: Ellery Plunk M.D.   On: 05/03/2014 04:23     EKG: Independently reviewed. Normal sinus rhythm  ASSESSMENT AND PLAN: Present on Admission:  . Acute respiratory failure with hypoxia: Secondary to COPD exacerbation, likely provoked by ongoing rhinitis. Admit for now to stepdown for further close monitoring, start IV Solu-Medrol, scheduled neulized bronchodilators. Will be placed on empiric Rocephin and Zithromax for ongoing pneumonia-do not think that the pneumonia is any worse, do not think at this time that patient needs to be treated for healthcare associated pneumonia.   Marland Kitchen COPD exacerbation: Precipitating above and possibly the patient's main reason for presenting back to the hospital. He  complains of a lot of upper respiratory tract issues- namely nasal/sinus congestion. Will place on anti-sinus treatment with oxymetolazone nasal spray, Flonase and Claritin. Antibiotics and steroids as noted above. From reviewing ED notes, and from my examination it appears that the patient is significantly improved and his initial presentation.  . Pneumonia: Patient was just discharged from this hospital after being treated with levofloxacin for community acquired pneumonia. Upon reviewing the chest x-ray, right lower lobe infiltrate appears unchanged. Patient does not have fever. Suspect his primary reason for presenting back to the hospital as a COPD exacerbation. We will not treat this as healthcare associated pneumonia, rather will continue Rocephin and Zithromax for presumed community-acquired pneumonia.   . Chronic rhinitis: Anti-sinus therapy as  above   . G E R D: Continue Pepcid  Further plan will depend as patient's clinical course evolves and further radiologic and laboratory data become available. Patient will be monitored closely.   Above noted plan was discussed with patient, he was in agreement.   DVT Prophylaxis: Prophylactic Lovenox  Code Status: Full Code  Disposition Plan: Home in 1-2 days  Total time spent for admission equals 45 minutes.  [facility] Pager [phone]  If 7PM-7AM, please contact night-coverage [email or web address] Password [facility] 05/03/2014, 7:59 AM

## 2014-05-04 ENCOUNTER — Other Ambulatory Visit: Payer: Self-pay | Admitting: Otolaryngology

## 2014-05-04 LAB — BASIC METABOLIC PANEL
ANION GAP: 9 (ref 5–15)
BUN: 28 mg/dL — ABNORMAL HIGH (ref 6–23)
CALCIUM: 9 mg/dL (ref 8.4–10.5)
CO2: 24 mmol/L (ref 19–32)
Chloride: 104 mmol/L (ref 96–112)
Creatinine, Ser: 1.39 mg/dL — ABNORMAL HIGH (ref 0.50–1.35)
GFR calc non Af Amer: 52 mL/min — ABNORMAL LOW (ref >90)
GFR, EST AFRICAN AMERICAN: 60 mL/min — AB (ref >90)
Glucose, Bld: 106 mg/dL — ABNORMAL HIGH (ref 70–99)
Potassium: 4.3 mmol/L (ref 3.5–5.1)
SODIUM: 137 mmol/L (ref 135–145)

## 2014-05-04 LAB — CBC
HCT: 40.1 % (ref 39.0–52.0)
HEMOGLOBIN: 13.8 g/dL (ref 13.0–17.0)
MCH: 26.8 pg (ref 26.0–34.0)
MCHC: 34.4 g/dL (ref 30.0–36.0)
MCV: 78 fL (ref 78.0–100.0)
Platelets: 207 10*3/uL (ref 150–400)
RBC: 5.14 MIL/uL (ref 4.22–5.81)
RDW: 14.8 % (ref 11.5–15.5)
WBC: 4.8 10*3/uL (ref 4.0–10.5)

## 2014-05-04 MED ORDER — AZITHROMYCIN 250 MG PO TABS
500.0000 mg | ORAL_TABLET | Freq: Every day | ORAL | Status: DC
  Administered 2014-05-05 – 2014-05-06 (×2): 500 mg via ORAL
  Filled 2014-05-04 (×2): qty 2

## 2014-05-04 MED ORDER — IPRATROPIUM-ALBUTEROL 0.5-2.5 (3) MG/3ML IN SOLN
3.0000 mL | Freq: Four times a day (QID) | RESPIRATORY_TRACT | Status: DC
  Administered 2014-05-04 – 2014-05-06 (×8): 3 mL via RESPIRATORY_TRACT
  Filled 2014-05-04 (×9): qty 3

## 2014-05-04 MED ORDER — METHYLPREDNISOLONE SODIUM SUCC 125 MG IJ SOLR
60.0000 mg | Freq: Two times a day (BID) | INTRAMUSCULAR | Status: DC
  Administered 2014-05-04 – 2014-05-05 (×2): 60 mg via INTRAVENOUS
  Filled 2014-05-04: qty 2

## 2014-05-04 MED ORDER — FAMOTIDINE 20 MG PO TABS
20.0000 mg | ORAL_TABLET | Freq: Two times a day (BID) | ORAL | Status: DC
  Administered 2014-05-04 – 2014-05-06 (×4): 20 mg via ORAL
  Filled 2014-05-04 (×5): qty 1

## 2014-05-04 MED ORDER — DIPHENHYDRAMINE HCL 25 MG PO CAPS
25.0000 mg | ORAL_CAPSULE | Freq: Three times a day (TID) | ORAL | Status: DC
  Administered 2014-05-04 – 2014-05-06 (×6): 25 mg via ORAL
  Filled 2014-05-04 (×6): qty 1

## 2014-05-04 MED ORDER — BUDESONIDE 0.25 MG/2ML IN SUSP
0.2500 mg | Freq: Two times a day (BID) | RESPIRATORY_TRACT | Status: DC
  Administered 2014-05-04 – 2014-05-06 (×4): 0.25 mg via RESPIRATORY_TRACT
  Filled 2014-05-04 (×8): qty 2

## 2014-05-04 NOTE — Progress Notes (Signed)
[facility] Sherald BargeSpencer, Yenesis Even T

## 2014-05-04 NOTE — Progress Notes (Signed)
[place] TEAM 1 - Stepdown/ICU TEAM Progress Note  Janae SauceDavid Joslin ZOX:[id number] DOB: 03/10/1950 DOA: 05/03/2014 PCP: Doris CheadleADVANI, DEEPAK, MD  Admit HPI / Brief Narrative: 64 y.o. male with a history of chronic sinusitis, COPD, and GERD who presented with SOB. Patient was discharged from the hospital the day prior to this admit.  Around 10 PM the night of his d/c he started developing shortness of breath that dramatically worsened. EMS was called, and the patient was found to have a pulse ox of 84% on room air by EMS. Patient was given Solu-Medrol, nebulized bronchodilators and brought to the emergency room.   During the admit MD's evaluation, the patient appeared very comfortable and not in any acute distress. On room air his O2 saturation were persistently above 90%. He claimed that significant nasal and throat congestion was preventing him from taking a deep breath. He denied fever.   HPI/Subjective: Pt states he feels "about the same."  He feels his wheezing is "in my neck", but he denies feeling that his throat is closing or difficulty swallowing.  Denies cp, n/v, or abdom pain.    Assessment/Plan:  Acute respiratory failure with hypoxia - ?due to COPD + PNA v/s upper airway issues  Continues to require 2-3LPM [place] O2 to keep sats >90% - wheeze does indeed sound worrisome for upper airway source - pt tells me he is "due to have surgery on the 29th" but can't tell me what kind or even where - I called [facility] who confirmed he was seen in the office on 04/20/2014 w/ no need/plan for f/u - I have requested records from that visit - cont nebs and steroids for now and follow clinically - max antihistamine   Recent RML CAP Was still on abx at time of return to hospital - no evidence of progression/worsening - this appears more related to need for home O2 in setting of infection on background of severe COPD   Chronic rhinitis / Chronic sinusitis Has been seen in ENT office as noted above - CT  maxillofacial Jan of this year - await office notes from ENT  GERD  Severe Malnutrition in the context of chronic illness ?if pt has an esophageal issue - if airway w/u and tx is not helpful, will consider esophageal exam (UGI, etc)  HTN BP currently reasonably controlled  Renal insufficieny Hydrate and follow   Code Status: FULL Family Communication: no family present at time of exam Disposition Plan: transfer to medical bed - attempt to wean O2 - assess O2 sats w/ ambulation and at rest to determine if home O2 required - obtain records regarding outpt ENT evaluation    Consultants: none  Procedures: none  Antibiotics: Azithromycin 3/22 >  Rocephin 3/22 >  DVT prophylaxis: lovenox   Objective: Blood pressure 132/98, pulse 91, temperature 97.8 F (36.6 C), temperature source Oral, resp. rate 19, height 6\' 2"  (1.88 m), weight 69.4 kg (153 lb), SpO2 92 %.  Intake/Output Summary (Last 24 hours) at 05/04/14 0947 Last data filed at 05/04/14 0800  Gross per 24 hour  Intake 1560.67 ml  Output   1600 ml  Net -39.33 ml   Exam: General: No acute respiratory distress at rest  Lungs: wheezing heard, but appears to be upper airway in source - moving good air th/o all lung fields - respirations are comfortable Cardiovascular: Regular rate and rhythm without murmur gallop or rub normal S1 and S2 Abdomen: Nontender, nondistended, soft, bowel sounds positive, no rebound, no ascites,  no appreciable mass Extremities: No significant cyanosis, clubbing, or edema bilateral lower extremities  Data Reviewed: Basic Metabolic Panel:  Recent Labs Lab 04/30/14 0535 05/01/14 0500 05/02/14 0500 05/03/14 0403 05/04/14 0242  NA 143 142 139 140 137  K 4.5 4.6 4.1 4.1 4.3  CL 111 111 109 107 104  CO2 GLUCOSE 127* 101* 96 78 106*  BUN 37* 34* 29* 29* 28*  CREATININE 1.35 1.27 1.22 1.34 1.39*  CALCIUM 8.9 8.6 8.4 9.3 9.0    Liver Function Tests:  Recent Labs Lab  04/29/14 0500 04/30/14 0535 05/01/14 0500 05/02/14 0500 05/03/14 0403  AST ALT ALKPHOS 62 58 55 52 62  BILITOT 0.7 0.5 0.4 0.6 1.0  PROT 6.8 6.6 6.2 6.0 6.8  ALBUMIN 3.4* 3.5 3.3* 3.1* 3.4*   CBC:  Recent Labs Lab 04/29/14 0500 04/30/14 0535 05/01/14 0500 05/02/14 0500 05/03/14 0403 05/04/14 0242  WBC 3.4* 9.2 8.9 6.0 4.8 4.8  NEUTROABS 2.7 8.0* 7.0 4.5 4.0  --   HGB 12.8* 12.8* 12.5* 12.7* 14.1 13.8  HCT 38.4* 39.2 37.4* 38.3* 40.9 40.1  MCV 80.5 81.2 80.6 80.5 78.7 78.0  PLT 200 208 206 192 202 207    Recent Results (from the past 240 hour(s))  Culture, blood (routine x 2) Call MD if unable to obtain prior to antibiotics being given     Status: None (Preliminary result)   Collection Time: 04/28/14 10:45 PM  Result Value Ref Range Status   Specimen Description BLOOD RIGHT ARM  Final   Special Requests BOTTLES DRAWN AEROBIC AND ANAEROBIC 10CC  Final   Culture   Final           BLOOD CULTURE RECEIVED NO GROWTH TO DATE CULTURE WILL BE HELD FOR 5 DAYS BEFORE ISSUING A FINAL NEGATIVE REPORT Performed at [facility]    Report Status PENDING  Incomplete  Culture, blood (routine x 2) Call MD if unable to obtain prior to antibiotics being given     Status: None (Preliminary result)   Collection Time: 04/28/14 11:00 PM  Result Value Ref Range Status   Specimen Description BLOOD RIGHT ARM  Final   Special Requests BOTTLES DRAWN AEROBIC AND ANAEROBIC 10CC  Final   Culture   Final           BLOOD CULTURE RECEIVED NO GROWTH TO DATE CULTURE WILL BE HELD FOR 5 DAYS BEFORE ISSUING A FINAL NEGATIVE REPORT Performed at [facility]    Report Status PENDING  Incomplete  Culture, sputum-assessment     Status: None   Collection Time: 04/29/14  2:28 AM  Result Value Ref Range Status   Specimen Description SPUTUM  Final   Special Requests NONE  Final   Sputum evaluation   Final    THIS SPECIMEN IS ACCEPTABLE. RESPIRATORY CULTURE  REPORT TO FOLLOW.   Report Status 04/29/2014 FINAL  Final  Culture, respiratory (NON-Expectorated)     Status: None   Collection Time: 04/29/14  2:28 AM  Result Value Ref Range Status   Specimen Description SPUTUM  Final   Special Requests NONE  Final   Gram Stain   Final    FEW WBC PRESENT, PREDOMINANTLY PMN FEW SQUAMOUS EPITHELIAL CELLS PRESENT MODERATE GRAM POSITIVE COCCI IN PAIRS IN CLUSTERS ABUNDANT GRAM NEGATIVE RODS FEW GRAM POSITIVE RODS    Culture   Final    NORMAL OROPHARYNGEAL FLORA Performed  at [facility]    Report Status 05/01/2014 FINAL  Final  MRSA PCR Screening     Status: None   Collection Time: 05/03/14  8:13 AM  Result Value Ref Range Status   MRSA by PCR NEGATIVE NEGATIVE Final    Comment:        The GeneXpert MRSA Assay (FDA approved for NASAL specimens only), is one component of a comprehensive MRSA colonization surveillance program. It is not intended to diagnose MRSA infection nor to guide or monitor treatment for MRSA infections.      Studies:  Recent x-ray studies have been reviewed in detail by the Attending Physician  Scheduled Meds:  Scheduled Meds: . antiseptic oral rinse  7 mL Mouth Rinse BID  . azithromycin  500 mg Intravenous Q24H  . budesonide-formoterol  2 puff Inhalation BID  . cefTRIAXone (ROCEPHIN)  IV  1 g Intravenous Q24H  . enoxaparin (LOVENOX) injection  40 mg Subcutaneous Q24H  . famotidine  20 mg Oral QHS  . feeding supplement (ENSURE ENLIVE)  237 mL Oral BID BM  . fluticasone  2 spray Each Nare Daily  . loratadine  10 mg Oral Daily  . methylPREDNISolone (SOLU-MEDROL) injection  60 mg Intravenous 3 times per day  . oxymetazoline  1 spray Each Nare BID  . sodium chloride  3 mL Intravenous Q12H    Time spent on care of this patient: 35 mins   Yisell Sprunger T , MD   [facility] Office  [phone] Pager - Text Page per Loretha Stapler as per below:  On-Call/Text Page:      [email or web address]      password  [email or web address]  If 7PM-7AM, please contact night-coverage [email or web address] Password [facility] 05/04/2014, 9:47 AM   LOS: 1 day

## 2014-05-05 DIAGNOSIS — J441 Chronic obstructive pulmonary disease with (acute) exacerbation: Secondary | ICD-10-CM

## 2014-05-05 DIAGNOSIS — J189 Pneumonia, unspecified organism: Secondary | ICD-10-CM

## 2014-05-05 DIAGNOSIS — R0602 Shortness of breath: Secondary | ICD-10-CM

## 2014-05-05 LAB — CBC
HEMATOCRIT: 38.6 % — AB (ref 39.0–52.0)
Hemoglobin: 13.4 g/dL (ref 13.0–17.0)
MCH: 27.7 pg (ref 26.0–34.0)
MCHC: 34.7 g/dL (ref 30.0–36.0)
MCV: 79.9 fL (ref 78.0–100.0)
Platelets: 209 10*3/uL (ref 150–400)
RBC: 4.83 MIL/uL (ref 4.22–5.81)
RDW: 15.1 % (ref 11.5–15.5)
WBC: 6.9 10*3/uL (ref 4.0–10.5)

## 2014-05-05 LAB — COMPREHENSIVE METABOLIC PANEL
ALT: 27 U/L (ref 0–53)
ANION GAP: 5 (ref 5–15)
AST: 17 U/L (ref 0–37)
Albumin: 2.8 g/dL — ABNORMAL LOW (ref 3.5–5.2)
Alkaline Phosphatase: 58 U/L (ref 39–117)
BUN: 39 mg/dL — ABNORMAL HIGH (ref 6–23)
CALCIUM: 8.5 mg/dL (ref 8.4–10.5)
CO2: 26 mmol/L (ref 19–32)
CREATININE: 1.46 mg/dL — AB (ref 0.50–1.35)
Chloride: 106 mmol/L (ref 96–112)
GFR, EST AFRICAN AMERICAN: 56 mL/min — AB (ref >90)
GFR, EST NON AFRICAN AMERICAN: 49 mL/min — AB (ref >90)
GLUCOSE: 94 mg/dL (ref 70–99)
Potassium: 4.3 mmol/L (ref 3.5–5.1)
Sodium: 137 mmol/L (ref 135–145)
Total Bilirubin: 0.6 mg/dL (ref 0.3–1.2)
Total Protein: 5.7 g/dL — ABNORMAL LOW (ref 6.0–8.3)

## 2014-05-05 LAB — CULTURE, BLOOD (ROUTINE X 2)
CULTURE: NO GROWTH
Culture: NO GROWTH

## 2014-05-05 MED ORDER — CEFPODOXIME PROXETIL 200 MG PO TABS
200.0000 mg | ORAL_TABLET | Freq: Two times a day (BID) | ORAL | Status: DC
  Administered 2014-05-05 – 2014-05-06 (×3): 200 mg via ORAL
  Filled 2014-05-05 (×4): qty 1

## 2014-05-05 MED ORDER — PREDNISONE 50 MG PO TABS
60.0000 mg | ORAL_TABLET | Freq: Every day | ORAL | Status: DC
  Administered 2014-05-05 – 2014-05-06 (×2): 60 mg via ORAL
  Filled 2014-05-05 (×4): qty 1

## 2014-05-05 NOTE — Progress Notes (Signed)
Progress Note  Janae SauceDavid Gerwig [id number] DOB: 03/07/1950 DOA: 05/03/2014  PCP: Doris CheadleADVANI, DEEPAK, MD  Brief Narrative: 64 y.o. male with a history of chronic sinusitis, COPD, and GERD who presented with SOB. Patient was discharged from the hospital the day prior to this admit.  Around 10 PM the night of his d/c he started developing shortness of breath that dramatically worsened. EMS was called, and the patient was found to have a pulse ox of 84% on room air by EMS. Patient was given Solu-Medrol, nebulized bronchodilators and brought to the emergency room. He was admitted for further management.  Subjective: Patient feels overall much better. He still complains of sinus drainage and as a result has to clear his throat quite often. He also occasionally feels short of breath due to the same. Denies any other complaints. Has been ambulating without difficulty.   Assessment/Plan:  Acute respiratory failure with hypoxia - ?due to COPD + PNA v/s upper airway issues  Currently he is off of oxygen. He is saturating normal on room air. His lungs were much improved to examination. Changes steroids to oral. Change antibiotics to oral. Notes from his ENT visit are reviewed. There was mention of chronic sinusitis. Patient tells me that there is plan for surgical intervention in the near future, though the note did not mention the same. We will let him follow-up with his ENT specialist to determine this as an outpatient. No stridor is noted.  Recent RML CAP Patient was still on abx at time of return to hospital. There was no evidence of progression/worsening. Patient was started on alternative antibiotics. Continue with azithromycin. We will change the ceftriaxone to oral Vantin.  Chronic rhinitis / Chronic sinusitis Has been seen in ENT office as noted above and underwent CT maxillofacial Jan of this year. Continue nasal spray and decongestants. Can follow-up with ENT specialists.  Severe Malnutrition in  the context of chronic illness Encourage oral intake. Denies any difficulty with swallowing.  Essential HTN BP currently reasonably controlled  Renal insufficieny Renal function appears to be close to baseline.  DVT prophylaxis: lovenox  Code Status: FULL Family Communication: Discussed in detail with the patient. Disposition Plan: Anticipate discharge tomorrow.   Consultants: none  Procedures: none  Antibiotics: Azithromycin 3/22 >  Rocephin 3/22 >3/24 Vantin 3/24-->   Objective: Blood pressure 138/103, pulse 78, temperature 97.8 F (36.6 C), temperature source Oral, resp. rate 18, height 6\' 2"  (1.88 m), weight 69.4 kg (153 lb), SpO2 98 %. No intake or output data in the 24 hours ending 05/05/14 0855 Exam: General: No acute respiratory distress Lungs: Few scattered wheezing. Reasonably good air entry. No crackles. No stridor.  Cardiovascular: Regular rate and rhythm without murmur gallop or rub normal S1 and S2 Abdomen: Nontender, nondistended, soft, bowel sounds positive, no rebound, no ascites, no appreciable mass Extremities: No significant cyanosis, clubbing, or edema bilateral lower extremities  Data Reviewed: Basic Metabolic Panel:  Recent Labs Lab 05/01/14 0500 05/02/14 0500 05/03/14 0403 05/04/14 0242 05/05/14 0450  NA 142 139 140 137 137  K 4.6 4.1 4.1 4.3 4.3  CL 111 109 107 104 106  CO2 25 23 26 24 26   GLUCOSE 101* 96 78 106* 94  BUN 34* 29* 29* 28* 39*  CREATININE 1.27 1.22 1.34 1.39* 1.46*  CALCIUM 8.6 8.4 9.3 9.0 8.5    Liver Function Tests:  Recent Labs Lab 04/30/14 0535 05/01/14 0500 05/02/14 0500 05/03/14 0403 05/05/14 0450  AST 27 25 28 30  17  ALT ALKPHOS 58 55 52 62 58  BILITOT 0.5 0.4 0.6 1.0 0.6  PROT 6.6 6.2 6.0 6.8 5.7*  ALBUMIN 3.5 3.3* 3.1* 3.4* 2.8*   CBC:  Recent Labs Lab 04/29/14 0500 04/30/14 0535 05/01/14 0500 05/02/14 0500 05/03/14 0403 05/04/14 0242 05/05/14 0450  WBC 3.4* 9.2 8.9 6.0  4.8 4.8 6.9  NEUTROABS 2.7 8.0* 7.0 4.5 4.0  --   --   HGB 12.8* 12.8* 12.5* 12.7* 14.1 13.8 13.4  HCT 38.4* 39.2 37.4* 38.3* 40.9 40.1 38.6*  MCV 80.5 81.2 80.6 80.5 78.7 78.0 79.9  PLT 200 208 206 192 202 207 209    Recent Results (from the past 240 hour(s))  Culture, blood (routine x 2) Call MD if unable to obtain prior to antibiotics being given     Status: None (Preliminary result)   Collection Time: 04/28/14 10:45 PM  Result Value Ref Range Status   Specimen Description BLOOD RIGHT ARM  Final   Special Requests BOTTLES DRAWN AEROBIC AND ANAEROBIC 10CC  Final   Culture   Final           BLOOD CULTURE RECEIVED NO GROWTH TO DATE CULTURE WILL BE HELD FOR 5 DAYS BEFORE ISSUING A FINAL NEGATIVE REPORT Performed at [facility]    Report Status PENDING  Incomplete  Culture, blood (routine x 2) Call MD if unable to obtain prior to antibiotics being given     Status: None (Preliminary result)   Collection Time: 04/28/14 11:00 PM  Result Value Ref Range Status   Specimen Description BLOOD RIGHT ARM  Final   Special Requests BOTTLES DRAWN AEROBIC AND ANAEROBIC 10CC  Final   Culture   Final           BLOOD CULTURE RECEIVED NO GROWTH TO DATE CULTURE WILL BE HELD FOR 5 DAYS BEFORE ISSUING A FINAL NEGATIVE REPORT Performed at [facility]    Report Status PENDING  Incomplete  Culture, sputum-assessment     Status: None   Collection Time: 04/29/14  2:28 AM  Result Value Ref Range Status   Specimen Description SPUTUM  Final   Special Requests NONE  Final   Sputum evaluation   Final    THIS SPECIMEN IS ACCEPTABLE. RESPIRATORY CULTURE REPORT TO FOLLOW.   Report Status 04/29/2014 FINAL  Final  Culture, respiratory (NON-Expectorated)     Status: None   Collection Time: 04/29/14  2:28 AM  Result Value Ref Range Status   Specimen Description SPUTUM  Final   Special Requests NONE  Final   Gram Stain   Final    FEW WBC PRESENT, PREDOMINANTLY PMN FEW SQUAMOUS EPITHELIAL CELLS  PRESENT MODERATE GRAM POSITIVE COCCI IN PAIRS IN CLUSTERS ABUNDANT GRAM NEGATIVE RODS FEW GRAM POSITIVE RODS    Culture   Final    NORMAL OROPHARYNGEAL FLORA Performed at [facility]    Report Status 05/01/2014 FINAL  Final  MRSA PCR Screening     Status: None   Collection Time: 05/03/14  8:13 AM  Result Value Ref Range Status   MRSA by PCR NEGATIVE NEGATIVE Final    Comment:        The GeneXpert MRSA Assay (FDA approved for NASAL specimens only), is one component of a comprehensive MRSA colonization surveillance program. It is not intended to diagnose MRSA infection nor to guide or monitor treatment for MRSA infections.      Scheduled Meds:  Scheduled Meds: . antiseptic oral rinse  7 mL Mouth Rinse BID  . azithromycin  500 mg Oral Daily  . budesonide (PULMICORT) nebulizer solution  0.25 mg Nebulization BID  . cefpodoxime  200 mg Oral Q12H  . diphenhydrAMINE  25 mg Oral 3 times per day  . enoxaparin (LOVENOX) injection  40 mg Subcutaneous Q24H  . famotidine  20 mg Oral BID  . feeding supplement (ENSURE ENLIVE)  237 mL Oral BID BM  . fluticasone  2 spray Each Nare Daily  . ipratropium-albuterol  3 mL Nebulization QID  . loratadine  10 mg Oral Daily  . oxymetazoline  1 spray Each Nare BID  . predniSONE  60 mg Oral Q breakfast  . sodium chloride  3 mL Intravenous Q12H    Time spent on care of this patient: 35 mins   Osvaldo Shipper , MD  Pager: [phone]  [facility] Office  [phone] Pager - Text Page per Loretha Stapler as per below:  On-Call/Text Page:      [email or web address]      password [email or web address]  If 7PM-7AM, please contact night-coverage [email or web address] Password [facility] 05/05/2014, 8:55 AM   LOS: 2 days

## 2014-05-05 NOTE — Progress Notes (Signed)
*  PRELIMINARY RESULTS* Vascular Ultrasound Lower extremity venous duplex has been completed.  Preliminary findings: No evidence of DVT.   Farrel DemarkJill Eunice, RDMS, RVT  05/05/2014, 1:01 PM

## 2014-05-06 MED ORDER — CEFPODOXIME PROXETIL 200 MG PO TABS: 200.0000 mg | ORAL_TABLET | Freq: Two times a day (BID) | ORAL | Status: DC

## 2014-05-06 MED ORDER — PREDNISONE 20 MG PO TABS: ORAL_TABLET | ORAL | Status: DC

## 2014-05-06 MED ORDER — AZITHROMYCIN 500 MG PO TABS: 500.0000 mg | ORAL_TABLET | Freq: Every day | ORAL | Status: DC

## 2014-05-06 MED ORDER — ALBUTEROL SULFATE (2.5 MG/3ML) 0.083% IN NEBU: 2.5000 mg | INHALATION_SOLUTION | RESPIRATORY_TRACT | Status: DC | PRN

## 2014-05-06 MED ORDER — LORATADINE 10 MG PO TABS: 10.0000 mg | ORAL_TABLET | Freq: Every day | ORAL | Status: DC

## 2014-05-06 MED ORDER — BUDESONIDE-FORMOTEROL FUMARATE 160-4.5 MCG/ACT IN AERO: INHALATION_SPRAY | RESPIRATORY_TRACT | Status: DC

## 2014-05-06 NOTE — Care Management (Signed)
05-06-14 IM from Medicare given . Wlliam Grosso RN BSN  

## 2014-05-06 NOTE — Discharge Summary (Signed)
[facility]  Physician Discharge Summary   Patient ID: Nathaniel Hartman MRN: [id number] DOB/AGE: 10/01/1950 64 y.o.  Admit date: 05/03/2014 Discharge date: 05/06/2014  PCP: Doris Cheadle, MD  DISCHARGE DIAGNOSES:  Principal Problem:   COPD exacerbation Active Problems:   G E R D   Chronic rhinitis   Acute respiratory failure with hypoxia   Pneumonia   RECOMMENDATIONS FOR OUTPATIENT FOLLOW UP: 1. Close follow-up with PCP. 2. Consider repeating chest x-ray in 4-6 weeks.  DISCHARGE CONDITION: fair  Diet recommendation: As before  Filed Weights   05/03/14 0350  Weight: 69.4 kg (153 lb)    INITIAL HISTORY:  Brief Narrative: 64 y.o. male with a history of chronic sinusitis, COPD, and GERD who presented with SOB. Patient was discharged from the hospital the day prior to this admit. Around 10 PM the night of his d/c he started developing shortness of breath that dramatically worsened. EMS was called, and the patient was found to have a pulse ox of 84% on room air by EMS. Patient was given Solu-Medrol, nebulized bronchodilators and brought to the emergency room. He was admitted for further management.   HOSPITAL COURSE:   Acute respiratory failure with hypoxia - ?due to COPD + PNA v/s upper airway issues  Patient rapidly improved. He was given nebulizer treatments. He was taken off of oxygen. He was saturating normal on room air. He was given systemic steroids, which were changed over to oral. This will be tapered down slowly. Was also started on intravenous antibiotics, which was also changed to oral. Reason for recurrent hospitalization due to similar symptoms isn't entirely clear. His sinusitis and rhinitis, probably contributed. There was no stridor. Patient is feeling much better. His ambulated in the hallway without any difficulties. He has an upcoming appointment with his ENT specialist. He should also continue with his inhaled steroids.  Recent RML CAP Patient was  still on abx at time of return to hospital. There was no evidence of progression/worsening. Patient was started on alternative antibiotics. He will be prescribed Vantin and azithromycin.   Chronic rhinitis / Chronic sinusitis Has been seen in ENT office as noted above and underwent CT maxillofacial Jan of this year. Continue nasal spray and decongestants. Can follow-up with ENT specialists. He states that his sinus congestion is better.  Severe Malnutrition in the context of chronic illness Encourage oral intake. Denies any difficulty with swallowing.  Essential HTN BP currently reasonably controlled  Renal insufficieny Renal function appears to be close to baseline.  Overall, patient is much better. Feels back to baseline. Okay for discharge.   PERTINENT LABS:  The results of significant diagnostics from this hospitalization (including imaging, microbiology, ancillary and laboratory) are listed below for reference.    Microbiology: Recent Results (from the past 240 hour(s))  Culture, blood (routine x 2) Call MD if unable to obtain prior to antibiotics being given     Status: None   Collection Time: 04/28/14 10:45 PM  Result Value Ref Range Status   Specimen Description BLOOD RIGHT ARM  Final   Special Requests BOTTLES DRAWN AEROBIC AND ANAEROBIC 10CC  Final   Culture   Final    NO GROWTH 5 DAYS Performed at [facility]    Report Status 05/05/2014 FINAL  Final  Culture, blood (routine x 2) Call MD if unable to obtain prior to antibiotics being given     Status: None   Collection Time: 04/28/14 11:00 PM  Result Value Ref Range Status  Specimen Description BLOOD RIGHT ARM  Final   Special Requests BOTTLES DRAWN AEROBIC AND ANAEROBIC 10CC  Final   Culture   Final    NO GROWTH 5 DAYS [facility]formed [facility]    Report Status 05/05/2014 FINAL  Final  Culture, sputum-assessment     Status: None   Collection Time: 04/29/14  2:28 AM  Result Value Ref Range  Status   Specimen Description SPUTUM  Final   Special Requests NONE  Final   Sputum evaluation   Final    THIS SPECIMEN IS ACCEPTABLE. RESPIRATORY CULTURE REPORT TO FOLLOW.   Report Status 04/29/2014 FINAL  Final  Culture, respiratory (NON-Expectorated)     Status: None   Collection Time: 04/29/14  2:28 AM  Result Value Ref Range Status   Specimen Description SPUTUM  Final   Special Requests NONE  Final   Gram Stain   Final    FEW WBC PRESENT, PREDOMINANTLY PMN FEW SQUAMOUS EPITHELIAL CELLS PRESENT MODERATE GRAM POSITIVE COCCI IN PAIRS IN CLUSTERS ABUNDANT GRAM NEGATIVE RODS FEW GRAM POSITIVE RODS    Culture   Final    NORMAL OROPHARYNGEAL FLORA Performed at [facility]    Report Status 05/01/2014 FINAL  Final  MRSA PCR Screening     Status: None   Collection Time: 05/03/14  8:13 AM  Result Value Ref Range Status   MRSA by PCR NEGATIVE NEGATIVE Final    Comment:        The GeneXpert MRSA Assay (FDA approved for NASAL specimens only), is one component of a comprehensive MRSA colonization surveillance program. It is not intended to diagnose MRSA infection nor to guide or monitor treatment for MRSA infections.      Labs: Basic Metabolic Panel:  Recent Labs Lab 05/01/14 0500 05/02/14 0500 05/03/14 0403 05/04/14 0242 05/05/14 0450  NA 142 139 140 137 137  K 4.6 4.1 4.1 4.3 4.3  CL 111 109 107 104 106  CO2 25 23 26 24 26   GLUCOSE 101* 96 78 106* 94  BUN 34* 29* 29* 28* 39*  CREATININE 1.27 1.22 1.34 1.39* 1.46*  CALCIUM 8.6 8.4 9.3 9.0 8.5   Liver Function Tests:  Recent Labs Lab 04/30/14 0535 05/01/14 0500 05/02/14 0500 05/03/14 0403 05/05/14 0450  AST 27 25 28 30 17   ALT 22 22 27 29 27   ALKPHOS 58 55 52 62 58  BILITOT 0.5 0.4 0.6 1.0 0.6  PROT 6.6 6.2 6.0 6.8 5.7*  ALBUMIN 3.5 3.3* 3.1* 3.4* 2.8*   CBC:  Recent Labs Lab 04/30/14 0535 05/01/14 0500 05/02/14 0500 05/03/14 0403 05/04/14 0242 05/05/14 0450  WBC 9.2 8.9 6.0 4.8 4.8  6.9  NEUTROABS 8.0* 7.0 4.5 4.0  --   --   HGB 12.8* 12.5* 12.7* 14.1 13.8 13.4  HCT 39.2 37.4* 38.3* 40.9 40.1 38.6*  MCV 81.2 80.6 80.5 78.7 78.0 79.9  PLT 208 206 192 202 207 209   BNP: BNP (last 3 results)  Recent Labs  04/28/14 1832 05/03/14 0410  BNP 14.7 83.1    IMAGING STUDIES  Dg Chest Port 1 View  05/03/2014   CLINICAL DATA:  Dyspnea, onset tonight  EXAM: PORTABLE CHEST - 1 VIEW  COMPARISON:  04/28/2014  FINDINGS: The right middle lobe consolidation persists. Mild streaky left base opacities also persist. Emphysematous changes are present, upper lobe predominant. Pulmonary vasculature is normal. No large effusions are evident.  IMPRESSION: Persistent consolidation in the right middle lobe superimposed on severe emphysematous changes.  Electronically Signed   By: Ellery Plunk M.D.   On: 05/03/2014 04:23    DISCHARGE EXAMINATION: Filed Vitals:   05/05/14 1937 05/05/14 2126 05/06/14 0525 05/06/14 0943  BP:  114/81 131/92   Pulse:  79 76   Temp:  98.9 F (37.2 C) 98 F (36.7 C)   TempSrc:  Oral Oral   Resp:  18 17   Height:      Weight:      SpO2: 98% 95% 96% 95%   General appearance: alert, cooperative, appears stated age and no distress Resp: Few scattered wheezes. No crackles. Cardio: regular rate and rhythm, S1, S2 normal, no murmur, click, rub or gallop GI: soft, non-tender; bowel sounds normal; no masses,  no organomegaly Extremities: extremities normal, atraumatic, no cyanosis or edema Neurologic: Alert and oriented X 3, normal strength and tone. Normal symmetric reflexes. Normal coordination and gait  DISPOSITION: Home  Discharge Instructions    Call MD for:  difficulty breathing, headache or visual disturbances    Complete by:  As directed      Call MD for:  extreme fatigue    Complete by:  As directed      Call MD for:  persistant dizziness or light-headedness    Complete by:  As directed      Call MD for:  persistant nausea and vomiting     Complete by:  As directed      Call MD for:  severe uncontrolled pain    Complete by:  As directed      Call MD for:  temperature >100.4    Complete by:  As directed      Diet - low sodium heart healthy    Complete by:  As directed      Discharge instructions    Complete by:  As directed   Please be sure to follow up with your PCP within 1 week.     Increase activity slowly    Complete by:  As directed            ALLERGIES:  Allergies  Allergen Reactions  . Motrin [Ibuprofen] Shortness Of Breath  . Nsaids Shortness Of Breath     Discharge Medication List as of 05/06/2014 10:07 AM    START taking these medications   Details  azithromycin (ZITHROMAX) 500 MG tablet Take 1 tablet (500 mg total) by mouth daily. For 3 more days, Starting 05/06/2014, Until Discontinued, Print    cefpodoxime (VANTIN) 200 MG tablet Take 1 tablet (200 mg total) by mouth every 12 (twelve) hours. For 4 more days, Starting 05/06/2014, Until Discontinued, Print    loratadine (CLARITIN) 10 MG tablet Take 1 tablet (10 mg total) by mouth daily., Starting 05/06/2014, Until Discontinued, Print      CONTINUE these medications which have CHANGED   Details  albuterol (PROVENTIL) (2.5 MG/3ML) 0.083% nebulizer solution Take 3 mLs (2.5 mg total) by nebulization every 4 (four) hours as needed for wheezing or shortness of breath., Starting 05/06/2014, Until Discontinued, Print    budesonide-formoterol (SYMBICORT) 160-4.5 MCG/ACT inhaler Take 2 puffs first thing in am and then another 2 puffs about 12 hours later., Print    predniSONE (DELTASONE) 20 MG tablet Take 3 tablets once daily for 3 days, then take 2 tablets once daily for 3 days, then take 1 tablet once daily for 3 days, then STOP., Print      CONTINUE these medications which have NOT CHANGED   Details  albuterol (PROVENTIL HFA;VENTOLIN HFA)  108 (90 BASE) MCG/ACT inhaler Inhale 2 puffs into the lungs every 6 (six) hours as needed for wheezing., Starting  08/18/2013, Until Discontinued, Print    Famotidine (PEPCID PO) Take 1 tablet by mouth at bedtime. , Until Discontinued, Historical Med    fluticasone (FLONASE) 50 MCG/ACT nasal spray Place 2 sprays into both nostrils daily., Starting 12/06/2013, Until Discontinued, Print      STOP taking these medications     levofloxacin (LEVAQUIN) 750 MG tablet        Follow-up Information    Follow up with Doris Cheadle, MD. Schedule an appointment as soon as possible for a visit in 1 week.   Specialty:  Internal Medicine   Why:  post hospitalization follow up   Contact information:   [address] [place] [address] [phone]       Follow up with Osborn Coho, MD.   Specialty:  Otolaryngology   Why:  as scheduled   Contact information:   [address] [place] [address] [phone]       TOTAL DISCHARGE TIME: 35 minutes  [facility]  [facility] Pager [phone]  05/06/2014, 11:45 AM

## 2014-05-06 NOTE — Discharge Instructions (Signed)

## 2014-05-09 ENCOUNTER — Inpatient Hospital Stay (HOSPITAL_COMMUNITY)
Admission: RE | Admit: 2014-05-09 | Discharge: 2014-05-09 | Disposition: A | Discharge status: Home / Self Care | Payer: Commercial Managed Care - HMO | Source: Ambulatory Visit

## 2014-05-09 ENCOUNTER — Encounter (HOSPITAL_COMMUNITY): Payer: Self-pay | Admitting: Vascular Surgery

## 2014-05-10 ENCOUNTER — Encounter (HOSPITAL_COMMUNITY): Admission: RE | Payer: Self-pay | Source: Ambulatory Visit

## 2014-05-10 ENCOUNTER — Ambulatory Visit (HOSPITAL_COMMUNITY)
Admission: RE | Admit: 2014-05-10 | Payer: Commercial Managed Care - HMO | Source: Ambulatory Visit | Admitting: Otolaryngology

## 2014-05-10 SURGERY — SURGERY, PARANASAL SINUS, ENDOSCOPIC, WITH NASAL SEPTOPLASTY, TURBINOPLASTY, AND MAXILLARY SINUSOTOMY
Anesthesia: General | Laterality: Bilateral

## 2014-05-13 ENCOUNTER — Ambulatory Visit: Payer: Commercial Managed Care - HMO | Attending: Internal Medicine | Admitting: Internal Medicine

## 2014-05-13 ENCOUNTER — Encounter: Payer: Self-pay | Admitting: Internal Medicine

## 2014-05-13 VITALS — BP 100/70 | HR 86 | Temp 97.8°F | Resp 16 | Wt 148.0 lb

## 2014-05-13 DIAGNOSIS — J449 Chronic obstructive pulmonary disease, unspecified: Secondary | ICD-10-CM | POA: Diagnosis not present

## 2014-05-13 DIAGNOSIS — I129 Hypertensive chronic kidney disease with stage 1 through stage 4 chronic kidney disease, or unspecified chronic kidney disease: Secondary | ICD-10-CM | POA: Diagnosis not present

## 2014-05-13 DIAGNOSIS — N189 Chronic kidney disease, unspecified: Secondary | ICD-10-CM | POA: Insufficient documentation

## 2014-05-13 DIAGNOSIS — J189 Pneumonia, unspecified organism: Secondary | ICD-10-CM | POA: Insufficient documentation

## 2014-05-13 LAB — COMPLETE METABOLIC PANEL WITH GFR
ALT: 33 U/L (ref 0–53)
AST: 21 U/L (ref 0–37)
Albumin: 3.3 g/dL — ABNORMAL LOW (ref 3.5–5.2)
Alkaline Phosphatase: 68 U/L (ref 39–117)
BUN: 37 mg/dL — ABNORMAL HIGH (ref 6–23)
CALCIUM: 8.8 mg/dL (ref 8.4–10.5)
CHLORIDE: 103 meq/L (ref 96–112)
CO2: 27 mEq/L (ref 19–32)
CREATININE: 1.23 mg/dL (ref 0.50–1.35)
GFR, EST AFRICAN AMERICAN: 71 mL/min
GFR, Est Non African American: 61 mL/min
Glucose, Bld: 119 mg/dL — ABNORMAL HIGH (ref 70–99)
Potassium: 4.8 mEq/L (ref 3.5–5.3)
Sodium: 139 mEq/L (ref 135–145)
TOTAL PROTEIN: 6 g/dL (ref 6.0–8.3)
Total Bilirubin: 0.3 mg/dL (ref 0.2–1.2)

## 2014-05-13 NOTE — Progress Notes (Signed)
Patient here for follow up from the hospital Was admitted for COPD exacerbation, respiratory failure and pneumonia Patient has two days left of his prednisone

## 2014-05-13 NOTE — Progress Notes (Signed)
MRN: [id number] Name: Nathaniel Hartman  Sex: male Age: 64 y.o. DOB: 1950-06-28  Allergies: Motrin and Nsaids  Chief Complaint  Patient presents with  . Hospitalization Follow-up    HPI: Patient is 64 y.o. male who has history of COPD, chronic sinusitis, GERD recently hospitalized twice with symptoms of acute respiratory failure shortness of breath was diagnosed with pneumonia and was treated for COPD exacerbation, patient was given antibiotic and subsequently discharged on oral antibiotics which as per patient he has already completed currently also on tapering dose of prednisone currently denies any fever chills chest and shortness of breath has been using his inhalers, patient used to follow with pulmonologist, he is advised to have another followup.patient also has chronic renal insufficiency.  Past Medical History  Diagnosis Date  . Bilateral pneumonia 07/31/2007  . Asthma   . Chronic cough   . Pansinusitis   . COPD (chronic obstructive pulmonary disease)   . Hypertension   . Shortness of breath   . Chronic kidney disease   . GERD (gastroesophageal reflux disease)     Past Surgical History  Procedure Laterality Date  . Left hand surgery  08/2006      Medication List       This list is accurate as of: 05/13/14 10:39 AM.  Always use your most recent med list.               albuterol 108 (90 BASE) MCG/ACT inhaler  Commonly known as:  PROVENTIL HFA;VENTOLIN HFA  Inhale 2 puffs into the lungs every 6 (six) hours as needed for wheezing.     albuterol (2.5 MG/3ML) 0.083% nebulizer solution  Commonly known as:  PROVENTIL  Take 3 mLs (2.5 mg total) by nebulization every 4 (four) hours as needed for wheezing or shortness of breath.     azithromycin 500 MG tablet  Commonly known as:  ZITHROMAX  Take 1 tablet (500 mg total) by mouth daily. For 3 more days     budesonide-formoterol 160-4.5 MCG/ACT inhaler  Commonly known as:  SYMBICORT  Take 2 puffs first thing in am and  then another 2 puffs about 12 hours later.     cefpodoxime 200 MG tablet  Commonly known as:  VANTIN  Take 1 tablet (200 mg total) by mouth every 12 (twelve) hours. For 4 more days     fluticasone 50 MCG/ACT nasal spray  Commonly known as:  FLONASE  Place 2 sprays into both nostrils daily.     loratadine 10 MG tablet  Commonly known as:  CLARITIN  Take 1 tablet (10 mg total) by mouth daily.     PEPCID PO  Take 1 tablet by mouth at bedtime.     predniSONE 20 MG tablet  Commonly known as:  DELTASONE  Take 3 tablets once daily for 3 days, then take 2 tablets once daily for 3 days, then take 1 tablet once daily for 3 days, then STOP.        No orders of the defined types were placed in this encounter.    Immunization History  Administered Date(s) Administered  . Influenza Split 11/14/2010  . Influenza Whole 11/06/2007, 11/08/2008, 10/31/2009, 11/12/2011  . Influenza,inj,Quad PF,36+ Mos 12/07/2012  . Pneumococcal Polysaccharide-23 10/31/2009, 12/31/2012    History reviewed. No pertinent family history.  History  Substance Use Topics  . Smoking status: Former Smoker -- 0.50 packs/day for 4 years    Quit date: 02/11/1997  . Smokeless tobacco: Not on file  .  Alcohol Use: No    Review of Systems   As noted in HPI  Filed Vitals:   05/13/14 1001  BP: 100/70  Pulse: 86  Temp: 97.8 F (36.6 C)  Resp: 16    Physical Exam  Physical Exam  Constitutional: No distress.  Eyes: EOM are normal. Pupils are equal, round, and reactive to light.  Cardiovascular: Normal rate and regular rhythm.   Pulmonary/Chest: Breath sounds normal. No respiratory distress. He has no wheezes. He has no rales.  Musculoskeletal: He exhibits no edema.    CBC    Component Value Date/Time   WBC 6.9 05/05/2014 0450   RBC 4.83 05/05/2014 0450   HGB 13.4 05/05/2014 0450   HCT 38.6* 05/05/2014 0450   PLT 209 05/05/2014 0450   MCV 79.9 05/05/2014 0450   LYMPHSABS 0.6* 05/03/2014 0403    MONOABS 0.3 05/03/2014 0403   EOSABS 0.0 05/03/2014 0403   BASOSABS 0.0 05/03/2014 0403    CMP     Component Value Date/Time   NA 137 05/05/2014 0450   K 4.3 05/05/2014 0450   CL 106 05/05/2014 0450   CO2 26 05/05/2014 0450   GLUCOSE 94 05/05/2014 0450   BUN 39* 05/05/2014 0450   CREATININE 1.46* 05/05/2014 0450   CREATININE 1.28 12/06/2013 1234   CALCIUM 8.5 05/05/2014 0450   PROT 5.7* 05/05/2014 0450   ALBUMIN 2.8* 05/05/2014 0450   AST 17 05/05/2014 0450   ALT 27 05/05/2014 0450   ALKPHOS 58 05/05/2014 0450   BILITOT 0.6 05/05/2014 0450   GFRNONAA 49* 05/05/2014 0450   GFRNONAA 58* 12/06/2013 1234   GFRAA 56* 05/05/2014 0450   GFRAA 67 12/06/2013 1234    Lab Results  Component Value Date/Time   CHOL 166 12/06/2013 12:34 PM    No components found for: HGA1C  Lab Results  Component Value Date/Time   AST 17 05/05/2014 04:50 AM    Assessment and Plan  Pneumonia, organism unspecified - Plan:patient is completed the course of antibiotic, will repeat  DG Chest 2 View in 4 weeks  CKD (chronic kidney disease), unspecified stage - Plan: will repeat blood chemistry COMPLETE METABOLIC PANEL WITH GFR  COPD GOLD II with marked reversibility  - Plan: currently on Symbicort, Lipitor when necessary DG Chest 2 View, patient is also advised to follow with his pulmonologist.    Return in about 3 months (around 08/12/2014), or if symptoms worsen or fail to improve.   This note has been created with Education officer, environmentalDragon speech recognition software and smart phrase technology. Any transcriptional errors are unintentional.    Doris CheadleADVANI, Vesna Kable, MD

## 2014-05-16 ENCOUNTER — Telehealth: Payer: Self-pay

## 2014-05-16 NOTE — Telephone Encounter (Signed)
Patient not available Unable to leave message Requesting access number

## 2014-05-16 NOTE — Telephone Encounter (Signed)
-----   Message from Doris Cheadleeepak Advani, MD sent at 05/16/2014  9:11 AM EDT ----- Call and let the patient know that his kidney function is improved.

## 2014-05-26 ENCOUNTER — Other Ambulatory Visit: Payer: Self-pay | Admitting: Otolaryngology

## 2014-05-31 ENCOUNTER — Encounter (HOSPITAL_COMMUNITY): Payer: Self-pay

## 2014-05-31 ENCOUNTER — Other Ambulatory Visit (HOSPITAL_COMMUNITY): Payer: Self-pay | Admitting: *Deleted

## 2014-05-31 ENCOUNTER — Encounter (HOSPITAL_COMMUNITY)
Admission: RE | Admit: 2014-05-31 | Discharge: 2014-05-31 | Disposition: A | Discharge status: Home / Self Care | Payer: Commercial Managed Care - HMO | Source: Ambulatory Visit | Attending: Otolaryngology | Admitting: Otolaryngology

## 2014-05-31 DIAGNOSIS — Z886 Allergy status to analgesic agent status: Secondary | ICD-10-CM | POA: Diagnosis not present

## 2014-05-31 DIAGNOSIS — J449 Chronic obstructive pulmonary disease, unspecified: Secondary | ICD-10-CM | POA: Diagnosis not present

## 2014-05-31 DIAGNOSIS — Z87891 Personal history of nicotine dependence: Secondary | ICD-10-CM | POA: Diagnosis not present

## 2014-05-31 DIAGNOSIS — J45909 Unspecified asthma, uncomplicated: Secondary | ICD-10-CM | POA: Diagnosis not present

## 2014-05-31 DIAGNOSIS — J338 Other polyp of sinus: Secondary | ICD-10-CM | POA: Diagnosis not present

## 2014-05-31 DIAGNOSIS — Z7951 Long term (current) use of inhaled steroids: Secondary | ICD-10-CM | POA: Diagnosis not present

## 2014-05-31 DIAGNOSIS — N189 Chronic kidney disease, unspecified: Secondary | ICD-10-CM | POA: Diagnosis not present

## 2014-05-31 DIAGNOSIS — J329 Chronic sinusitis, unspecified: Secondary | ICD-10-CM | POA: Diagnosis present

## 2014-05-31 DIAGNOSIS — K219 Gastro-esophageal reflux disease without esophagitis: Secondary | ICD-10-CM | POA: Diagnosis not present

## 2014-05-31 DIAGNOSIS — D649 Anemia, unspecified: Secondary | ICD-10-CM | POA: Diagnosis not present

## 2014-05-31 DIAGNOSIS — I129 Hypertensive chronic kidney disease with stage 1 through stage 4 chronic kidney disease, or unspecified chronic kidney disease: Secondary | ICD-10-CM | POA: Diagnosis not present

## 2014-05-31 DIAGNOSIS — Z8701 Personal history of pneumonia (recurrent): Secondary | ICD-10-CM | POA: Diagnosis not present

## 2014-05-31 LAB — BASIC METABOLIC PANEL
Anion gap: 8 (ref 5–15)
BUN: 27 mg/dL — AB (ref 6–23)
CALCIUM: 8.9 mg/dL (ref 8.4–10.5)
CO2: 25 mmol/L (ref 19–32)
CREATININE: 1.11 mg/dL (ref 0.50–1.35)
Chloride: 106 mmol/L (ref 96–112)
GFR calc non Af Amer: 68 mL/min — ABNORMAL LOW (ref >90)
GFR, EST AFRICAN AMERICAN: 79 mL/min — AB (ref >90)
Glucose, Bld: 94 mg/dL (ref 70–99)
POTASSIUM: 4 mmol/L (ref 3.5–5.1)
Sodium: 139 mmol/L (ref 135–145)

## 2014-05-31 LAB — CBC
HCT: 37.2 % — ABNORMAL LOW (ref 39.0–52.0)
Hemoglobin: 11.9 g/dL — ABNORMAL LOW (ref 13.0–17.0)
MCH: 26.3 pg (ref 26.0–34.0)
MCHC: 32 g/dL (ref 30.0–36.0)
MCV: 82.3 fL (ref 78.0–100.0)
PLATELETS: 257 10*3/uL (ref 150–400)
RBC: 4.52 MIL/uL (ref 4.22–5.81)
RDW: 15.7 % — AB (ref 11.5–15.5)
WBC: 4.4 10*3/uL (ref 4.0–10.5)

## 2014-05-31 NOTE — Pre-Procedure Instructions (Signed)
Nathaniel Hartman  05/31/2014   Your procedure is scheduled on:  Thursday, June 02, 2014 at 9:30 AM.   Report to [facility] Entrance "A" Admitting Office at 7:30 AM.   Call this number if you have problems the morning of surgery: [phone]               Any questions prior to day of surgery, please call [phone] between 8 & 4 PM.    Remember:   Do not eat food or drink liquids after midnight Wednesday, 06/01/14.   Take these medicines the morning of surgery with A SIP OF WATER: Loratadine (Claritin), Symbicort inhaler, Flonase, Albuterol inhaler - if needed, Albuterol nebulizer - if needed.   Do not wear jewelry.  Do not wear lotions, powders, or cologne. You may wear deodorant.  Men may shave face and neck.  Do not bring valuables to the hospital.  [facility] is not responsible                  for any belongings or valuables.               Contacts, dentures or bridgework may not be worn into surgery.  Leave suitcase in the car. After surgery it may be brought to your room.  For patients admitted to the hospital, discharge time is determined by your                treatment team.               Special Instructions: [place] - Preparing for Surgery  Before surgery, you can play an important role.  Because skin is not sterile, your skin needs to be as free of germs as possible.  You can reduce the number of germs on you skin by washing with CHG (chlorahexidine gluconate) soap before surgery.  CHG is an antiseptic cleaner which kills germs and bonds with the skin to continue killing germs even after washing.  Please DO NOT use if you have an allergy to CHG or antibacterial soaps.  If your skin becomes reddened/irritated stop using the CHG and inform your nurse when you arrive at Short Stay.  Do not shave (including legs and underarms) for at least 48 hours prior to the first CHG shower.  You may shave your face.  Please follow these instructions carefully:   1.   Shower with CHG Soap the night before surgery and the                                morning of Surgery.  2.  If you choose to wash your hair, wash your hair first as usual with your       normal shampoo.  3.  After you shampoo, rinse your hair and body thoroughly to remove the                      Shampoo.  4.  Use CHG as you would any other liquid soap.  You can apply chg directly       to the skin and wash gently with scrungie or a clean washcloth.  5.  Apply the CHG Soap to your body ONLY FROM THE NECK DOWN.        Do not use on open wounds or open sores.  Avoid contact with your eyes, ears, mouth and genitals (private parts).  Wash  genitals (private parts) with your normal soap.  6.  Wash thoroughly, paying special attention to the area where your surgery        will be performed.  7.  Thoroughly rinse your body with warm water from the neck down.  8.  DO NOT shower/wash with your normal soap after using and rinsing off       the CHG Soap.  9.  Pat yourself dry with a clean towel.            10.  Wear clean pajamas.            11.  Place clean sheets on your bed the night of your first shower and do not        sleep with pets.  Day of Surgery  Do not apply any lotions the morning of surgery.  Please wear clean clothes to the hospital.     Please read over the following fact sheets that you were given: Pain Booklet, Coughing and Deep Breathing and Surgical Site Infection Prevention

## 2014-06-01 MED ORDER — DEXAMETHASONE SODIUM PHOSPHATE 10 MG/ML IJ SOLN
10.0000 mg | Freq: Once | INTRAMUSCULAR | Status: AC
  Administered 2014-06-02: 10 mg via INTRAVENOUS
  Filled 2014-06-01: qty 1

## 2014-06-01 MED ORDER — CEFAZOLIN SODIUM-DEXTROSE 2-3 GM-% IV SOLR
2.0000 g | INTRAVENOUS | Status: AC
  Administered 2014-06-02: 2 g via INTRAVENOUS
  Filled 2014-06-01: qty 50

## 2014-06-02 ENCOUNTER — Encounter (HOSPITAL_COMMUNITY)
Admission: RE | Disposition: A | Discharge status: Home / Self Care | Payer: Self-pay | Source: Ambulatory Visit | Attending: Otolaryngology

## 2014-06-02 ENCOUNTER — Ambulatory Visit (HOSPITAL_COMMUNITY): Payer: Commercial Managed Care - HMO | Admitting: Anesthesiology

## 2014-06-02 ENCOUNTER — Encounter (HOSPITAL_COMMUNITY): Payer: Self-pay | Admitting: *Deleted

## 2014-06-02 ENCOUNTER — Ambulatory Visit (HOSPITAL_COMMUNITY)
Admission: RE | Admit: 2014-06-02 | Discharge: 2014-06-02 | Disposition: A | Discharge status: Home / Self Care | Payer: Commercial Managed Care - HMO | Source: Ambulatory Visit | Attending: Otolaryngology | Admitting: Otolaryngology

## 2014-06-02 DIAGNOSIS — K219 Gastro-esophageal reflux disease without esophagitis: Secondary | ICD-10-CM | POA: Insufficient documentation

## 2014-06-02 DIAGNOSIS — J449 Chronic obstructive pulmonary disease, unspecified: Secondary | ICD-10-CM | POA: Insufficient documentation

## 2014-06-02 DIAGNOSIS — Z87891 Personal history of nicotine dependence: Secondary | ICD-10-CM | POA: Insufficient documentation

## 2014-06-02 DIAGNOSIS — Z7951 Long term (current) use of inhaled steroids: Secondary | ICD-10-CM | POA: Insufficient documentation

## 2014-06-02 DIAGNOSIS — N189 Chronic kidney disease, unspecified: Secondary | ICD-10-CM | POA: Insufficient documentation

## 2014-06-02 DIAGNOSIS — J329 Chronic sinusitis, unspecified: Secondary | ICD-10-CM | POA: Diagnosis not present

## 2014-06-02 DIAGNOSIS — J338 Other polyp of sinus: Secondary | ICD-10-CM | POA: Insufficient documentation

## 2014-06-02 DIAGNOSIS — I129 Hypertensive chronic kidney disease with stage 1 through stage 4 chronic kidney disease, or unspecified chronic kidney disease: Secondary | ICD-10-CM | POA: Insufficient documentation

## 2014-06-02 DIAGNOSIS — Z886 Allergy status to analgesic agent status: Secondary | ICD-10-CM | POA: Insufficient documentation

## 2014-06-02 DIAGNOSIS — D649 Anemia, unspecified: Secondary | ICD-10-CM | POA: Insufficient documentation

## 2014-06-02 DIAGNOSIS — Z8701 Personal history of pneumonia (recurrent): Secondary | ICD-10-CM | POA: Insufficient documentation

## 2014-06-02 DIAGNOSIS — J45909 Unspecified asthma, uncomplicated: Secondary | ICD-10-CM | POA: Insufficient documentation

## 2014-06-02 HISTORY — PX: NASAL SINUS SURGERY: SHX719

## 2014-06-02 HISTORY — PX: POLYPECTOMY: SHX149

## 2014-06-02 SURGERY — SINUS SURGERY, ENDOSCOPIC
Anesthesia: General | Site: Nose

## 2014-06-02 MED ORDER — MUPIROCIN CALCIUM 2 % EX CREA
TOPICAL_CREAM | CUTANEOUS | Status: DC | PRN
  Administered 2014-06-02: 1 via TOPICAL

## 2014-06-02 MED ORDER — OXYMETAZOLINE HCL 0.05 % NA SOLN
NASAL | Status: DC | PRN
  Administered 2014-06-02: 2 via NASAL

## 2014-06-02 MED ORDER — BUDESONIDE-FORMOTEROL FUMARATE 160-4.5 MCG/ACT IN AERO
2.0000 | INHALATION_SPRAY | Freq: Two times a day (BID) | RESPIRATORY_TRACT | Status: DC
Start: 2014-06-02 — End: 2014-07-05

## 2014-06-02 MED ORDER — TRIAMCINOLONE ACETONIDE 40 MG/ML IJ SUSP
INTRAMUSCULAR | Status: DC | PRN
  Administered 2014-06-02: 40 mg

## 2014-06-02 MED ORDER — OXYMETAZOLINE HCL 0.05 % NA SOLN
NASAL | Status: AC
  Filled 2014-06-02: qty 30

## 2014-06-02 MED ORDER — FENTANYL CITRATE (PF) 100 MCG/2ML IJ SOLN
INTRAMUSCULAR | Status: DC | PRN
  Administered 2014-06-02: 100 ug via INTRAVENOUS
  Administered 2014-06-02: 50 ug via INTRAVENOUS

## 2014-06-02 MED ORDER — EPHEDRINE SULFATE 50 MG/ML IJ SOLN
INTRAMUSCULAR | Status: DC | PRN
  Administered 2014-06-02 (×2): 10 mg via INTRAVENOUS

## 2014-06-02 MED ORDER — HYDROCODONE-ACETAMINOPHEN 5-325 MG PO TABS: 1.0000 | ORAL_TABLET | Freq: Four times a day (QID) | ORAL | Status: DC | PRN

## 2014-06-02 MED ORDER — LIDOCAINE-EPINEPHRINE 1 %-1:100000 IJ SOLN
INTRAMUSCULAR | Status: DC | PRN
  Administered 2014-06-02: 30 mL

## 2014-06-02 MED ORDER — OXYCODONE HCL 5 MG/5ML PO SOLN: 5.0000 mg | Freq: Once | ORAL | Status: DC | PRN

## 2014-06-02 MED ORDER — FENTANYL CITRATE (PF) 250 MCG/5ML IJ SOLN
INTRAMUSCULAR | Status: AC
  Filled 2014-06-02: qty 5

## 2014-06-02 MED ORDER — MUPIROCIN CALCIUM 2 % EX CREA
TOPICAL_CREAM | CUTANEOUS | Status: AC
  Filled 2014-06-02: qty 15

## 2014-06-02 MED ORDER — OXYMETAZOLINE HCL 0.05 % NA SOLN
NASAL | Status: DC | PRN
  Administered 2014-06-02 (×2): 1 via NASAL

## 2014-06-02 MED ORDER — ONDANSETRON HCL 4 MG/2ML IJ SOLN
INTRAMUSCULAR | Status: DC | PRN
  Administered 2014-06-02: 4 mg via INTRAVENOUS

## 2014-06-02 MED ORDER — CEFAZOLIN SODIUM-DEXTROSE 2-3 GM-% IV SOLR: 2000.0000 mg | Freq: Once | INTRAVENOUS | Status: DC

## 2014-06-02 MED ORDER — MIDAZOLAM HCL 2 MG/2ML IJ SOLN
INTRAMUSCULAR | Status: AC
  Filled 2014-06-02: qty 2

## 2014-06-02 MED ORDER — MIDAZOLAM HCL 5 MG/5ML IJ SOLN
INTRAMUSCULAR | Status: DC | PRN
  Administered 2014-06-02: 1 mg via INTRAVENOUS

## 2014-06-02 MED ORDER — OXYCODONE HCL 5 MG PO TABS: 5.0000 mg | ORAL_TABLET | Freq: Once | ORAL | Status: DC | PRN

## 2014-06-02 MED ORDER — AMOXICILLIN-POT CLAVULANATE 500-125 MG PO TABS
1.0000 | ORAL_TABLET | Freq: Two times a day (BID) | ORAL | Status: DC
Start: 2014-06-02 — End: 2015-01-14

## 2014-06-02 MED ORDER — LIDOCAINE-EPINEPHRINE 1 %-1:100000 IJ SOLN
INTRAMUSCULAR | Status: AC
Start: 2014-06-02 — End: 2014-06-02
  Filled 2014-06-02: qty 1

## 2014-06-02 MED ORDER — EPINEPHRINE HCL 1 MG/ML IJ SOLN: INTRAMUSCULAR | Status: DC | PRN

## 2014-06-02 MED ORDER — SUCCINYLCHOLINE CHLORIDE 20 MG/ML IJ SOLN
INTRAMUSCULAR | Status: DC | PRN
  Administered 2014-06-02: 100 mg via INTRAVENOUS

## 2014-06-02 MED ORDER — PROPOFOL 10 MG/ML IV BOLUS
INTRAVENOUS | Status: DC | PRN
  Administered 2014-06-02: 150 mg via INTRAVENOUS

## 2014-06-02 MED ORDER — DEXAMETHASONE SODIUM PHOSPHATE 10 MG/ML IJ SOLN
10.0000 mg | Freq: Once | INTRAMUSCULAR | Status: DC
Start: 2014-06-02 — End: 2014-06-02

## 2014-06-02 MED ORDER — LACTATED RINGERS IV SOLN
INTRAVENOUS | Status: DC
  Administered 2014-06-02: 07:00:00 via INTRAVENOUS

## 2014-06-02 MED ORDER — PHENYLEPHRINE HCL 10 MG/ML IJ SOLN
INTRAMUSCULAR | Status: DC | PRN
  Administered 2014-06-02 (×2): 40 ug via INTRAVENOUS

## 2014-06-02 MED ORDER — SODIUM CHLORIDE 0.9 % IR SOLN
Status: DC | PRN
  Administered 2014-06-02: 1000 mL

## 2014-06-02 MED ORDER — LIDOCAINE HCL 4 % MT SOLN
OROMUCOSAL | Status: DC | PRN
  Administered 2014-06-02: 4 mL via TOPICAL

## 2014-06-02 MED ORDER — PROPOFOL 10 MG/ML IV BOLUS
INTRAVENOUS | Status: AC
  Filled 2014-06-02: qty 20

## 2014-06-02 MED ORDER — ARTIFICIAL TEARS OP OINT
TOPICAL_OINTMENT | OPHTHALMIC | Status: DC | PRN
  Administered 2014-06-02: 1 via OPHTHALMIC

## 2014-06-02 MED ORDER — PROPOFOL 10 MG/ML IV BOLUS
INTRAVENOUS | Status: AC
Start: 2014-06-02 — End: 2014-06-02
  Filled 2014-06-02: qty 20

## 2014-06-02 MED ORDER — HYDROMORPHONE HCL 1 MG/ML IJ SOLN: 0.2500 mg | INTRAMUSCULAR | Status: DC | PRN

## 2014-06-02 MED ORDER — PROMETHAZINE HCL 25 MG/ML IJ SOLN: 6.2500 mg | INTRAMUSCULAR | Status: DC | PRN

## 2014-06-02 MED ORDER — LIDOCAINE HCL (CARDIAC) 20 MG/ML IV SOLN
INTRAVENOUS | Status: DC | PRN
  Administered 2014-06-02: 40 mg via INTRAVENOUS

## 2014-06-02 MED ORDER — LACTATED RINGERS IV SOLN
INTRAVENOUS | Status: DC | PRN
  Administered 2014-06-02 (×2): via INTRAVENOUS

## 2014-06-02 MED ORDER — TRIAMCINOLONE ACETONIDE 40 MG/ML IJ SUSP
INTRAMUSCULAR | Status: AC
Start: 2014-06-02 — End: 2014-06-02
  Filled 2014-06-02: qty 5

## 2014-06-02 SURGICAL SUPPLY — 34 items
BLADE ROTATE RAD 40 4 M4 (BLADE) ×3 IMPLANT
BLADE ROTATE TRICUT 4X13 M4 (BLADE) ×3 IMPLANT
BLADE TRICUT ROTATE M4 4 5PK (BLADE) ×3 IMPLANT
CANISTER SUCTION 2500CC (MISCELLANEOUS) ×6 IMPLANT
DRAPE PROXIMA HALF (DRAPES) IMPLANT
DRESSING NASAL KENNEDY 3.5X.9 (MISCELLANEOUS) ×2 IMPLANT
DRSG NASAL KENNEDY 3.5X.9 (MISCELLANEOUS) ×3
ELECT REM PT RETURN 9FT ADLT (ELECTROSURGICAL) ×3
ELECTRODE REM PT RTRN 9FT ADLT (ELECTROSURGICAL) ×2 IMPLANT
FILTER ARTHROSCOPY CONVERTOR (FILTER) ×6 IMPLANT
GLOVE BIOGEL M 7.0 STRL (GLOVE) ×6 IMPLANT
GLOVE BIOGEL PI IND STRL 7.0 (GLOVE) ×2 IMPLANT
GLOVE BIOGEL PI INDICATOR 7.0 (GLOVE) ×1
GLOVE SURG SS PI 7.0 STRL IVOR (GLOVE) ×3 IMPLANT
GOWN STRL REUS W/ TWL LRG LVL3 (GOWN DISPOSABLE) ×4 IMPLANT
GOWN STRL REUS W/TWL LRG LVL3 (GOWN DISPOSABLE) ×2
KIT BASIN OR (CUSTOM PROCEDURE TRAY) ×3 IMPLANT
KIT ROOM TURNOVER OR (KITS) ×3 IMPLANT
MEROCEL KENNEDY ×3 IMPLANT
NEEDLE HYPO 25GX1X1/2 BEV (NEEDLE) IMPLANT
NS IRRIG 1000ML POUR BTL (IV SOLUTION) ×3 IMPLANT
PAD ARMBOARD 7.5X6 YLW CONV (MISCELLANEOUS) ×6 IMPLANT
SOLUTION ANTI FOG 6CC (MISCELLANEOUS) ×3 IMPLANT
SPONGE NEURO XRAY DETECT 1X3 (DISPOSABLE) ×3 IMPLANT
SUT ETHILON 3 0 PS 1 (SUTURE) IMPLANT
SYR 50ML SLIP (SYRINGE) IMPLANT
SYR BULB 3OZ (MISCELLANEOUS) IMPLANT
SYR CONTROL 10ML LL (SYRINGE) ×3 IMPLANT
TOWEL OR 17X24 6PK STRL BLUE (TOWEL DISPOSABLE) ×3 IMPLANT
TRACKER ENT INSTRUMENT (MISCELLANEOUS) ×6 IMPLANT
TRACKER ENT PATIENT (MISCELLANEOUS) ×3 IMPLANT
TRAY ENT MC OR (CUSTOM PROCEDURE TRAY) ×3 IMPLANT
TUBE CONNECTING 12X1/4 (SUCTIONS) ×3 IMPLANT
TUBING STRAIGHTSHOT EPS 5PK (TUBING) ×3 IMPLANT

## 2014-06-02 NOTE — Brief Op Note (Signed)
06/02/2014  11:57 AM  PATIENT:  Nathaniel Hartman  64 y.o. male  PRE-OPERATIVE DIAGNOSIS:  CHRONIC SINUSITIS  POST-OPERATIVE DIAGNOSIS:  CHRONIC SINUSITIS  PROCEDURE:  Procedure(s): BILATERAL ENDOSCOPIC SINUS SURGERY WITH FUSION  (Bilateral) POLYPECTOMY NASAL (N/A)  SURGEON:  Surgeon(s) and Role:    * Osborn Cohoavid Messiyah Waterson, MD - Primary  PHYSICIAN ASSISTANT:   ASSISTANTS: none   ANESTHESIA:   general  EBL:  Total I/O In: 1100 [I.V.:1100] Out: 200 [Blood:200]  BLOOD ADMINISTERED:none  DRAINS: none   LOCAL MEDICATIONS USED:  Amount: 6 ml and NONE  SPECIMEN:  Source of Specimen:  Sinus Contents  DISPOSITION OF SPECIMEN:  PATHOLOGY  COUNTS:  YES  TOURNIQUET:  * No tourniquets in log *  DICTATION: .Other Dictation: Dictation Number [id number]  PLAN OF CARE: Discharge to home after PACU  PATIENT DISPOSITION:  PACU - hemodynamically stable.   Delay start of Pharmacological VTE agent (>24hrs) due to surgical blood loss or risk of bleeding: not applicable

## 2014-06-02 NOTE — Transfer of Care (Signed)
Immediate Anesthesia Transfer of Care Note  Patient: Nathaniel Hartman  Procedure(s) Performed: Procedure(s): BILATERAL ENDOSCOPIC SINUS SURGERY WITH FUSION  (Bilateral) POLYPECTOMY NASAL (N/A)  Patient Location: PACU  Anesthesia Type:General  Level of Consciousness: awake, alert  and oriented  Airway & Oxygen Therapy: Patient Spontanous Breathing and Patient connected to nasal cannula oxygen  Post-op Assessment: Report given to RN and Post -op Vital signs reviewed and stable  Post vital signs: Reviewed and stable  Last Vitals:  Filed Vitals:   06/02/14 0717  BP: 139/96  Pulse: 71  Temp: 36.3 C  Resp: 18    Complications: No complications

## 2014-06-02 NOTE — Anesthesia Preprocedure Evaluation (Addendum)
Anesthesia Evaluation  Patient identified by MRN, date of birth, ID band Patient awake    Reviewed: Allergy & Precautions, NPO status , Patient's Chart, lab work & pertinent test results  Airway Mallampati: II  TM Distance: >3 FB Neck ROM: Full    Dental   Pulmonary asthma , pneumonia -, resolved, COPD COPD inhaler, former smoker,  breath sounds clear to auscultation        Cardiovascular Rhythm:Regular Rate:Normal     Neuro/Psych negative neurological ROS     GI/Hepatic Neg liver ROS, GERD-  ,  Endo/Other  negative endocrine ROS  Renal/GU Renal InsufficiencyRenal diseaseCr 1.11     Musculoskeletal   Abdominal   Peds  Hematology  (+) anemia , Hgb 11.9   Anesthesia Other Findings   Reproductive/Obstetrics                            Anesthesia Physical Anesthesia Plan  ASA: III  Anesthesia Plan: General   Post-op Pain Management:    Induction: Intravenous  Airway Management Planned: Oral ETT  Additional Equipment:   Intra-op Plan:   Post-operative Plan: Extubation in OR  Informed Consent: I have reviewed the patients History and Physical, chart, labs and discussed the procedure including the risks, benefits and alternatives for the proposed anesthesia with the patient or authorized representative who has indicated his/her understanding and acceptance.   Dental advisory given  Plan Discussed with: CRNA  Anesthesia Plan Comments:         Anesthesia Quick Evaluation

## 2014-06-02 NOTE — Anesthesia Procedure Notes (Signed)
Procedure Name: Intubation Date/Time: 06/02/2014 10:22 AM Performed by: Suzy Bouchard Pre-anesthesia Checklist: Patient identified, Emergency Drugs available, Suction available, Patient being monitored and Timeout performed Patient Re-evaluated:Patient Re-evaluated prior to inductionOxygen Delivery Method: Circle system utilized Preoxygenation: Pre-oxygenation with 100% oxygen Intubation Type: IV induction Ventilation: Mask ventilation without difficulty Laryngoscope Size: Miller and 2 Grade View: Grade I Number of attempts: 1 Airway Equipment and Method: Stylet and LTA kit utilized Placement Confirmation: ETT inserted through vocal cords under direct vision,  positive ETCO2 and breath sounds checked- equal and bilateral Secured at: 23 cm Tube secured with: Tape Dental Injury: Teeth and Oropharynx as per pre-operative assessment  Difficulty Due To: Difficulty was unanticipated

## 2014-06-02 NOTE — H&P (Signed)
Nathaniel Hartman is an 64 y.o. male.   Chief Complaint: sinonasal polyposis HPI: Chronic polyps and sinusitis  Past Medical History  Diagnosis Date  . Bilateral pneumonia 07/31/2007  . Asthma   . Chronic cough   . Pansinusitis   . COPD (chronic obstructive pulmonary disease)   . Hypertension   . Shortness of breath   . Chronic kidney disease   . GERD (gastroesophageal reflux disease)     pt denies this (05/31/14)    Past Surgical History  Procedure Laterality Date  . Left hand surgery  08/2006  . Eye surgery Right 2008    cataract surgery    History reviewed. No pertinent family history. Social History:  reports that he quit smoking about 17 years ago. He has never used smokeless tobacco. He reports that he does not drink alcohol or use illicit drugs.  Allergies:  Allergies  Allergen Reactions  . Benadryl [Diphenhydramine Hcl (Sleep)] Shortness Of Breath  . Motrin [Ibuprofen] Shortness Of Breath  . Nsaids Shortness Of Breath    Medications Prior to Admission  Medication Sig Dispense Refill  . albuterol (PROVENTIL HFA;VENTOLIN HFA) 108 (90 BASE) MCG/ACT inhaler Inhale 2 puffs into the lungs every 6 (six) hours as needed for wheezing. 1 Inhaler 11  . albuterol (PROVENTIL) (2.5 MG/3ML) 0.083% nebulizer solution Take 3 mLs (2.5 mg total) by nebulization every 4 (four) hours as needed for wheezing or shortness of breath. 75 mL 3  . azithromycin (ZITHROMAX) 500 MG tablet Take 1 tablet (500 mg total) by mouth daily. For 3 more days 3 tablet 0  . budesonide-formoterol (SYMBICORT) 160-4.5 MCG/ACT inhaler Take 2 puffs first thing in am and then another 2 puffs about 12 hours later. 1 Inhaler 2  . cefpodoxime (VANTIN) 200 MG tablet Take 1 tablet (200 mg total) by mouth every 12 (twelve) hours. For 4 more days 8 tablet 0  . Famotidine (PEPCID PO) Take 1 tablet by mouth at bedtime.     . fluticasone (FLONASE) 50 MCG/ACT nasal spray Place 2 sprays into both nostrils daily. 16 g 2  .  loratadine (CLARITIN) 10 MG tablet Take 1 tablet (10 mg total) by mouth daily. 30 tablet 0  . predniSONE (DELTASONE) 20 MG tablet Take 3 tablets once daily for 3 days, then take 2 tablets once daily for 3 days, then take 1 tablet once daily for 3 days, then STOP. (Patient not taking: Reported on 05/26/2014) 18 tablet 0    Results for orders placed or performed during the hospital encounter of 05/31/14 (from the past 48 hour(s))  Basic metabolic panel     Status: Abnormal   Collection Time: 05/31/14  8:53 AM  Result Value Ref Range   Sodium 139 135 - 145 mmol/L   Potassium 4.0 3.5 - 5.1 mmol/L   Chloride 106 96 - 112 mmol/L   CO2 25 19 - 32 mmol/L   Glucose, Bld 94 70 - 99 mg/dL   BUN 27 (H) 6 - 23 mg/dL   Creatinine, Ser 1.11 0.50 - 1.35 mg/dL   Calcium 8.9 8.4 - 10.5 mg/dL   GFR calc non Af Amer 68 (L) >90 mL/min   GFR calc Af Amer 79 (L) >90 mL/min    Comment: (NOTE) The eGFR has been calculated using the CKD EPI equation. This calculation has not been validated in all clinical situations. eGFR's persistently <90 mL/min signify possible Chronic Kidney Disease.    Anion gap 8 5 - 15  CBC  Status: Abnormal   Collection Time: 05/31/14  8:53 AM  Result Value Ref Range   WBC 4.4 4.0 - 10.5 K/uL   RBC 4.52 4.22 - 5.81 MIL/uL   Hemoglobin 11.9 (L) 13.0 - 17.0 g/dL   HCT 37.2 (L) 39.0 - 52.0 %   MCV 82.3 78.0 - 100.0 fL   MCH 26.3 26.0 - 34.0 pg   MCHC 32.0 30.0 - 36.0 g/dL   RDW 15.7 (H) 11.5 - 15.5 %   Platelets 257 150 - 400 K/uL   No results found.  Review of Systems  Constitutional: Negative.   HENT: Positive for congestion.   Respiratory: Positive for cough and shortness of breath.   Cardiovascular: Negative.     Blood pressure 139/96, pulse 71, temperature 97.4 F (36.3 C), temperature source Oral, resp. rate 18, weight 74.39 kg (164 lb), SpO2 100 %. Physical Exam  Constitutional: He is oriented to person, place, and time. He appears well-developed and  well-nourished.  HENT:  Patent ant nasal passageway  Neck: Normal range of motion. Neck supple.  Cardiovascular: Normal rate.   Respiratory: Effort normal.  GI: Soft.  Musculoskeletal: Normal range of motion.  Neurological: He is alert and oriented to person, place, and time.     Assessment/Plan Adm for OP ESS under GA.  Leith Szafranski, [place] 06/02/2014, 8:28 AM

## 2014-06-03 NOTE — Op Note (Signed)
NAME:  Nathaniel Hartman, Nathaniel Hartman               ACCOUNT NO.:  [id number]  MEDICAL RECORD NO.:  [id number]  LOCATION:                                FACILITY:  [facility]  PHYSICIAN:  Kinnie Scalesavid L. Annalee GentaShoemaker, M.D.DATE OF BIRTH:  11/12/1950  DATE OF PROCEDURE:  06/02/2014 DATE OF DISCHARGE:  06/02/2014                              OPERATIVE REPORT   [facility]ATION:  [facility].  PREOPERATIVE DIAGNOSES: 1. Chronic sinusitis. 2. Chronic nasal polyposis. 3. Reactive airway disease.  POSTOPERATIVE DIAGNOSES: 1. Chronic sinusitis. 2. Chronic nasal polyposis. 3. Reactive airway disease.  INDICATION FOR SURGERY: 1. Chronic sinusitis. 2. Chronic nasal polyposis. 3. Reactive airway disease.  SURGICAL PROCEDURE:  Bilateral endoscopic sinus surgery with intraoperative computer-assisted navigation (fusion) consisting of bilateral total ethmoidectomies, bilateral maxillary antrostomies with removal of diseased tissue and bilateral nasofrontal recess exploration.  ANESTHESIA:  General endotracheal.  SURGEON:  Kinnie Scalesavid L. Annalee GentaShoemaker, MD.  COMPLICATIONS:  None.  ESTIMATED BLOOD LOSS:  Approximately 200 mL.  DISPOSITION:  The patient transferred from the operating room to the recovery room in stable condition.  FINDINGS:  Extensive nasal polyposis involving the maxillary ethmoid and frontal sinuses bilaterally with opacification and thick mucinous discharge, sinuses cleared of polyp disease, and at the conclusion of the surgical procedure Bactroban/Kenalog slurry instilled in all sinuses.  Bilateral Kennedy sinus packs placed in the common ethmoid cavities bilaterally.  BRIEF HISTORY:  The patient is a 64 year old male who was referred to our office with a history of chronic sinusitis, recurrent pneumonia, and reactive airway disease.  The patient had been worked up by the pulmonary physicians and found to have extensive sinonasal disease on CT scan.  He was treated with multiple broad-spectrum  antibiotics, oral and topical steroids and saline irrigation with limited improvement in symptoms.  Concerns raised regarding possible exacerbation of reactive airway disease, COPD, and pneumonia because of chronic sinusitis.  After completing aggressive medical therapy,  a CT scan was obtained, which showed extensive polypoid disease involving the ethmoid maxillary and frontal sinuses bilaterally with opacification.  Given the patient's history and findings, I recommended the above surgical procedures.  The risks and benefits were discussed in detail and surgery was scheduled under general anesthesia at [facility] as an outpatient.  The patient understood the surgery, the risks and benefits and agreed with the surgical plan as proposed.  Surgery was scheduled on elective basis at [facility].  DESCRIPTION OF PROCEDURE:  The patient was brought to the operating room on June 02, 2014, and placed in supine position on the operating table. General endotracheal anesthesia was established without difficulty. When the patient was adequately anesthetized, he was positioned and then prepped and draped in a sterile fashion.  His nose was injected with a total of 6 mL of 1% lidocaine, 1:100,000 solution of epinephrine was injected in a submucosal fashion along the lateral nasal wall,  middle turbinate, and within the intranasal polyps bilaterally.  His nose was then packed with Afrin soaked cottonoid pledgets and were left in place for approximately 10 minutes to allow for vasoconstriction and hemostasis.  The Xomed fusion headgear was applied and anatomic and surgical landmarks were  identified and confirmed.  The fusion device was used throughout the endoscopic component of the surgical procedure.  With the patient prepared for surgery, a time-out was obtained and the surgical procedure was begun.  Begin on the patient's right-hand side, 0 degree nasal endoscopy  was performed using a straight microdebrider.  Polypoid material was resected from within the middle meatus and a total ethmoidectomy was performed along the inferior aspect of the ethmoid cavity dissecting from anterior to posterior.  The posterior and superior aspect of the ethmoid sinuses was identified and a 45-degree telescope and curved microdebrider was then used to dissect from posterior to anterior along the roof of the ethmoid sinus removing polypoid disease and bony septations.  The nasofrontal recess was identified.  This was completely occluded by polypoid disease and underlying ethmoid cells which were resected with the microdebrider.  The nasofrontal recess was cleared of polyps and polypoid disease from within the frontal sinus was removed with the microdebrider.  Attention was then turned to the lateral nasal wall, where the natural ostium of the maxillary sinus was completely occluded.  Residual uncinate process reflected anteriorly and resected. The natural ostium of the maxillary sinus was enlarged in a posterior and inferior direction.  Within the sinus, there was thick mucinous debris and polypoid material which was resected with a curved microdebrider.  Attention was then turned to the left hand side.  Again, using a 0- degree endoscope and a straight microdebrider, polypoid material was resected from within the middle meatus and the common ethmoid cavity. Dissection carried from anterior to posterior to clear the inferior aspect of the ethmoid sinuses.  Using a 45-degree telescope and a curved microdebrider with navigation, dissection was carried along the roof of the ethmoid from posterior to anterior removing polypoid mucosal disease.  The nasofrontal recess was identified.  The opening of the frontal sinus was completely occluded with polypoid disease which was resected with a curved microdebrider along the anterior and lateral aspects removing polyps from  within the left frontal sinus.  Attention was then turned to the lateral nasal wall, where the uncinate process was resected and the natural ostium of the maxillary sinus was identified.  It was enlarged in an inferior and posterior direction with a through-cutting forceps and polyps from within the left maxillary sinus were resected with a curved microdebrider creating a widely patent ostium and clearing the sinus disease.  The patient's nasal cavity was thoroughly inspected.  Surgical debris was cleared.  Sponge count was correct.  A 50:50 mix of Kenalog 40 and Bactroban cream was then instilled in the frontal ethmoid and maxillary sinuses bilaterally and bilateral Kennedy sinus packs were placed in the common ethmoid cavity.  The nasopharynx was irrigated and suctioned.  An orogastric tube was passed and stomach contents were aspirated.  The patient was then awakened from his anesthetic.  He was extubated and transferred from the operating room to the recovery room in stable condition.  There were no complications. Estimated blood loss was approximately 200 mL.          ______________________________ Kinnie Scales. Annalee Genta, M.D.     DLS/MEDQ  D:  16/11/9602  T:  06/03/2014  Job:  [id number]

## 2014-06-03 NOTE — Anesthesia Postprocedure Evaluation (Signed)
  Anesthesia Post-op Note  Patient: Nathaniel Hartman  Procedure(s) Performed: Procedure(s): BILATERAL ENDOSCOPIC SINUS SURGERY WITH FUSION  (Bilateral) POLYPECTOMY NASAL (N/A)  Patient Location: PACU  Anesthesia Type:General  Level of Consciousness: awake and alert   Airway and Oxygen Therapy: Patient Spontanous Breathing  Post-op Pain: mild  Post-op Assessment: Post-op Vital signs reviewed  Post-op Vital Signs: Reviewed  Last Vitals:  Filed Vitals:   06/02/14 1334  BP: 142/98  Pulse: 72  Temp:   Resp:     Complications: No apparent anesthesia complications

## 2014-06-06 ENCOUNTER — Encounter (HOSPITAL_COMMUNITY): Payer: Self-pay | Admitting: Otolaryngology

## 2014-07-05 ENCOUNTER — Other Ambulatory Visit: Payer: Self-pay | Admitting: Internal Medicine

## 2014-07-05 MED ORDER — BUDESONIDE-FORMOTEROL FUMARATE 160-4.5 MCG/ACT IN AERO: 2.0000 | INHALATION_SPRAY | Freq: Two times a day (BID) | RESPIRATORY_TRACT | Status: DC

## 2014-07-05 NOTE — Telephone Encounter (Signed)
Patient called requesting medication refill on budesonide-formoterol (SYMBICORT) 160-4.5 MCG/ACT inhaler . Patient uses [facility] pharmacy, please f/u

## 2014-07-05 NOTE — Telephone Encounter (Signed)
Prescription done

## 2014-08-08 ENCOUNTER — Telehealth: Payer: Self-pay

## 2014-08-08 ENCOUNTER — Telehealth: Payer: Self-pay | Admitting: Internal Medicine

## 2014-08-08 MED ORDER — BUDESONIDE-FORMOTEROL FUMARATE 160-4.5 MCG/ACT IN AERO: 2.0000 | INHALATION_SPRAY | Freq: Two times a day (BID) | RESPIRATORY_TRACT | Status: DC

## 2014-08-08 NOTE — Telephone Encounter (Signed)
Returned patient phone call Patient not available Left message on voice mail to return our call 

## 2014-08-08 NOTE — Telephone Encounter (Signed)
Pt's son calling on behalf of pt to request refill on budesonide-formoterol (SYMBICORT) 160-4.5 MCG/ACT inhaler. Please f/u with pt.

## 2014-08-09 ENCOUNTER — Telehealth: Payer: Self-pay

## 2014-08-09 NOTE — Telephone Encounter (Signed)
Returned patient phone call and his son is aware That his father's prescription is ready for pick up in the pharmacy

## 2014-09-23 ENCOUNTER — Telehealth: Payer: Self-pay | Admitting: Internal Medicine

## 2014-09-23 NOTE — Telephone Encounter (Signed)
Patient called to request a med refill for budesonide-formoterol (SYMBICORT) 160-4.5 MCG/ACT inhaler. Please f/u with pt.  °

## 2014-10-12 ENCOUNTER — Telehealth: Payer: Self-pay | Admitting: Internal Medicine

## 2014-10-12 DIAGNOSIS — J449 Chronic obstructive pulmonary disease, unspecified: Secondary | ICD-10-CM

## 2014-10-12 NOTE — Telephone Encounter (Signed)
Patient called requesting medication refill on budesonide-formoterol (SYMBICORT) 160-4.5 MCG/ACT inhaler, please f/u

## 2014-10-13 MED ORDER — BUDESONIDE-FORMOTEROL FUMARATE 160-4.5 MCG/ACT IN AERO: INHALATION_SPRAY | RESPIRATORY_TRACT | Status: DC

## 2015-01-14 ENCOUNTER — Ambulatory Visit (INDEPENDENT_AMBULATORY_CARE_PROVIDER_SITE_OTHER): Payer: Commercial Managed Care - HMO | Admitting: Family Medicine

## 2015-01-14 VITALS — BP 102/60 | HR 93 | Temp 97.7°F | Resp 12 | Ht 73.25 in | Wt 157.6 lb

## 2015-01-14 DIAGNOSIS — J209 Acute bronchitis, unspecified: Secondary | ICD-10-CM

## 2015-01-14 DIAGNOSIS — J069 Acute upper respiratory infection, unspecified: Secondary | ICD-10-CM | POA: Diagnosis not present

## 2015-01-14 DIAGNOSIS — J439 Emphysema, unspecified: Secondary | ICD-10-CM | POA: Diagnosis not present

## 2015-01-14 DIAGNOSIS — J329 Chronic sinusitis, unspecified: Secondary | ICD-10-CM

## 2015-01-14 DIAGNOSIS — N189 Chronic kidney disease, unspecified: Secondary | ICD-10-CM | POA: Diagnosis not present

## 2015-01-14 LAB — POCT CBC
GRANULOCYTE PERCENT: 80.4 % — AB (ref 37–80)
HCT, POC: 37.7 % — AB (ref 43.5–53.7)
HEMOGLOBIN: 12.4 g/dL — AB (ref 14.1–18.1)
Lymph, poc: 1.1 (ref 0.6–3.4)
MCH, POC: 27.1 pg (ref 27–31.2)
MCHC: 32.8 g/dL (ref 31.8–35.4)
MCV: 82.7 fL (ref 80–97)
MID (cbc): 0.7 (ref 0–0.9)
MPV: 6.6 fL (ref 0–99.8)
PLATELET COUNT, POC: 209 10*3/uL (ref 142–424)
POC GRANULOCYTE: 7.6 — AB (ref 2–6.9)
POC LYMPH PERCENT: 12 %L (ref 10–50)
POC MID %: 7.6 %M (ref 0–12)
RBC: 4.56 M/uL — AB (ref 4.69–6.13)
RDW, POC: 14.7 %
WBC: 9.4 10*3/uL (ref 4.6–10.2)

## 2015-01-14 LAB — BASIC METABOLIC PANEL
BUN: 25 mg/dL (ref 7–25)
CALCIUM: 8.5 mg/dL — AB (ref 8.6–10.3)
CO2: 24 mmol/L (ref 20–31)
Chloride: 98 mmol/L (ref 98–110)
Creat: 1.71 mg/dL — ABNORMAL HIGH (ref 0.70–1.25)
Glucose, Bld: 106 mg/dL — ABNORMAL HIGH (ref 65–99)
POTASSIUM: 4 mmol/L (ref 3.5–5.3)
Sodium: 128 mmol/L — ABNORMAL LOW (ref 135–146)

## 2015-01-14 MED ORDER — AMOXICILLIN-POT CLAVULANATE 500-125 MG PO TABS: 1.0000 | ORAL_TABLET | Freq: Two times a day (BID) | ORAL | Status: DC

## 2015-01-14 MED ORDER — HYDROCODONE-HOMATROPINE 5-1.5 MG/5ML PO SYRP: 5.0000 mL | ORAL_SOLUTION | ORAL | Status: DC | PRN

## 2015-01-14 NOTE — Patient Instructions (Signed)
Drink plenty of fluids and get enough rest  Take the Augmentin (amoxicillin/clavulanate) 875 mg twice daily  Take the Hycodan cough syrup 1 teaspoon every 4-6 hours as needed for cough. It will tend to cause drowsiness so best taken in the evening and nighttime.  It is fine to continue using the Mucinex DM in the daytime if you wish, but don't take it at the same time as the Hycodan at night.  Return if further problems  I will let you know the results of your blood chemistries in the next few days

## 2015-01-14 NOTE — Progress Notes (Signed)
Patient ID: Nathaniel Hartman, male    DOB: October 05, 1950  Age: 64 y.o. MRN: [id number]  Chief Complaint  Patient presents with  . Cough    Productive, colors range from yellow to orange  . Nasal Congestion  . Chills  . Anorexia  . Headache    describes as pressure  . Other    Patient states he has low gfr levels awaiting nephrology appt to rule out kidney disease.    Subjective:   64 year old gentleman who speaks some [place] American, whose son who is an Therapist, sports is also here to help interpret. The patient has a history of emphysema, probably from chemical exposure in the job place. He is currently retired. He had been doing fairly well on his chronic inhalers until a few days ago. He developed a respiratory tract infection with head congestion and pressure and headache and cough with purulent productivity. He has not been running a documented fever. He has a cataract developing on his left eye, and recent laboratory tests showed a decreased GFR. He is awaiting referral to the nephrologist an ophthalmologist.  Current allergies, medications, problem list, past/family and social histories reviewed.  Objective:  BP 102/60 mmHg  Pulse 93  Temp(Src) 97.7 F (36.5 C) (Oral)  Resp 12  Ht 6' 1.25" (1.861 m)  Wt 157 lb 9.6 oz (71.487 kg)  BMI 20.64 kg/m2  SpO2 94%  No major acute distress. Pleasant and alert. TMs normal. Throat has a little postnasal drainage. Otherwise unremarkable. Neck supple without significant nodes. Chest is clear to auscultation. Heart regular without murmurs.  Assessment & Plan:   Assessment: 1. Acute upper respiratory infection   2. Chronic sinusitis, unspecified location   3. Acute bronchitis, unspecified organism   4. CKD (chronic kidney disease), unspecified stage   5. Pulmonary emphysema, unspecified emphysema type ([place])       Plan: The cause of the chronic sinusitis and history of recent sinus surgery and the COPD with purulent productivity, will  go ahead and treat with a round of antibiotics. Decided to check CBC and be met since his kidney function had diminished fairly quickly this summer.  Orders Placed This Encounter  Procedures  . Basic metabolic panel  . POCT CBC    Meds ordered this encounter  Medications  . fluticasone (FLONASE) 50 MCG/ACT nasal spray    Sig: Place into both nostrils daily.  Marland Kitchen HYDROcodone-homatropine (HYCODAN) 5-1.5 MG/5ML syrup    Sig: Take 5 mLs by mouth every 4 (four) hours as needed.    Dispense:  120 mL    Refill:  0  . amoxicillin-clavulanate (AUGMENTIN) 500-125 MG tablet    Sig: Take 1 tablet (500 mg total) by mouth 2 (two) times daily.    Dispense:  20 tablet    Refill:  0    Results for orders placed or performed in visit on 01/14/15  POCT CBC  Result Value Ref Range   WBC 9.4 4.6 - 10.2 K/uL   Lymph, poc 1.1 0.6 - 3.4   POC LYMPH PERCENT 12.0 10 - 50 %L   MID (cbc) 0.7 0 - 0.9   POC MID % 7.6 0 - 12 %M   POC Granulocyte 7.6 (A) 2 - 6.9   Granulocyte percent 80.4 (A) 37 - 80 %G   RBC 4.56 (A) 4.69 - 6.13 M/uL   Hemoglobin 12.4 (A) 14.1 - 18.1 g/dL   HCT, POC 37.7 (A) 43.5 - 53.7 %   MCV 82.7 80 - 97  fL   MCH, POC 27.1 27 - 31.2 pg   MCHC 32.8 31.8 - 35.4 g/dL   RDW, POC 14.7 %   Platelet Count, POC 209 142 - 424 K/uL   MPV 6.6 0 - 99.8 fL        Patient Instructions  Drink plenty of fluids and get enough rest  Take the Augmentin (amoxicillin/clavulanate) 875 mg twice daily  Take the Hycodan cough syrup 1 teaspoon every 4-6 hours as needed for cough. It will tend to cause drowsiness so best taken in the evening and nighttime.  It is fine to continue using the Mucinex DM in the daytime if you wish, but don't take it at the same time as the Hycodan at night.  Return if further problems  I will let you know the results of your blood chemistries in the next few days     Return if symptoms worsen or fail to improve.   HOPPER,Manolito, MD 01/14/2015

## 2015-03-07 MED FILL — SYMBICORT 160-4.5 MCG INH: 160-4.5 | 30 days supply | Qty: 10 | Fill #0

## 2015-03-10 MED FILL — VENTOLIN HFA 90 MCG INHALER: 108 (90 BAS | 25 days supply | Qty: 18 | Fill #0

## 2015-03-10 MED FILL — FLUTICASONE PROP 50 MCG SPR: 50 | 30 days supply | Qty: 16 | Fill #0

## 2015-04-07 MED FILL — SYMBICORT 160-4.5 MCG INH: 160-4.5 | 30 days supply | Qty: 10 | Fill #1

## 2015-05-08 MED FILL — SYMBICORT 160-4.5 MCG INH: 160-4.5 | 30 days supply | Qty: 10 | Fill #2

## 2015-06-06 MED FILL — SYMBICORT 160-4.5 MCG INH: 160-4.5 | 30 days supply | Qty: 10 | Fill #1

## 2015-06-06 MED FILL — VENTOLIN HFA 90 MCG INHALER: 108 (90 BAS | 30 days supply | Qty: 18 | Fill #0

## 2015-06-06 MED FILL — FLUTICASONE PROP 50 MCG SPR: 50 | 30 days supply | Qty: 16 | Fill #0

## 2015-07-05 MED FILL — SYMBICORT 160-4.5 MCG INH: 160-4.5 | 30 days supply | Qty: 10 | Fill #0

## 2015-08-07 MED FILL — SYMBICORT 160-4.5 MCG INH: 160-4.5 | 30 days supply | Qty: 10 | Fill #1

## 2015-09-13 MED FILL — SYMBICORT 160-4.5 MCG INH: 160-4.5 | 30 days supply | Qty: 10 | Fill #2

## 2015-11-27 MED FILL — SYMBICORT 160-4.5 MCG INH: 160-4.5 | 30 days supply | Qty: 10 | Fill #3

## 2015-12-27 MED FILL — SYMBICORT 160-4.5 MCG INH: 160-4.5 | 30 days supply | Qty: 10 | Fill #4

## 2016-01-23 MED FILL — SYMBICORT 160-4.5 MCG INH: 160-4.5 | 30 days supply | Qty: 10 | Fill #5

## 2016-02-27 MED FILL — SYMBICORT 160-4.5 MCG INH: 160-4.5 | 30 days supply | Qty: 10 | Fill #0

## 2016-04-02 MED FILL — SYMBICORT 160-4.5 MCG INH: 160-4.5 | 30 days supply | Qty: 10 | Fill #1

## 2016-05-01 MED FILL — SYMBICORT 160-4.5 MCG INH: 160-4.5 | 30 days supply | Qty: 10 | Fill #2

## 2016-09-17 ENCOUNTER — Encounter: Payer: Self-pay | Admitting: Specialist

## 2017-03-20 ENCOUNTER — Encounter: Payer: Self-pay | Admitting: Specialist

## 2020-11-06 ENCOUNTER — Ambulatory Visit (INDEPENDENT_AMBULATORY_CARE_PROVIDER_SITE_OTHER): Payer: Medicare HMO | Admitting: Family Medicine

## 2020-11-06 ENCOUNTER — Other Ambulatory Visit: Payer: Self-pay

## 2020-11-06 ENCOUNTER — Encounter: Payer: Self-pay | Admitting: Family Medicine

## 2020-11-06 VITALS — BP 149/99 | HR 79 | Temp 98.3°F | Resp 16 | Ht 73.0 in | Wt 155.2 lb

## 2020-11-06 DIAGNOSIS — Z23 Encounter for immunization: Secondary | ICD-10-CM

## 2020-11-06 DIAGNOSIS — J4540 Moderate persistent asthma, uncomplicated: Secondary | ICD-10-CM

## 2020-11-06 DIAGNOSIS — L309 Dermatitis, unspecified: Secondary | ICD-10-CM

## 2020-11-06 DIAGNOSIS — Z7689 Persons encountering health services in other specified circumstances: Secondary | ICD-10-CM

## 2020-11-06 DIAGNOSIS — I10 Essential (primary) hypertension: Secondary | ICD-10-CM

## 2020-11-06 DIAGNOSIS — J309 Allergic rhinitis, unspecified: Secondary | ICD-10-CM | POA: Diagnosis not present

## 2020-11-06 MED ORDER — CETIRIZINE HCL 10 MG PO TABS: 10.0000 mg | ORAL_TABLET | Freq: Every day | ORAL | 11 refills | Status: DC

## 2020-11-06 MED ORDER — LISINOPRIL 10 MG PO TABS: 10.0000 mg | ORAL_TABLET | Freq: Every day | ORAL | 1 refills | Status: DC

## 2020-11-06 MED ORDER — BUDESONIDE-FORMOTEROL FUMARATE 160-4.5 MCG/ACT IN AERO: 2.0000 | INHALATION_SPRAY | Freq: Two times a day (BID) | RESPIRATORY_TRACT | 12 refills | Status: DC

## 2020-11-06 MED ORDER — TRIAMCINOLONE ACETONIDE 0.1 % EX CREA: 1.0000 "application " | TOPICAL_CREAM | Freq: Two times a day (BID) | CUTANEOUS | 1 refills | Status: DC

## 2020-11-07 ENCOUNTER — Ambulatory Visit: Payer: Commercial Managed Care - HMO | Admitting: Family Medicine

## 2020-11-07 LAB — AST: AST: 21 IU/L (ref 0–40)

## 2020-11-07 LAB — BASIC METABOLIC PANEL
BUN/Creatinine Ratio: 14 (ref 10–24)
BUN: 18 mg/dL (ref 8–27)
CO2: 23 mmol/L (ref 20–29)
Calcium: 8.6 mg/dL (ref 8.6–10.2)
Chloride: 103 mmol/L (ref 96–106)
Creatinine, Ser: 1.3 mg/dL — ABNORMAL HIGH (ref 0.76–1.27)
Glucose: 112 mg/dL — ABNORMAL HIGH (ref 70–99)
Potassium: 4.7 mmol/L (ref 3.5–5.2)
Sodium: 139 mmol/L (ref 134–144)
eGFR: 58 mL/min/{1.73_m2} — ABNORMAL LOW (ref >59)

## 2020-11-07 LAB — LIPID PANEL
Chol/HDL Ratio: 3.5 ratio (ref 0.0–5.0)
Cholesterol, Total: 152 mg/dL (ref 100–199)
HDL: 43 mg/dL (ref >39)
LDL Chol Calc (NIH): 87 mg/dL (ref 0–99)
Triglycerides: 120 mg/dL (ref 0–149)
VLDL Cholesterol Cal: 22 mg/dL (ref 5–40)

## 2020-11-07 LAB — ALT: ALT: 14 IU/L (ref 0–44)

## 2020-11-07 NOTE — Progress Notes (Signed)
New Patient Office Visit  Subjective:  Patient ID: Nathaniel Hartman, male    DOB: Jul 09, 1950  Age: 70 y.o. MRN: [id number]  CC:  Chief Complaint  Patient presents with   Establish Care   Annual Exam   Asthma   Medication Refill    HPI Nathaniel Hartman presents for to establish care and for review of chronic medical issues with med refills.  Patient complains about a rash on legs.  Reports that it does not itch or hurt.  Denies known exposure.  Patient also reports some increasing allergy symptoms over the past several days.  Denies fever chills or viral symptoms.  This visit was aided by an interpreter.  Past Medical History:  Diagnosis Date   Asthma    Bilateral pneumonia 07/31/2007   Chronic cough    Chronic kidney disease    COPD (chronic obstructive pulmonary disease) (HCC)    GERD (gastroesophageal reflux disease)    pt denies this (05/31/14)   Hypertension    Pansinusitis    Shortness of breath     Past Surgical History:  Procedure Laterality Date   EYE SURGERY Right 2008   cataract surgery   left hand surgery  08/2006   NASAL SINUS SURGERY Bilateral 06/02/2014   Procedure: BILATERAL ENDOSCOPIC SINUS SURGERY WITH FUSION ;  Surgeon: Osborn Coho, MD;  Location: [facility] OR;  Service: ENT;  Laterality: Bilateral;   POLYPECTOMY N/A 06/02/2014   Procedure: POLYPECTOMY NASAL;  Surgeon: Osborn Coho, MD;  Location: [facility] OR;  Service: ENT;  Laterality: N/A;    No family history on file.  Social History   Socioeconomic History   Marital status: Single    Spouse name: Not on file   Number of children: Not on file   Years of education: Not on file   Highest education level: Not on file  Occupational History   Occupation: metal work  Tobacco Use   Smoking status: Former    Packs/day: 0.50    Years: 4.00    Pack years: 2.00    Types: Cigarettes    Quit date: 02/11/1997    Years since quitting: 23.7   Smokeless tobacco: Never  Substance and Sexual Activity   Alcohol use:  No   Drug use: No   Sexual activity: Not on file  Other Topics Concern   Not on file  Social History Narrative   Not on file   Social Determinants of Health   Financial Resource Strain: Not on file  Food Insecurity: Not on file  Transportation Needs: Not on file  Physical Activity: Not on file  Stress: Not on file  Social Connections: Not on file  Intimate Partner Violence: Not on file    ROS Review of Systems  Skin:  Positive for rash.  All other systems reviewed and are negative.  Objective:   Today's Vitals: BP (!) 149/99 (BP Location: Right Arm, Patient Position: Sitting, Cuff Size: Large)   Pulse 79   Temp 98.3 F (36.8 C) (Oral)   Resp 16   Ht 6\' 1"  (1.854 m)   Wt 155 lb 3.2 oz (70.4 kg)   SpO2 94%   BMI 20.48 kg/m   Physical Exam Vitals and nursing note reviewed.  Constitutional:      General: He is not in acute distress. Cardiovascular:     Rate and Rhythm: Normal rate and regular rhythm.  Pulmonary:     Effort: Pulmonary effort is normal.     Breath sounds: Normal breath sounds.  Abdominal:     Palpations: Abdomen is soft.     Tenderness: There is no abdominal tenderness.  Musculoskeletal:     Right lower leg: No edema.     Left lower leg: No edema.  Skin:    Findings: Rash (lower legs) present.  Neurological:     General: No focal deficit present.     Mental Status: He is alert and oriented to person, place, and time.    Assessment & Plan:   1. Essential hypertension Elevated readings.  Lisinopril 10 mg daily prescribed.  Monitoring labs ordered.  Will monitor. - lisinopril (ZESTRIL) 10 MG tablet; Take 1 tablet (10 mg total) by mouth daily.  Dispense: 30 tablet; Refill: 1 - Basic Metabolic Panel - Lipid Panel - AST - ALT  2. Moderate persistent asthma without complication Appears stable.  Symbicort refilled. - budesonide-formoterol (SYMBICORT) 160-4.5 MCG/ACT inhaler; Inhale 2 puffs into the lungs 2 (two) times daily.  Dispense: 1 each;  Refill: 12  3. Allergic rhinitis, unspecified seasonality, unspecified trigger Zyrtec 10 mg 1 p.o. daily prescribed. - cetirizine (ZYRTEC) 10 MG tablet; Take 1 tablet (10 mg total) by mouth daily.  Dispense: 30 tablet; Refill: 11  4. Dermatitis Probable stasis dermatitis.  Triamcinolone cream given.  Recommended to patient to utilize compression hosiery if possible.  Will monitor - triamcinolone cream (KENALOG) 0.1 %; Apply 1 application topically 2 (two) times daily.  Dispense: 80 g; Refill: 1  5. Encounter to establish care      Outpatient Encounter Medications as of 11/06/2020  Medication Sig   albuterol (PROVENTIL HFA;VENTOLIN HFA) 108 (90 BASE) MCG/ACT inhaler Inhale 2 puffs into the lungs every 6 (six) hours as needed for wheezing.   albuterol (PROVENTIL) (2.5 MG/3ML) 0.083% nebulizer solution Take 3 mLs (2.5 mg total) by nebulization every 4 (four) hours as needed for wheezing or shortness of breath.   cetirizine (ZYRTEC) 10 MG tablet Take 1 tablet (10 mg total) by mouth daily.   lisinopril (ZESTRIL) 10 MG tablet Take 1 tablet (10 mg total) by mouth daily.   loratadine (CLARITIN) 10 MG tablet Take 1 tablet (10 mg total) by mouth daily.   triamcinolone cream (KENALOG) 0.1 % Apply 1 application topically 2 (two) times daily.   [DISCONTINUED] budesonide-formoterol (SYMBICORT) 160-4.5 MCG/ACT inhaler Inhale 2 puffs into the lungs 2 (two) times daily.   amoxicillin-clavulanate (AUGMENTIN) 500-125 MG tablet Take 1 tablet (500 mg total) by mouth 2 (two) times daily. (Patient not taking: Reported on 11/06/2020)   budesonide-formoterol (SYMBICORT) 160-4.5 MCG/ACT inhaler Inhale 2 puffs into the lungs 2 (two) times daily.   Famotidine (PEPCID PO) Take 1 tablet by mouth at bedtime.  (Patient not taking: Reported on 11/06/2020)   fluticasone (FLONASE) 50 MCG/ACT nasal spray Place into both nostrils daily. (Patient not taking: Reported on 11/06/2020)   HYDROcodone-acetaminophen (NORCO) 5-325 MG  per tablet Take 1-2 tablets by mouth every 6 (six) hours as needed. (Patient not taking: Reported on 11/06/2020)   HYDROcodone-homatropine (HYCODAN) 5-1.5 MG/5ML syrup Take 5 mLs by mouth every 4 (four) hours as needed. (Patient not taking: Reported on 11/06/2020)   [DISCONTINUED] cetirizine (ZYRTEC) 10 MG tablet Take 10 mg by mouth at bedtime as needed.     [DISCONTINUED] omeprazole (PRILOSEC OTC) 20 MG tablet Take 20 mg by mouth daily.     No facility-administered encounter medications on file as of 11/06/2020.    Follow-up: Return in about 4 weeks (around 12/04/2020) for follow up.   [name], MD

## 2020-12-05 ENCOUNTER — Ambulatory Visit (INDEPENDENT_AMBULATORY_CARE_PROVIDER_SITE_OTHER): Payer: Medicare HMO | Admitting: Family Medicine

## 2020-12-05 ENCOUNTER — Other Ambulatory Visit: Payer: Self-pay

## 2020-12-05 ENCOUNTER — Encounter: Payer: Self-pay | Admitting: Family Medicine

## 2020-12-05 VITALS — BP 135/94 | HR 81 | Temp 97.7°F | Resp 16 | Wt 155.4 lb

## 2020-12-05 DIAGNOSIS — I10 Essential (primary) hypertension: Secondary | ICD-10-CM | POA: Diagnosis not present

## 2020-12-05 MED ORDER — LISINOPRIL 20 MG PO TABS: 20.0000 mg | ORAL_TABLET | Freq: Every day | ORAL | 0 refills | Status: DC

## 2020-12-05 NOTE — Progress Notes (Signed)
Patient has no new concerns today. Patient is here for f/up visit

## 2020-12-06 ENCOUNTER — Encounter: Payer: Self-pay | Admitting: Family Medicine

## 2020-12-06 NOTE — Progress Notes (Signed)
Established Patient Office Visit  Subjective:  Patient ID: Nathaniel Hartman, male    DOB: 1950/06/15  Age: 70 y.o. MRN: [id number]  CC: No chief complaint on file.   HPI Nathaniel Hartman presents for follow up of hypertension. Patient denies acute complaints or concerns. This office visit was aided by an interpreter.   Past Medical History:  Diagnosis Date   Asthma    Bilateral pneumonia 07/31/2007   Chronic cough    Chronic kidney disease    COPD (chronic obstructive pulmonary disease) (HCC)    GERD (gastroesophageal reflux disease)    pt denies this (05/31/14)   Hypertension    Pansinusitis    Shortness of breath      Social History   Socioeconomic History   Marital status: Single    Spouse name: Not on file   Number of children: Not on file   Years of education: Not on file   Highest education level: Not on file  Occupational History   Occupation: metal work  Tobacco Use   Smoking status: Former    Packs/day: 0.50    Years: 4.00    Pack years: 2.00    Types: Cigarettes    Quit date: 02/11/1997    Years since quitting: 23.8   Smokeless tobacco: Never  Substance and Sexual Activity   Alcohol use: No   Drug use: No   Sexual activity: Not on file  Other Topics Concern   Not on file  Social History Narrative   Not on file   Social Determinants of Health   Financial Resource Strain: Not on file  Food Insecurity: Not on file  Transportation Needs: Not on file  Physical Activity: Not on file  Stress: Not on file  Social Connections: Not on file  Intimate Partner Violence: Not on file    ROS Review of Systems  All other systems reviewed and are negative.  Objective:   Today's Vitals: BP (!) 135/94 (BP Location: Right Arm, Patient Position: Sitting, Cuff Size: Large)   Pulse 81   Temp 97.7 F (36.5 C) (Oral)   Resp 16   Wt 155 lb 7.2 oz (70.5 kg)   BMI 20.51 kg/m   Physical Exam Vitals and nursing note reviewed.  Constitutional:      General: He is  not in acute distress. Cardiovascular:     Rate and Rhythm: Normal rate and regular rhythm.  Pulmonary:     Effort: Pulmonary effort is normal.     Breath sounds: Normal breath sounds.  Abdominal:     Palpations: Abdomen is soft.     Tenderness: There is no abdominal tenderness.  Musculoskeletal:     Right lower leg: No edema.     Left lower leg: No edema.  Neurological:     General: No focal deficit present.     Mental Status: He is alert and oriented to person, place, and time.    Assessment & Plan:   1. Essential hypertension Improved reading but still slightly elevated. Will increase zesteril form 10 mg to 20 mg daily and monitor.     Outpatient Encounter Medications as of 12/05/2020  Medication Sig   albuterol (PROVENTIL HFA;VENTOLIN HFA) 108 (90 BASE) MCG/ACT inhaler Inhale 2 puffs into the lungs every 6 (six) hours as needed for wheezing.   albuterol (PROVENTIL) (2.5 MG/3ML) 0.083% nebulizer solution Take 3 mLs (2.5 mg total) by nebulization every 4 (four) hours as needed for wheezing or shortness of breath.   amoxicillin-clavulanate (AUGMENTIN)  500-125 MG tablet Take 1 tablet (500 mg total) by mouth 2 (two) times daily.   budesonide-formoterol (SYMBICORT) 160-4.5 MCG/ACT inhaler Inhale 2 puffs into the lungs 2 (two) times daily.   cetirizine (ZYRTEC) 10 MG tablet Take 1 tablet (10 mg total) by mouth daily.   Famotidine (PEPCID PO) Take 1 tablet by mouth at bedtime.   fluticasone (FLONASE) 50 MCG/ACT nasal spray Place into both nostrils daily.   HYDROcodone-acetaminophen (NORCO) 5-325 MG per tablet Take 1-2 tablets by mouth every 6 (six) hours as needed.   HYDROcodone-homatropine (HYCODAN) 5-1.5 MG/5ML syrup Take 5 mLs by mouth every 4 (four) hours as needed.   lisinopril (ZESTRIL) 20 MG tablet Take 1 tablet (20 mg total) by mouth daily.   loratadine (CLARITIN) 10 MG tablet Take 1 tablet (10 mg total) by mouth daily.   triamcinolone cream (KENALOG) 0.1 % Apply 1  application topically 2 (two) times daily.   [DISCONTINUED] lisinopril (ZESTRIL) 10 MG tablet Take 1 tablet (10 mg total) by mouth daily.   [DISCONTINUED] omeprazole (PRILOSEC OTC) 20 MG tablet Take 20 mg by mouth daily.     No facility-administered encounter medications on file as of 12/05/2020.    Follow-up: Return in about 3 months (around 03/07/2021) for follow up, chronic med issues.   [name], MD

## 2021-02-26 ENCOUNTER — Other Ambulatory Visit: Payer: Self-pay | Admitting: Family Medicine

## 2021-03-12 ENCOUNTER — Other Ambulatory Visit: Payer: Self-pay

## 2021-03-12 ENCOUNTER — Encounter: Payer: Self-pay | Admitting: Family Medicine

## 2021-03-12 ENCOUNTER — Ambulatory Visit (INDEPENDENT_AMBULATORY_CARE_PROVIDER_SITE_OTHER): Payer: Medicare HMO | Admitting: Family Medicine

## 2021-03-12 ENCOUNTER — Encounter (INDEPENDENT_AMBULATORY_CARE_PROVIDER_SITE_OTHER): Payer: Self-pay

## 2021-03-12 VITALS — BP 104/72 | HR 95 | Temp 98.3°F | Resp 16 | Wt 166.0 lb

## 2021-03-12 DIAGNOSIS — Z789 Other specified health status: Secondary | ICD-10-CM | POA: Diagnosis not present

## 2021-03-12 DIAGNOSIS — J4540 Moderate persistent asthma, uncomplicated: Secondary | ICD-10-CM | POA: Diagnosis not present

## 2021-03-12 DIAGNOSIS — I10 Essential (primary) hypertension: Secondary | ICD-10-CM

## 2021-03-12 NOTE — Progress Notes (Signed)
Patient is here for f/up HTN, Patient said that when he is in cold weather his SOB becomes more noticeable

## 2021-03-12 NOTE — Progress Notes (Signed)
Established Patient Office Visit  Subjective:  Patient ID: Nathaniel Hartman, male    DOB: 1950-02-22  Age: 71 y.o. MRN: [id number]  CC:  Chief Complaint  Patient presents with   Follow-up    HPI Raistlin Gum presents for follow up of chronic med issues including hypertension and asthma. Patient reports that he utilizes his symbicort and albuterol as directed. This visit was aided by interpretor.   Past Medical History:  Diagnosis Date   Asthma    Bilateral pneumonia 07/31/2007   Chronic cough    Chronic kidney disease    COPD (chronic obstructive pulmonary disease) (HCC)    GERD (gastroesophageal reflux disease)    pt denies this (05/31/14)   Hypertension    Pansinusitis    Shortness of breath     Past Surgical History:  Procedure Laterality Date   EYE SURGERY Right 2008   cataract surgery   left hand surgery  08/2006   NASAL SINUS SURGERY Bilateral 06/02/2014   Procedure: BILATERAL ENDOSCOPIC SINUS SURGERY WITH FUSION ;  Surgeon: Osborn Coho, MD;  Location: [facility] OR;  Service: ENT;  Laterality: Bilateral;   POLYPECTOMY N/A 06/02/2014   Procedure: POLYPECTOMY NASAL;  Surgeon: Osborn Coho, MD;  Location: [facility] OR;  Service: ENT;  Laterality: N/A;    No family history on file.  Social History   Socioeconomic History   Marital status: Single    Spouse name: Not on file   Number of children: Not on file   Years of education: Not on file   Highest education level: Not on file  Occupational History   Occupation: metal work  Tobacco Use   Smoking status: Former    Packs/day: 0.50    Years: 4.00    Pack years: 2.00    Types: Cigarettes    Quit date: 02/11/1997    Years since quitting: 24.0   Smokeless tobacco: Never  Substance and Sexual Activity   Alcohol use: No   Drug use: No   Sexual activity: Not on file  Other Topics Concern   Not on file  Social History Narrative   Not on file   Social Determinants of Health   Financial Resource Strain: Not on file   Food Insecurity: Not on file  Transportation Needs: Not on file  Physical Activity: Not on file  Stress: Not on file  Social Connections: Not on file  Intimate Partner Violence: Not on file    ROS Review of Systems  Constitutional:  Negative for chills and fever.  Respiratory:  Positive for shortness of breath. Negative for cough and wheezing.   All other systems reviewed and are negative.  Objective:   Today's Vitals: BP 104/72    Pulse 95    Temp 98.3 F (36.8 C) (Oral)    Resp 16    Wt 166 lb (75.3 kg)    SpO2 90%    BMI 21.90 kg/m   Physical Exam Vitals and nursing note reviewed.  Constitutional:      General: He is not in acute distress. Cardiovascular:     Rate and Rhythm: Normal rate and regular rhythm.  Pulmonary:     Effort: Pulmonary effort is normal.     Breath sounds: Normal breath sounds.  Abdominal:     Palpations: Abdomen is soft.     Tenderness: There is no abdominal tenderness.  Musculoskeletal:     Right lower leg: No edema.     Left lower leg: No edema.  Neurological:  General: No focal deficit present.     Mental Status: He is alert and oriented to person, place, and time.    Assessment & Plan:   1. Essential hypertension Much improved with present management. Continue and monitor  2. Moderate persistent asthma without complication Referral to pulmonary for further eval/mgt  - Ambulatory referral to Pulmonology  3. Language barrier to communication     Outpatient Encounter Medications as of 03/12/2021  Medication Sig   albuterol (PROVENTIL HFA;VENTOLIN HFA) 108 (90 BASE) MCG/ACT inhaler Inhale 2 puffs into the lungs every 6 (six) hours as needed for wheezing.   albuterol (PROVENTIL) (2.5 MG/3ML) 0.083% nebulizer solution Take 3 mLs (2.5 mg total) by nebulization every 4 (four) hours as needed for wheezing or shortness of breath.   amoxicillin-clavulanate (AUGMENTIN) 500-125 MG tablet Take 1 tablet (500 mg total) by mouth 2 (two) times  daily.   budesonide-formoterol (SYMBICORT) 160-4.5 MCG/ACT inhaler Inhale 2 puffs into the lungs 2 (two) times daily.   cetirizine (ZYRTEC) 10 MG tablet Take 1 tablet (10 mg total) by mouth daily.   Famotidine (PEPCID PO) Take 1 tablet by mouth at bedtime.   fluticasone (FLONASE) 50 MCG/ACT nasal spray Place into both nostrils daily.   HYDROcodone-acetaminophen (NORCO) 5-325 MG per tablet Take 1-2 tablets by mouth every 6 (six) hours as needed.   HYDROcodone-homatropine (HYCODAN) 5-1.5 MG/5ML syrup Take 5 mLs by mouth every 4 (four) hours as needed.   lisinopril (ZESTRIL) 20 MG tablet TAKE 1 TABLET BY MOUTH EVERY DAY   loratadine (CLARITIN) 10 MG tablet Take 1 tablet (10 mg total) by mouth daily.   triamcinolone cream (KENALOG) 0.1 % Apply 1 application topically 2 (two) times daily.   [DISCONTINUED] omeprazole (PRILOSEC OTC) 20 MG tablet Take 20 mg by mouth daily.     No facility-administered encounter medications on file as of 03/12/2021.    Follow-up: No follow-ups on file.   [name], MD

## 2021-03-14 ENCOUNTER — Encounter: Payer: Self-pay | Admitting: Family Medicine

## 2021-03-24 ENCOUNTER — Emergency Department (HOSPITAL_COMMUNITY): Payer: Medicare HMO

## 2021-03-24 ENCOUNTER — Other Ambulatory Visit: Payer: Self-pay

## 2021-03-24 ENCOUNTER — Encounter (HOSPITAL_COMMUNITY): Payer: Self-pay | Admitting: Emergency Medicine

## 2021-03-24 ENCOUNTER — Emergency Department (HOSPITAL_COMMUNITY)
Admission: EM | Admit: 2021-03-24 | Discharge: 2021-03-24 | Disposition: A | Discharge status: Home / Self Care | Payer: Medicare HMO | Attending: Emergency Medicine | Admitting: Emergency Medicine

## 2021-03-24 DIAGNOSIS — R509 Fever, unspecified: Secondary | ICD-10-CM | POA: Diagnosis not present

## 2021-03-24 DIAGNOSIS — J189 Pneumonia, unspecified organism: Secondary | ICD-10-CM

## 2021-03-24 DIAGNOSIS — Z20822 Contact with and (suspected) exposure to covid-19: Secondary | ICD-10-CM | POA: Insufficient documentation

## 2021-03-24 DIAGNOSIS — R059 Cough, unspecified: Secondary | ICD-10-CM | POA: Diagnosis not present

## 2021-03-24 DIAGNOSIS — B370 Candidal stomatitis: Secondary | ICD-10-CM | POA: Diagnosis not present

## 2021-03-24 DIAGNOSIS — J439 Emphysema, unspecified: Secondary | ICD-10-CM | POA: Diagnosis not present

## 2021-03-24 DIAGNOSIS — I10 Essential (primary) hypertension: Secondary | ICD-10-CM | POA: Diagnosis not present

## 2021-03-24 DIAGNOSIS — Z79899 Other long term (current) drug therapy: Secondary | ICD-10-CM | POA: Diagnosis not present

## 2021-03-24 DIAGNOSIS — R0602 Shortness of breath: Secondary | ICD-10-CM | POA: Diagnosis present

## 2021-03-24 LAB — CBC WITH DIFFERENTIAL/PLATELET
Abs Immature Granulocytes: 0.04 10*3/uL (ref 0.00–0.07)
Basophils Absolute: 0 10*3/uL (ref 0.0–0.1)
Basophils Relative: 0 %
Eosinophils Absolute: 0 10*3/uL (ref 0.0–0.5)
Eosinophils Relative: 0 %
HCT: 36.7 % — ABNORMAL LOW (ref 39.0–52.0)
Hemoglobin: 12.1 g/dL — ABNORMAL LOW (ref 13.0–17.0)
Immature Granulocytes: 0 %
Lymphocytes Relative: 9 %
Lymphs Abs: 1 10*3/uL (ref 0.7–4.0)
MCH: 27.1 pg (ref 26.0–34.0)
MCHC: 33 g/dL (ref 30.0–36.0)
MCV: 82.3 fL (ref 80.0–100.0)
Monocytes Absolute: 1 10*3/uL (ref 0.1–1.0)
Monocytes Relative: 9 %
Neutro Abs: 8.7 10*3/uL — ABNORMAL HIGH (ref 1.7–7.7)
Neutrophils Relative %: 82 %
Platelets: 176 10*3/uL (ref 150–400)
RBC: 4.46 MIL/uL (ref 4.22–5.81)
RDW: 13.7 % (ref 11.5–15.5)
WBC: 10.7 10*3/uL — ABNORMAL HIGH (ref 4.0–10.5)
nRBC: 0 % (ref 0.0–0.2)

## 2021-03-24 LAB — URINALYSIS, ROUTINE W REFLEX MICROSCOPIC
Bacteria, UA: NONE SEEN
Bilirubin Urine: NEGATIVE
Glucose, UA: NEGATIVE mg/dL
Ketones, ur: NEGATIVE mg/dL
Leukocytes,Ua: NEGATIVE
Nitrite: NEGATIVE
Protein, ur: 30 mg/dL — AB
Specific Gravity, Urine: 1.015 (ref 1.005–1.030)
pH: 6 (ref 5.0–8.0)

## 2021-03-24 LAB — HEPATIC FUNCTION PANEL
ALT: 19 U/L (ref 0–44)
AST: 24 U/L (ref 15–41)
Albumin: 2.8 g/dL — ABNORMAL LOW (ref 3.5–5.0)
Alkaline Phosphatase: 70 U/L (ref 38–126)
Bilirubin, Direct: 0.3 mg/dL — ABNORMAL HIGH (ref 0.0–0.2)
Indirect Bilirubin: 0.8 mg/dL (ref 0.3–0.9)
Total Bilirubin: 1.1 mg/dL (ref 0.3–1.2)
Total Protein: 7.3 g/dL (ref 6.5–8.1)

## 2021-03-24 LAB — BASIC METABOLIC PANEL
Anion gap: 8 (ref 5–15)
BUN: 25 mg/dL — ABNORMAL HIGH (ref 8–23)
CO2: 20 mmol/L — ABNORMAL LOW (ref 22–32)
Calcium: 8.7 mg/dL — ABNORMAL LOW (ref 8.9–10.3)
Chloride: 101 mmol/L (ref 98–111)
Creatinine, Ser: 1.76 mg/dL — ABNORMAL HIGH (ref 0.61–1.24)
GFR, Estimated: 41 mL/min — ABNORMAL LOW (ref >60)
Glucose, Bld: 161 mg/dL — ABNORMAL HIGH (ref 70–99)
Potassium: 4.2 mmol/L (ref 3.5–5.1)
Sodium: 129 mmol/L — ABNORMAL LOW (ref 135–145)

## 2021-03-24 LAB — RESP PANEL BY RT-PCR (FLU A&B, COVID) ARPGX2
Influenza A by PCR: NEGATIVE
Influenza B by PCR: NEGATIVE
SARS Coronavirus 2 by RT PCR: NEGATIVE

## 2021-03-24 MED ORDER — SODIUM CHLORIDE 0.9 % IV BOLUS
1000.0000 mL | Freq: Once | INTRAVENOUS | Status: AC
Start: 2021-03-24 — End: 2021-03-24
  Administered 2021-03-24: 1000 mL via INTRAVENOUS

## 2021-03-24 MED ORDER — AMOXICILLIN-POT CLAVULANATE 875-125 MG PO TABS
1.0000 | ORAL_TABLET | Freq: Two times a day (BID) | ORAL | 0 refills | Status: DC
  Filled 2021-03-24: qty 14, 7d supply, fill #0

## 2021-03-24 MED ORDER — AZITHROMYCIN 250 MG PO TABS
500.0000 mg | ORAL_TABLET | Freq: Every day | ORAL | Status: DC
  Administered 2021-03-24: 500 mg via ORAL
  Filled 2021-03-24: qty 2

## 2021-03-24 MED ORDER — NYSTATIN 100000 UNIT/ML MT SUSP
5.0000 mL | Freq: Once | OROMUCOSAL | Status: AC
  Administered 2021-03-24: 500000 [IU] via ORAL
  Filled 2021-03-24: qty 5

## 2021-03-24 MED ORDER — NYSTATIN 100000 UNIT/ML MT SUSP
500000.0000 [IU] | Freq: Four times a day (QID) | OROMUCOSAL | 0 refills | Status: DC
  Filled 2021-03-24: qty 60, 3d supply, fill #0

## 2021-03-24 MED ORDER — AZITHROMYCIN 250 MG PO TABS
250.0000 mg | ORAL_TABLET | Freq: Every day | ORAL | 0 refills | Status: DC
  Filled 2021-03-24: qty 6, 5d supply, fill #0

## 2021-03-24 MED ORDER — SODIUM CHLORIDE 0.9 % IV SOLN
1.0000 g | Freq: Once | INTRAVENOUS | Status: AC
  Administered 2021-03-24: 1 g via INTRAVENOUS
  Filled 2021-03-24: qty 10

## 2021-03-24 MED ORDER — IPRATROPIUM-ALBUTEROL 0.5-2.5 (3) MG/3ML IN SOLN
3.0000 mL | Freq: Once | RESPIRATORY_TRACT | Status: AC
  Administered 2021-03-24: 3 mL via RESPIRATORY_TRACT
  Filled 2021-03-24: qty 3

## 2021-03-24 NOTE — ED Provider Notes (Signed)
[facility]ENC[facility] Provider Note   CSN: [id number] Arrival date & time: 03/24/21  1334     History  No chief complaint on file.   Nathaniel Hartman is a 71 y.o. male.  HPI  This patient is a 71 year old male, he has a history of hypertension for which she is on lisinopril, he has some allergens such as cetirizine and has a history of some respiratory issues which are currently undefined for which [facility]  He has had decreased oral intake the last few days -and has not been wanting to eat anything.  His wife who also presents reports that he has had some occasional shortness of breath, they have seen the family doctor and been referred to pulmonology but have not yet seen them.  He is currently taking Symbicort and albuterol.  He denies chest pain fevers vomiting diarrhea sore throat or runny nose.  He thinks he is here because he is not sleeping very well the last couple of days.  His wife states he is here because he is having some trouble breathing  Home Medications Prior to Admission medications   Medication Sig Start Date End Date Taking? Authorizing Provider  amoxicillin-clavulanate (AUGMENTIN) 875-125 MG tablet Take 1 tablet by mouth every 12 (twelve) hours. 03/24/21  Yes Eber HongMiller, Jaeshawn Silvio, MD  azithromycin (ZITHROMAX Z-PAK) 250 MG tablet Take 1 tablet (250 mg total) by mouth daily. 500mg  PO day 1, then 250mg  PO days 205 03/24/21  Yes Eber HongMiller, Arael Piccione, MD  nystatin (MYCOSTATIN) 100000 UNIT/ML suspension Take 5 mLs (500,000 Units total) by mouth 4 (four) times daily. 03/24/21  Yes Eber HongMiller, Kemarion Abbey, MD  albuterol (PROVENTIL HFA;VENTOLIN HFA) 108 (90 BASE) MCG/ACT inhaler Inhale 2 puffs into the lungs every 6 (six) hours as needed for wheezing. 08/18/13   Doris CheadleAdvani, Deepak, MD  albuterol (PROVENTIL) (2.5 MG/3ML) 0.083% nebulizer solution Take 3 mLs (2.5 mg total) by nebulization every 4 (four) hours as needed for wheezing or shortness  of breath. 05/06/14   Osvaldo ShipperKrishnan, Gokul, MD  budesonide-formoterol [facility](SYMBICORT) 160-4.5 MCG/ACT inhaler Inhale 2 puffs into the lungs 2 (two) times daily. 11/06/20   Georganna SkeansWilson, Amelia, MD  cetirizine (ZYRTEC) 10 MG tablet Take 1 tablet (10 mg total) by mouth daily. 11/06/20   Georganna SkeansWilson, Amelia, MD  Famotidine (PEPCID PO) Take 1 tablet by mouth at bedtime.    [provider]  fluticasone (FLONASE) 50 MCG/ACT nasal spray Place into both nostrils daily.    [provider]  lisinopril (ZESTRIL) 20 MG tablet TAKE 1 TABLET BY MOUTH EVERY DAY 02/26/21   Georganna SkeansWilson, Amelia, MD  triamcinolone cream (KENALOG) 0.1 % Apply 1 application topically 2 (two) times daily. 11/06/20   Georganna SkeansWilson, Amelia, MD  omeprazole (PRILOSEC OTC) 20 MG tablet Take 20 mg by mouth daily.    04/27/11  [provider]      Allergies    Benadryl [diphenhydramine hcl (sleep)], Motrin [ibuprofen], and Nsaids    Review of Systems   Review of Systems  All other systems reviewed and are negative.  Physical Exam Updated Vital Signs BP 105/82    Pulse 80    Temp 98.8 F (37.1 C) (Oral)    Resp 16    SpO2 99%  Physical Exam Vitals and nursing note reviewed.  Constitutional:      General: He is not in acute distress.    Appearance: He is well-developed.  HENT:     Head: Normocephalic and atraumatic.  Mouth/Throat:     Pharynx: No oropharyngeal exudate.     Comments: Thrush present on tongue and palate into the oropharynx Eyes:     General: No scleral icterus.       Right eye: No discharge.        Left eye: No discharge.     Conjunctiva/sclera: Conjunctivae normal.     Pupils: Pupils are equal, round, and reactive to light.  Neck:     Thyroid: No thyromegaly.     Vascular: No JVD.     Comments: No JVD, very supple neck Cardiovascular:     Rate and Rhythm: Regular rhythm. Tachycardia present.     Heart sounds: Normal heart sounds. No murmur heard.   No friction rub. No gallop.     Comments: Heart rate of 105 on  my exam Pulmonary:     Effort: Pulmonary effort is normal. No respiratory distress.     Breath sounds: Wheezing present. No rales.     Comments: No tachypnea, speaks in full sentences, no accessory muscle use, no increased work of breathing, diffuse expiratory wheezes with a prolonged expiratory phase Abdominal:     General: Bowel sounds are normal. There is no distension.     Palpations: Abdomen is soft. There is no mass.     Tenderness: There is no abdominal tenderness.  Musculoskeletal:        General: No tenderness. Normal range of motion.     Cervical back: Normal range of motion and neck supple.     Right lower leg: No edema.     Left lower leg: No edema.  Lymphadenopathy:     Cervical: No cervical adenopathy.  Skin:    General: Skin is warm and dry.     Findings: No erythema or rash.  Neurological:     General: No focal deficit present.     Mental Status: He is alert.     Coordination: Coordination normal.  Psychiatric:        Behavior: Behavior normal.    ED Results / Procedures / Treatments   Labs (all labs ordered are listed, but only abnormal results are displayed) Labs Reviewed  CBC WITH DIFFERENTIAL/PLATELET - Abnormal; Notable for the following components:      Result Value   WBC 10.7 (*)    Hemoglobin 12.1 (*)    HCT 36.7 (*)    Neutro Abs 8.7 (*)    All other components within normal limits  BASIC METABOLIC PANEL - Abnormal; Notable for the following components:   Sodium 129 (*)    CO2 20 (*)    Glucose, Bld 161 (*)    BUN 25 (*)    Creatinine, Ser 1.76 (*)    Calcium 8.7 (*)    GFR, Estimated 41 (*)    All other components within normal limits  URINALYSIS, ROUTINE W REFLEX MICROSCOPIC - Abnormal; Notable for the following components:   Hgb urine dipstick SMALL (*)    Protein, ur 30 (*)    All other components within normal limits  HEPATIC FUNCTION PANEL - Abnormal; Notable for the following components:   Albumin 2.8 (*)    Bilirubin, Direct 0.3  (*)    All other components within normal limits  RESP PANEL BY RT-PCR (FLU A&B, COVID) ARPGX2    EKG None  Radiology DG Chest 1 View  Result Date: 03/24/2021 CLINICAL DATA:  Cough and fever. EXAM: CHEST  1 VIEW COMPARISON:  Chest radiographs 05/03/2014 and CT 08/09/2007 FINDINGS: The  cardiomediastinal silhouette is unchanged with normal heart size. There is persistent or recurrent opacity in the right infrahilar region. The left lung is clear. There is a background of lung hyperinflation with underlying emphysema. No sizable pleural effusion or pneumothorax is identified. No acute osseous abnormality is seen. IMPRESSION: Right infrahilar opacity which may reflect pneumonia. Followup PA and lateral chest radiographs are recommended in 3-4 weeks following trial of antibiotic therapy to ensure resolution and exclude underlying malignancy. Electronically Signed   By: Sebastian Ache M.D.   On: 03/24/2021 15:09    Procedures Procedures    Medications Ordered in ED Medications  azithromycin (ZITHROMAX) tablet 500 mg (500 mg Oral Given 03/24/21 1801)  sodium chloride 0.9 % bolus 1,000 mL (1,000 mLs Intravenous New Bag/Given 03/24/21 1540)  ipratropium-albuterol (DUONEB) 0.5-2.5 (3) MG/3ML nebulizer solution 3 mL (3 mLs Nebulization Given 03/24/21 1813)  nystatin (MYCOSTATIN) 100000 UNIT/ML suspension 500,000 Units (500,000 Units Oral Given 03/24/21 1802)  cefTRIAXone (ROCEPHIN) 1 g in sodium chloride 0.9 % 100 mL IVPB (1 g Intravenous New Bag/Given 03/24/21 1804)    ED Course/ Medical Decision Making/ A&P                           Medical Decision Making Amount and/or Complexity of Data Reviewed Labs: ordered.  Risk Prescription drug management.   This patient presents to the ED for concern of shortness of breath and lack of sleep with decreased appetite, this involves an extensive number of treatment options, and is a complaint that carries with it a high risk of complications and morbidity.   The differential diagnosis includes infection, dehydration, clearly he has thrush suggestive that it is the Symbicort, would wonder about underlying immunocompromise state.  Rule out pneumonia, COVID, flu, urinary infection, kidney failure  Co morbidities that complicate the patient evaluation  Hypertension, advanced age, lung disease of unknown etiology   Additional history obtained:  Additional history obtained from spouse and medical record External records from outside source obtained and reviewed including prior medical records showing that the patient has had prior endoscopic sinus surgery back in 2016, currently followed mostly for his hypertension   Lab Tests:  I Ordered, and personally interpreted labs.  The pertinent results include:  the patient has a normal white blood cell count of 10,700, hemoglobin is normal at 12.1.  His electrolytes are slightly off with a mild hyponatremia of 129.  His creatinine is elevated at 1.76, last checked at 1.3 from September 2022   Imaging Studies ordered:  I ordered imaging studies including chest x-ray I independently visualized and interpreted imaging which showed normal pneumothorax, no pneumonia I agree with the radiologist interpretation   Cardiac Monitoring:  The patient was maintained on a cardiac monitor.  I personally viewed and interpreted the cardiac monitored which showed an underlying rhythm of: Mild sinus tachycardia about 105 beats on my exam   Medicines ordered and prescription drug management:  I ordered medication including nystatin liquid, albuterol treatments for thrush and reactive airway disease, also given IV fluids for some renal insufficiency Reevaluation of the patient after these medicines showed that the patient improved  I have reviewed the patients home medicines and have made adjustments as needed   Test Considered:  Plan of the chest however the x-ray of the chest shows pneumonia which is reasonable  to treat at this point   Critical Interventions:  Antibiotics and nystatin for the thrush     Problem  List / ED Course:  Pneumonia and thrush, both treated with antibiotics and antifungals, patient very well-appearing for discharge with normal vital signs without tachycardia or hypoxia   Reevaluation:  After the interventions noted above, I reevaluated the patient and found that they have : Improved   Social Determinants of Health:  Language barrier   Dispostion:  After consideration of the diagnostic results and the patients response to treatment, I feel that the patent would benefit from discharge home, patient agreeable, they understand the indications for return.          Final Clinical Impression(s) / ED Diagnoses Final diagnoses:  Community acquired pneumonia of right lung, unspecified part of lung  Thrush    Rx / DC Orders ED Discharge Orders          Ordered    amoxicillin-clavulanate (AUGMENTIN) 875-125 MG tablet  Every 12 hours        03/24/21 1917    azithromycin (ZITHROMAX Z-PAK) 250 MG tablet  Daily        03/24/21 1917    nystatin (MYCOSTATIN) 100000 UNIT/ML suspension  4 times daily        03/24/21 1917              Eber Hong, MD 03/24/21 1920

## 2021-03-24 NOTE — ED Notes (Signed)
PT will be brought to room my CT

## 2021-03-24 NOTE — ED Triage Notes (Signed)
Pt smiling and laughing.  States he is here because he couldn't sleep 2 nights ago and was dreaming.  Denies pain.  Once family arrived to triage reports pt had cold symptoms- cough and fever on Monday that are now better.  Family states he has not wanted to eat for 2 days and they are trying to get him to drink ensure.  States he is drinking a lot of water.

## 2021-03-24 NOTE — ED Provider Triage Note (Signed)
Emergency Medicine Provider Triage Evaluation Note  Nathaniel Hartman , a 71 y.o. male  was evaluated in triage.  Pt complains of cough, decreased po intake, and recent fall. Wife at bedside translated in triage. Notes patient was feeling unwell a few days ago, but started to feel better; however he has not eaten in 2 days. No abdominal pain, nausea, vomiting, or diarrhea. Wife notes patient fell the other day. No head injury per wife.   Review of Systems  Positive: cough Negative: CP  Physical Exam  BP 103/75 (BP Location: Left Arm)    Pulse (!) 119    Temp 98.8 F (37.1 C) (Oral)    Resp 14    SpO2 90%  Gen:   Awake, no distress   Resp:  Normal effort  MSK:   Moves extremities without difficulty  Other:    Medical Decision Making  Medically screening exam initiated at 1:59 PM.  Appropriate orders placed.  Nathaniel Hartman was informed that the remainder of the evaluation will be completed by another provider, this initial triage assessment does not replace that evaluation, and the importance of remaining in the ED until their evaluation is complete.  Routine labs CXR EKG COVID test   Suzy Bouchard, PA-C 03/24/21 1400

## 2021-03-24 NOTE — ED Notes (Signed)
Discharge instructions reviewed with patient and significant other. Patient and significant other verbalized understanding of instructions. Follow-up care and medications were reviewed. Patient wheeled out of ED in wheelchair. VSS upon discharge.

## 2021-03-24 NOTE — Discharge Instructions (Addendum)
Your testing shows that you do have a small pneumonia.  You need to take the following medications for this infection: Augmentin twice daily for 7 days, Zithromax daily for 5 days  I would also like for you to use the liquid nystatin, please put this in your mouth swish it around and swallow 4 times daily for the next 5 days  You will need to have your family doctor recheck you within 2 or 3 days  If you develop severe or worsening symptoms return to the emergency department immediately

## 2021-03-26 ENCOUNTER — Other Ambulatory Visit: Payer: Self-pay

## 2021-03-27 ENCOUNTER — Encounter: Payer: Self-pay | Admitting: Family Medicine

## 2021-03-27 ENCOUNTER — Other Ambulatory Visit: Payer: Self-pay

## 2021-03-27 ENCOUNTER — Ambulatory Visit (INDEPENDENT_AMBULATORY_CARE_PROVIDER_SITE_OTHER): Payer: Medicare HMO | Admitting: Family Medicine

## 2021-03-27 VITALS — BP 102/71 | HR 91 | Temp 98.1°F | Resp 16 | Wt 165.6 lb

## 2021-03-27 DIAGNOSIS — J189 Pneumonia, unspecified organism: Secondary | ICD-10-CM | POA: Diagnosis not present

## 2021-03-27 NOTE — Progress Notes (Signed)
Patient is still coughing a lot at night. Patient said since leaving the ER he is a little better. Patient wife is just concerned about the coughing  Patient has pain with deep breathing still to this day per wife.

## 2021-03-27 NOTE — Progress Notes (Signed)
Established Patient Office Visit  Subjective:  Patient ID: Nathaniel Hartman, male    DOB: Sep 18, 1950  Age: 71 y.o. MRN: [id number]  CC:  Chief Complaint  Patient presents with   Follow-up    ER visit    HPI Nathaniel Hartman presents for follow up of ED visit where he was dx with pneumonia. Although the visit was 3 days ago, he just began the abx on yesterday. Denies fever/chills but does report cough and SOB. This visit was aided by an interpretor.   Past Medical History:  Diagnosis Date   Asthma    Bilateral pneumonia 07/31/2007   Chronic cough    Chronic kidney disease    COPD (chronic obstructive pulmonary disease) (HCC)    GERD (gastroesophageal reflux disease)    pt denies this (05/31/14)   Hypertension    Pansinusitis    Shortness of breath     Past Surgical History:  Procedure Laterality Date   EYE SURGERY Right 2008   cataract surgery   left hand surgery  08/2006   NASAL SINUS SURGERY Bilateral 06/02/2014   Procedure: BILATERAL ENDOSCOPIC SINUS SURGERY WITH FUSION ;  Surgeon: Jerrell Belfast, MD;  Location: [place];  Service: ENT;  Laterality: Bilateral;   POLYPECTOMY N/A 06/02/2014   Procedure: POLYPECTOMY NASAL;  Surgeon: Jerrell Belfast, MD;  Location: [place];  Service: ENT;  Laterality: N/A;    No family history on file.  Social History   Socioeconomic History   Marital status: Single    Spouse name: Not on file   Number of children: Not on file   Years of education: Not on file   Highest education level: Not on file  Occupational History   Occupation: metal work  Tobacco Use   Smoking status: Former    Packs/day: 0.50    Years: 4.00    Pack years: 2.00    Types: Cigarettes    Quit date: 02/11/1997    Years since quitting: 24.1   Smokeless tobacco: Never  Substance and Sexual Activity   Alcohol use: No   Drug use: No   Sexual activity: Not on file  Other Topics Concern   Not on file  Social History Narrative   Not on file   Social Determinants of  Health   Financial Resource Strain: Not on file  Food Insecurity: Not on file  Transportation Needs: Not on file  Physical Activity: Not on file  Stress: Not on file  Social Connections: Not on file  Intimate Partner Violence: Not on file    ROS Review of Systems  Constitutional:  Negative for chills and fever.  Respiratory:  Positive for cough and shortness of breath.   All other systems reviewed and are negative.  Objective:   Today's Vitals: BP 102/71    Pulse 91    Temp 98.1 F (36.7 C) (Oral)    Resp 16    Wt 165 lb 9.6 oz (75.1 kg)    SpO2 (!) 88%    BMI 21.85 kg/m   Physical Exam Vitals and nursing note reviewed.  Constitutional:      General: He is not in acute distress. Cardiovascular:     Rate and Rhythm: Normal rate and regular rhythm.  Pulmonary:     Effort: Pulmonary effort is normal.     Breath sounds: Rales present. No wheezing.  Neurological:     General: No focal deficit present.     Mental Status: He is alert and oriented to person, place,  and time.    Assessment & Plan:    1. Community acquired pneumonia, unspecified laterality Appears to be improving per patient although patient has only been taking the abx for one day. Discussed that if sx persist and worsen instead of improve to return to the ED. Voiced understanding.    Outpatient Encounter Medications as of 03/27/2021  Medication Sig   albuterol (PROVENTIL HFA;VENTOLIN HFA) 108 (90 BASE) MCG/ACT inhaler Inhale 2 puffs into the lungs every 6 (six) hours as needed for wheezing.   albuterol (PROVENTIL) (2.5 MG/3ML) 0.083% nebulizer solution Take 3 mLs (2.5 mg total) by nebulization every 4 (four) hours as needed for wheezing or shortness of breath.   amoxicillin-clavulanate (AUGMENTIN) 875-125 MG tablet Take 1 tablet by mouth every 12 (twelve) hours.   azithromycin (ZITHROMAX Z-PAK) 250 MG tablet Take 2 tablets by mouth on day 1, then 1 tablet by mouth on days 2-5   budesonide-formoterol  (SYMBICORT) 160-4.5 MCG/ACT inhaler Inhale 2 puffs into the lungs 2 (two) times daily.   cetirizine (ZYRTEC) 10 MG tablet Take 1 tablet (10 mg total) by mouth daily.   Famotidine (PEPCID PO) Take 1 tablet by mouth at bedtime.   fluticasone (FLONASE) 50 MCG/ACT nasal spray Place into both nostrils daily.   lisinopril (ZESTRIL) 20 MG tablet TAKE 1 TABLET BY MOUTH EVERY DAY   nystatin (MYCOSTATIN) 100000 UNIT/ML suspension Take 5 mLs (500,000 Units total) by mouth 4 (four) times daily.   triamcinolone cream (KENALOG) 0.1 % Apply 1 application topically 2 (two) times daily.   [DISCONTINUED] omeprazole (PRILOSEC OTC) 20 MG tablet Take 20 mg by mouth daily.     No facility-administered encounter medications on file as of 03/27/2021.    Follow-up: No follow-ups on file.   Becky Sax, MD

## 2021-05-30 ENCOUNTER — Other Ambulatory Visit: Payer: Self-pay | Admitting: Family Medicine

## 2021-05-30 DIAGNOSIS — L309 Dermatitis, unspecified: Secondary | ICD-10-CM

## 2021-05-30 NOTE — Telephone Encounter (Signed)
Requested medication (s) are due for refill today: yes ? ?Requested medication (s) are on the active medication list: yes ? ?Last refill:  11/06/20 #80 g 1 refill ? ?Future visit scheduled: yes in 5 days ? ?Notes to clinic:  not delegated per protocol.  ? ? ?  ?Requested Prescriptions  ?Pending Prescriptions Disp Refills  ? triamcinolone cream (KENALOG) 0.1 % [Pharmacy Med Name: TRIAMCINOLONE 0.1% CREAM] 60 g 1  ?  Sig: APPLY TO AFFECTED AREA TWICE A DAY  ?  ? Not Delegated - Dermatology:  Corticosteroids Failed - 05/30/2021  8:32 AM  ?  ?  Failed - This refill cannot be delegated  ?  ?  Passed - Valid encounter within last 12 months  ?  Recent Outpatient Visits   ? ?      ? 2 months ago Community acquired pneumonia, unspecified laterality  ? Primary Care at [facility], MD  ? 2 months ago Essential hypertension  ? Primary Care at [facility], MD  ? 5 months ago Essential hypertension  ? Primary Care at [facility], MD  ? 6 months ago Essential hypertension  ? Primary Care at [facility], Lauris Poag, MD  ? 6 years ago Acute upper respiratory infection  ? Primary Care at [facility], Sandria Bales, MD  ? ?  ?  ?Future Appointments   ? ?        ? In 5 days Georganna Skeans, MD Primary Care at [facility]  ? ?  ? ? ?  ?  ?  ? ?

## 2021-06-04 ENCOUNTER — Other Ambulatory Visit: Payer: Self-pay

## 2021-06-04 ENCOUNTER — Ambulatory Visit (INDEPENDENT_AMBULATORY_CARE_PROVIDER_SITE_OTHER): Payer: Medicare PPO | Admitting: Family Medicine

## 2021-06-04 ENCOUNTER — Encounter: Payer: Self-pay | Admitting: Family Medicine

## 2021-06-04 VITALS — BP 135/91 | HR 80 | Temp 97.6°F | Resp 12 | Wt 164.0 lb

## 2021-06-04 DIAGNOSIS — K219 Gastro-esophageal reflux disease without esophagitis: Secondary | ICD-10-CM

## 2021-06-04 DIAGNOSIS — J4540 Moderate persistent asthma, uncomplicated: Secondary | ICD-10-CM | POA: Diagnosis not present

## 2021-06-04 DIAGNOSIS — Z789 Other specified health status: Secondary | ICD-10-CM | POA: Diagnosis not present

## 2021-06-04 DIAGNOSIS — J449 Chronic obstructive pulmonary disease, unspecified: Secondary | ICD-10-CM

## 2021-06-04 DIAGNOSIS — L309 Dermatitis, unspecified: Secondary | ICD-10-CM | POA: Diagnosis not present

## 2021-06-04 MED ORDER — ALBUTEROL SULFATE HFA 108 (90 BASE) MCG/ACT IN AERS
2.0000 | INHALATION_SPRAY | Freq: Four times a day (QID) | RESPIRATORY_TRACT | 11 refills | Status: DC | PRN
  Filled 2021-06-04: qty 1, fill #0

## 2021-06-04 MED ORDER — ALBUTEROL SULFATE (2.5 MG/3ML) 0.083% IN NEBU: 2.5000 mg | INHALATION_SOLUTION | RESPIRATORY_TRACT | 3 refills | Status: DC | PRN

## 2021-06-04 MED ORDER — TRIAMCINOLONE ACETONIDE 0.1 % EX CREA: TOPICAL_CREAM | CUTANEOUS | 1 refills | Status: AC

## 2021-06-04 MED ORDER — MONTELUKAST SODIUM 10 MG PO TABS: 10.0000 mg | ORAL_TABLET | Freq: Every day | ORAL | 0 refills | Status: AC

## 2021-06-04 MED ORDER — TRIAMCINOLONE ACETONIDE 0.1 % EX CREA
TOPICAL_CREAM | CUTANEOUS | 1 refills | Status: DC
  Filled 2021-06-04: qty 60, fill #0

## 2021-06-04 MED ORDER — BUDESONIDE-FORMOTEROL FUMARATE 160-4.5 MCG/ACT IN AERO: 2.0000 | INHALATION_SPRAY | Freq: Two times a day (BID) | RESPIRATORY_TRACT | 12 refills | Status: DC

## 2021-06-04 MED ORDER — BUDESONIDE-FORMOTEROL FUMARATE 160-4.5 MCG/ACT IN AERO
2.0000 | INHALATION_SPRAY | Freq: Two times a day (BID) | RESPIRATORY_TRACT | 12 refills | Status: DC
  Filled 2021-06-04: qty 1, fill #0

## 2021-06-04 MED ORDER — OMEPRAZOLE MAGNESIUM 20 MG PO TBEC: 20.0000 mg | DELAYED_RELEASE_TABLET | Freq: Every day | ORAL | 0 refills | Status: DC

## 2021-06-04 MED ORDER — MONTELUKAST SODIUM 10 MG PO TABS
10.0000 mg | ORAL_TABLET | Freq: Every day | ORAL | 0 refills | Status: DC
  Filled 2021-06-04: qty 90, 90d supply, fill #0

## 2021-06-04 MED ORDER — LISINOPRIL 30 MG PO TABS
30.0000 mg | ORAL_TABLET | Freq: Every day | ORAL | 0 refills | Status: DC
  Filled 2021-06-04: qty 90, 90d supply, fill #0

## 2021-06-04 MED ORDER — LISINOPRIL 30 MG PO TABS: 30.0000 mg | ORAL_TABLET | Freq: Every day | ORAL | 0 refills | Status: DC

## 2021-06-04 MED ORDER — OMEPRAZOLE MAGNESIUM 20 MG PO TBEC
20.0000 mg | DELAYED_RELEASE_TABLET | Freq: Every day | ORAL | 0 refills | Status: DC
  Filled 2021-06-04: qty 90, 90d supply, fill #0

## 2021-06-04 NOTE — Progress Notes (Signed)
Feels better today with breathing ?Has cough and nasal congestion  ?

## 2021-06-05 ENCOUNTER — Other Ambulatory Visit: Payer: Self-pay

## 2021-06-06 NOTE — Progress Notes (Signed)
? ?Established Patient Office Visit ? ?Subjective   ? ?Patient ID: Nathaniel Hartman, male    DOB: 07/04/1950  Age: 71 y.o. MRN: [id number] ? ?CC:  ?Chief Complaint  ?Patient presents with  ? Cough  ? Nasal Congestion  ? ? ?HPI ?Nathaniel Hartman presents for follow up of chronic med issues. Patient reports that he needs med refills as well as having persistent allergy symptoms during this high pollen time of year. This visi was aided by an interpreter.  ? ? ?Outpatient Encounter Medications as of 06/04/2021  ?Medication Sig  ? cetirizine (ZYRTEC) 10 MG tablet Take 1 tablet (10 mg total) by mouth daily.  ? Famotidine (PEPCID PO) Take 1 tablet by mouth at bedtime.  ? fluticasone (FLONASE) 50 MCG/ACT nasal spray Place into both nostrils daily.  ? [DISCONTINUED] albuterol (PROVENTIL HFA;VENTOLIN HFA) 108 (90 BASE) MCG/ACT inhaler Inhale 2 puffs into the lungs every 6 (six) hours as needed for wheezing.  ? [DISCONTINUED] amoxicillin-clavulanate (AUGMENTIN) 875-125 MG tablet Take 1 tablet by mouth every 12 (twelve) hours.  ? [DISCONTINUED] budesonide-formoterol (SYMBICORT) 160-4.5 MCG/ACT inhaler Inhale 2 puffs into the lungs 2 (two) times daily.  ? [DISCONTINUED] lisinopril (ZESTRIL) 20 MG tablet TAKE 1 TABLET BY MOUTH EVERY DAY  ? [DISCONTINUED] lisinopril (ZESTRIL) 30 MG tablet Take 1 tablet (30 mg total) by mouth daily.  ? [DISCONTINUED] montelukast (SINGULAIR) 10 MG tablet Take 1 tablet (10 mg total) by mouth at bedtime.  ? [DISCONTINUED] triamcinolone cream (KENALOG) 0.1 % APPLY TO AFFECTED AREA TWICE A DAY  ? [DISCONTINUED] triamcinolone cream (KENALOG) 0.1 % APPLY TO AFFECTED AREA TWICE A DAY  ? budesonide-formoterol (SYMBICORT) 160-4.5 MCG/ACT inhaler Inhale 2 puffs into the lungs 2 (two) times daily.  ? lisinopril (ZESTRIL) 30 MG tablet Take 1 tablet (30 mg total) by mouth daily.  ? montelukast (SINGULAIR) 10 MG tablet Take 1 tablet (10 mg total) by mouth at bedtime.  ? omeprazole (PRILOSEC OTC) 20 MG tablet Take 1  tablet (20 mg total) by mouth daily.  ? triamcinolone cream (KENALOG) 0.1 % APPLY TO AFFECTED AREA TWICE A DAY  ? [DISCONTINUED] albuterol (PROVENTIL) (2.5 MG/3ML) 0.083% nebulizer solution Take 3 mLs (2.5 mg total) by nebulization every 4 (four) hours as needed for wheezing or shortness of breath. (Patient not taking: Reported on 06/04/2021)  ? [DISCONTINUED] albuterol (PROVENTIL) (2.5 MG/3ML) 0.083% nebulizer solution Take 3 mLs (2.5 mg total) by nebulization every 4 (four) hours as needed for wheezing or shortness of breath.  ? [DISCONTINUED] albuterol (VENTOLIN HFA) 108 (90 Base) MCG/ACT inhaler Inhale 2 puffs into the lungs every 6 (six) hours as needed for wheezing.  ? [DISCONTINUED] azithromycin (ZITHROMAX Z-PAK) 250 MG tablet Take 2 tablets by mouth on day 1, then 1 tablet by mouth on days 2-5  ? [DISCONTINUED] budesonide-formoterol (SYMBICORT) 160-4.5 MCG/ACT inhaler Inhale 2 puffs into the lungs 2 (two) times daily.  ? [DISCONTINUED] nystatin (MYCOSTATIN) 100000 UNIT/ML suspension Take 5 mLs (500,000 Units total) by mouth 4 (four) times daily.  ? [DISCONTINUED] omeprazole (PRILOSEC OTC) 20 MG tablet Take 20 mg by mouth daily.    ? [DISCONTINUED] omeprazole (PRILOSEC OTC) 20 MG tablet Take 1 tablet (20 mg total) by mouth daily.  ? ?No facility-administered encounter medications on file as of 06/04/2021.  ? ? ?Past Medical History:  ?Diagnosis Date  ? Asthma   ? Bilateral pneumonia 07/31/2007  ? Chronic cough   ? Chronic kidney disease   ? COPD (chronic obstructive pulmonary disease) (HCC)   ?  GERD (gastroesophageal reflux disease)   ? pt denies this (05/31/14)  ? Hypertension   ? Pansinusitis   ? Shortness of breath   ? ? ?Past Surgical History:  ?Procedure Laterality Date  ? EYE SURGERY Right 2008  ? cataract surgery  ? left hand surgery  08/2006  ? NASAL SINUS SURGERY Bilateral 06/02/2014  ? Procedure: BILATERAL ENDOSCOPIC SINUS SURGERY WITH FUSION ;  Surgeon: Osborn Coho, MD;  Location: [facility] OR;  Service:  ENT;  Laterality: Bilateral;  ? POLYPECTOMY N/A 06/02/2014  ? Procedure: POLYPECTOMY NASAL;  Surgeon: Osborn Coho, MD;  Location: [facility] OR;  Service: ENT;  Laterality: N/A;  ? ? ?History reviewed. No pertinent family history. ? ?Social History  ? ?Socioeconomic History  ? Marital status: Single  ?  Spouse name: Not on file  ? Number of children: Not on file  ? Years of education: Not on file  ? Highest education level: Not on file  ?Occupational History  ? Occupation: metal work  ?Tobacco Use  ? Smoking status: Former  ?  Packs/day: 0.50  ?  Years: 4.00  ?  Pack years: 2.00  ?  Types: Cigarettes  ?  Quit date: 02/11/1997  ?  Years since quitting: 24.3  ? Smokeless tobacco: Never  ?Substance and Sexual Activity  ? Alcohol use: No  ? Drug use: No  ? Sexual activity: Not on file  ?Other Topics Concern  ? Not on file  ?Social History Narrative  ? Not on file  ? ?Social Determinants of Health  ? ?Financial Resource Strain: Not on file  ?Food Insecurity: Not on file  ?Transportation Needs: Not on file  ?Physical Activity: Not on file  ?Stress: Not on file  ?Social Connections: Not on file  ?Intimate Partner Violence: Not on file  ? ? ?Review of Systems  ?All other systems reviewed and are negative. ? ?  ? ? ?Objective   ? ?BP (!) 135/91 (BP Location: Right Arm, Patient Position: Sitting, Cuff Size: Large)   Pulse 80   Temp 97.6 ?F (36.4 ?C) (Oral)   Resp 12   Wt 164 lb (74.4 kg)   SpO2 93%   BMI 21.64 kg/m?  ? ?Physical Exam ?Vitals and nursing note reviewed.  ?Constitutional:   ?   General: He is not in acute distress. ?Cardiovascular:  ?   Rate and Rhythm: Normal rate and regular rhythm.  ?Pulmonary:  ?   Effort: Pulmonary effort is normal.  ?   Breath sounds: Normal breath sounds.  ?Abdominal:  ?   Palpations: Abdomen is soft.  ?   Tenderness: There is no abdominal tenderness.  ?Musculoskeletal:  ?   Right lower leg: No edema.  ?   Left lower leg: No edema.  ?Neurological:  ?   General: No focal deficit present.  ?    Mental Status: He is alert and oriented to person, place, and time.  ? ? ? ?  ? ?Assessment & Plan:  ? ?1. Dermatitis ?Kenalog cream refilled ?- triamcinolone cream (KENALOG) 0.1 %; APPLY TO AFFECTED AREA TWICE A DAY  Dispense: 60 g; Refill: 1 ? ?2. Moderate persistent asthma without complication ?Meds refilled. Singulair prescribed to add to regimen. monitor ?- budesonide-formoterol (SYMBICORT) 160-4.5 MCG/ACT inhaler; Inhale 2 puffs into the lungs 2 (two) times daily.  Dispense: 1 each; Refill: 12 ? ?3. Gastroesophageal reflux disease without esophagitis ?Omeprazole refilled ? ?4. Language barrier to communication ? ? ? ?Return in about 3 months (around 09/03/2021) for  follow up.  ? ?Tommie RaymondWilson, Loyd Marhefka P, MD ? ? ?

## 2021-06-07 ENCOUNTER — Other Ambulatory Visit: Payer: Self-pay

## 2021-06-07 MED ORDER — ALBUTEROL SULFATE (2.5 MG/3ML) 0.083% IN NEBU
INHALATION_SOLUTION | RESPIRATORY_TRACT | 3 refills | Status: DC
  Filled 2021-06-07: qty 75, 5d supply, fill #0

## 2021-06-08 ENCOUNTER — Other Ambulatory Visit: Payer: Self-pay

## 2021-08-30 ENCOUNTER — Other Ambulatory Visit: Payer: Self-pay | Admitting: Family Medicine

## 2021-09-03 ENCOUNTER — Ambulatory Visit: Payer: Medicare PPO | Admitting: Family Medicine

## 2021-09-03 ENCOUNTER — Encounter: Payer: Self-pay | Admitting: Family Medicine

## 2021-09-03 ENCOUNTER — Other Ambulatory Visit: Payer: Self-pay

## 2021-09-03 ENCOUNTER — Ambulatory Visit (INDEPENDENT_AMBULATORY_CARE_PROVIDER_SITE_OTHER): Payer: Medicare PPO | Admitting: Family Medicine

## 2021-09-03 VITALS — BP 118/81 | HR 81 | Temp 98.1°F | Resp 16 | Ht 74.0 in | Wt 164.8 lb

## 2021-09-03 DIAGNOSIS — I10 Essential (primary) hypertension: Secondary | ICD-10-CM

## 2021-09-03 DIAGNOSIS — J310 Chronic rhinitis: Secondary | ICD-10-CM | POA: Diagnosis not present

## 2021-09-03 DIAGNOSIS — Z789 Other specified health status: Secondary | ICD-10-CM

## 2021-09-03 MED ORDER — FLUTICASONE PROPIONATE 50 MCG/ACT NA SUSP
2.0000 | Freq: Every day | NASAL | 1 refills | Status: DC
  Filled 2021-09-03: qty 16, 30d supply, fill #0

## 2021-09-03 MED ORDER — LISINOPRIL 30 MG PO TABS
30.0000 mg | ORAL_TABLET | Freq: Every day | ORAL | 1 refills | Status: DC
  Filled 2021-09-03: qty 30, 30d supply, fill #0

## 2021-09-05 NOTE — Progress Notes (Signed)
Established Patient Office Visit  Subjective    Patient ID: Nathaniel Hartman, male    DOB: 07-27-1950  Age: 71 y.o. MRN: [id number]  CC:  Chief Complaint  Patient presents with   Follow-up    HPI Nathaniel Hartman presents for routine follow up of chronic med issues. Patient denies acute complaints or concerns.   Outpatient Encounter Medications as of 09/03/2021  Medication Sig   albuterol (PROVENTIL) (2.5 MG/3ML) 0.083% nebulizer solution inhale 1 vial via nebulizer ever 4hrs as needed for wheezing / shortness of breath   budesonide-formoterol (SYMBICORT) 160-4.5 MCG/ACT inhaler Inhale 2 puffs into the lungs 2 (two) times daily.   cetirizine (ZYRTEC) 10 MG tablet Take 1 tablet (10 mg total) by mouth daily.   Famotidine (PEPCID PO) Take 1 tablet by mouth at bedtime.   fluticasone (FLONASE) 50 MCG/ACT nasal spray Place 2 sprays into both nostrils daily.   lisinopril (ZESTRIL) 30 MG tablet Take 1 tablet (30 mg total) by mouth daily.   montelukast (SINGULAIR) 10 MG tablet Take 1 tablet (10 mg total) by mouth at bedtime.   omeprazole (PRILOSEC OTC) 20 MG tablet Take 1 tablet (20 mg total) by mouth daily.   omeprazole (PRILOSEC) 20 MG capsule Take 20 mg by mouth daily.   PREVNAR 20 0.5 ML injection    triamcinolone cream (KENALOG) 0.1 % APPLY TO AFFECTED AREA TWICE A DAY   [DISCONTINUED] fluticasone (FLONASE) 50 MCG/ACT nasal spray Place into both nostrils daily.   [DISCONTINUED] lisinopril (ZESTRIL) 30 MG tablet TAKE 1 TABLET BY MOUTH EVERY DAY   No facility-administered encounter medications on file as of 09/03/2021.    Past Medical History:  Diagnosis Date   Asthma    Bilateral pneumonia 07/31/2007   Chronic cough    Chronic kidney disease    COPD (chronic obstructive pulmonary disease) (HCC)    GERD (gastroesophageal reflux disease)    pt denies this (05/31/14)   Hypertension    Pansinusitis    Shortness of breath     Past Surgical History:  Procedure Laterality Date   EYE  SURGERY Right 2008   cataract surgery   left hand surgery  08/2006   NASAL SINUS SURGERY Bilateral 06/02/2014   Procedure: BILATERAL ENDOSCOPIC SINUS SURGERY WITH FUSION ;  Surgeon: Osborn Coho, MD;  Location: [facility] OR;  Service: ENT;  Laterality: Bilateral;   POLYPECTOMY N/A 06/02/2014   Procedure: POLYPECTOMY NASAL;  Surgeon: Osborn Coho, MD;  Location: [facility] OR;  Service: ENT;  Laterality: N/A;    History reviewed. No pertinent family history.  Social History   Socioeconomic History   Marital status: Single    Spouse name: Not on file   Number of children: Not on file   Years of education: Not on file   Highest education level: Not on file  Occupational History   Occupation: metal work  Tobacco Use   Smoking status: Former    Packs/day: 0.50    Years: 4.00    Total pack years: 2.00    Types: Cigarettes    Quit date: 02/11/1997    Years since quitting: 24.5   Smokeless tobacco: Never  Substance and Sexual Activity   Alcohol use: No   Drug use: No   Sexual activity: Not on file  Other Topics Concern   Not on file  Social History Narrative   Not on file   Social Determinants of Health   Financial Resource Strain: Not on file  Food Insecurity: Not on file  Transportation  Needs: Not on file  Physical Activity: Not on file  Stress: Not on file  Social Connections: Not on file  Intimate Partner Violence: Not on file    Review of Systems  All other systems reviewed and are negative.       Objective    BP 118/81   Pulse 81   Temp 98.1 F (36.7 C) (Oral)   Resp 16   Ht 6\' 2"  (1.88 m)   Wt 164 lb 12.8 oz (74.8 kg)   SpO2 94%   BMI 21.16 kg/m   Physical Exam Vitals and nursing note reviewed.  Constitutional:      General: He is not in acute distress. Cardiovascular:     Rate and Rhythm: Normal rate and regular rhythm.  Pulmonary:     Effort: Pulmonary effort is normal.     Breath sounds: Normal breath sounds.  Abdominal:     Palpations: Abdomen is  soft.     Tenderness: There is no abdominal tenderness.  Musculoskeletal:     Right lower leg: No edema.     Left lower leg: No edema.  Neurological:     General: No focal deficit present.     Mental Status: He is alert and oriented to person, place, and time.         Assessment & Plan:   1. Essential hypertension Appears stable with present management. Continue. Meds refilled.   2. Chronic rhinitis Meds refilled. continue  3. Language barrier to communication     Return in about 6 months (around 03/06/2022) for follow up.   03/08/2022, MD

## 2021-09-10 ENCOUNTER — Other Ambulatory Visit: Payer: Self-pay

## 2021-11-02 ENCOUNTER — Telehealth: Payer: Self-pay | Admitting: *Deleted

## 2021-11-02 NOTE — Chronic Care Management (AMB) (Signed)
  Care Coordination  Outreach Note  11/02/2021 Name: Nathaniel Hartman MRN: [id number] DOB: 28-Aug-1950   Care Coordination Outreach Attempts: An unsuccessful telephone outreach was attempted today to offer the patient information about available care coordination services as a benefit of their health plan.   Follow Up Plan:  Additional outreach attempts will be made to offer the patient care coordination information and services.   Encounter Outcome:  No Answer   [place]  Direct Dial: [phone]

## 2021-11-05 NOTE — Chronic Care Management (AMB) (Signed)
  Care Coordination  Outreach Note  11/05/2021 Name: Corydon Schweiss MRN: [id number] DOB: 09-16-1950   Care Coordination Outreach Attempts: A second unsuccessful outreach was attempted today to offer the patient with information about available care coordination services as a benefit of their health plan.     Follow Up Plan:  Additional outreach attempts will be made to offer the patient care coordination information and services.   Encounter Outcome:  No Answer  [place]  Direct Dial: [phone]

## 2021-11-13 NOTE — Chronic Care Management (AMB) (Signed)
  Care Coordination  Outreach Note  11/13/2021 Name: Kamarius Buckbee MRN: [id number] DOB: Feb 19, 1950   Care Coordination Outreach Attempts: A third unsuccessful outreach was attempted today to offer the patient with information about available care coordination services as a benefit of their health plan.   Follow Up Plan:  No further outreach attempts will be made at this time. We have been unable to contact the patient to offer or enroll patient in care coordination services  Encounter Outcome:  No Answer  [place]: [phone]

## 2021-11-28 ENCOUNTER — Other Ambulatory Visit: Payer: Self-pay | Admitting: Family Medicine

## 2022-01-03 ENCOUNTER — Inpatient Hospital Stay (HOSPITAL_COMMUNITY)
Admission: EM | Admit: 2022-01-03 | Discharge: 2022-01-06 | DRG: 177 | Disposition: A | Discharge status: Home / Self Care | Payer: Medicare PPO | Attending: Internal Medicine | Admitting: Internal Medicine

## 2022-01-03 ENCOUNTER — Emergency Department (HOSPITAL_COMMUNITY): Payer: Medicare PPO

## 2022-01-03 ENCOUNTER — Encounter (HOSPITAL_COMMUNITY): Payer: Self-pay

## 2022-01-03 ENCOUNTER — Other Ambulatory Visit: Payer: Self-pay

## 2022-01-03 DIAGNOSIS — Z87891 Personal history of nicotine dependence: Secondary | ICD-10-CM | POA: Diagnosis not present

## 2022-01-03 DIAGNOSIS — Z7951 Long term (current) use of inhaled steroids: Secondary | ICD-10-CM | POA: Diagnosis not present

## 2022-01-03 DIAGNOSIS — U071 COVID-19: Secondary | ICD-10-CM | POA: Diagnosis present

## 2022-01-03 DIAGNOSIS — Z79899 Other long term (current) drug therapy: Secondary | ICD-10-CM

## 2022-01-03 DIAGNOSIS — I129 Hypertensive chronic kidney disease with stage 1 through stage 4 chronic kidney disease, or unspecified chronic kidney disease: Secondary | ICD-10-CM | POA: Diagnosis present

## 2022-01-03 DIAGNOSIS — J1282 Pneumonia due to coronavirus disease 2019: Secondary | ICD-10-CM | POA: Diagnosis present

## 2022-01-03 DIAGNOSIS — R0602 Shortness of breath: Secondary | ICD-10-CM | POA: Diagnosis not present

## 2022-01-03 DIAGNOSIS — J440 Chronic obstructive pulmonary disease with acute lower respiratory infection: Secondary | ICD-10-CM | POA: Diagnosis present

## 2022-01-03 DIAGNOSIS — J9601 Acute respiratory failure with hypoxia: Secondary | ICD-10-CM | POA: Diagnosis present

## 2022-01-03 DIAGNOSIS — K219 Gastro-esophageal reflux disease without esophagitis: Secondary | ICD-10-CM | POA: Diagnosis present

## 2022-01-03 DIAGNOSIS — Z603 Acculturation difficulty: Secondary | ICD-10-CM | POA: Diagnosis present

## 2022-01-03 DIAGNOSIS — N182 Chronic kidney disease, stage 2 (mild): Secondary | ICD-10-CM | POA: Diagnosis present

## 2022-01-03 DIAGNOSIS — R059 Cough, unspecified: Secondary | ICD-10-CM | POA: Diagnosis not present

## 2022-01-03 LAB — LIPASE, BLOOD: Lipase: 28 U/L (ref 11–51)

## 2022-01-03 LAB — COMPREHENSIVE METABOLIC PANEL
ALT: 27 U/L (ref 0–44)
AST: 32 U/L (ref 15–41)
Albumin: 2.3 g/dL — ABNORMAL LOW (ref 3.5–5.0)
Alkaline Phosphatase: 88 U/L (ref 38–126)
Anion gap: 10 (ref 5–15)
BUN: 21 mg/dL (ref 8–23)
CO2: 19 mmol/L — ABNORMAL LOW (ref 22–32)
Calcium: 8.5 mg/dL — ABNORMAL LOW (ref 8.9–10.3)
Chloride: 103 mmol/L (ref 98–111)
Creatinine, Ser: 1.44 mg/dL — ABNORMAL HIGH (ref 0.61–1.24)
GFR, Estimated: 51 mL/min — ABNORMAL LOW (ref >60)
Glucose, Bld: 102 mg/dL — ABNORMAL HIGH (ref 70–99)
Potassium: 4.4 mmol/L (ref 3.5–5.1)
Sodium: 132 mmol/L — ABNORMAL LOW (ref 135–145)
Total Bilirubin: 0.6 mg/dL (ref 0.3–1.2)
Total Protein: 6.8 g/dL (ref 6.5–8.1)

## 2022-01-03 LAB — CBC
HCT: 33.2 % — ABNORMAL LOW (ref 39.0–52.0)
HCT: 27.5 % — ABNORMAL LOW (ref 39.0–52.0)
Hemoglobin: 10.7 g/dL — ABNORMAL LOW (ref 13.0–17.0)
Hemoglobin: 9.5 g/dL — ABNORMAL LOW (ref 13.0–17.0)
MCH: 27 pg (ref 26.0–34.0)
MCH: 27.9 pg (ref 26.0–34.0)
MCHC: 32.2 g/dL (ref 30.0–36.0)
MCHC: 34.5 g/dL (ref 30.0–36.0)
MCV: 83.8 fL (ref 80.0–100.0)
MCV: 80.6 fL (ref 80.0–100.0)
Platelets: 242 10*3/uL (ref 150–400)
Platelets: 225 10*3/uL (ref 150–400)
RBC: 3.96 MIL/uL — ABNORMAL LOW (ref 4.22–5.81)
RBC: 3.41 MIL/uL — ABNORMAL LOW (ref 4.22–5.81)
RDW: 14.3 % (ref 11.5–15.5)
RDW: 14.3 % (ref 11.5–15.5)
WBC: 8.2 10*3/uL (ref 4.0–10.5)
WBC: 8 10*3/uL (ref 4.0–10.5)
nRBC: 0 % (ref 0.0–0.2)
nRBC: 0 % (ref 0.0–0.2)

## 2022-01-03 LAB — URINALYSIS, ROUTINE W REFLEX MICROSCOPIC
Bilirubin Urine: NEGATIVE
Glucose, UA: NEGATIVE mg/dL
Ketones, ur: NEGATIVE mg/dL
Leukocytes,Ua: NEGATIVE
Nitrite: NEGATIVE
Protein, ur: 30 mg/dL — AB
Specific Gravity, Urine: 1.01 (ref 1.005–1.030)
pH: 6 (ref 5.0–8.0)

## 2022-01-03 LAB — TROPONIN I (HIGH SENSITIVITY): Troponin I (High Sensitivity): 10 ng/L (ref <18)

## 2022-01-03 LAB — PROCALCITONIN: Procalcitonin: 0.79 ng/mL

## 2022-01-03 LAB — D-DIMER, QUANTITATIVE: D-Dimer, Quant: 0.92 ug/mL-FEU — ABNORMAL HIGH (ref 0.00–0.50)

## 2022-01-03 LAB — C-REACTIVE PROTEIN: CRP: 23.1 mg/dL — ABNORMAL HIGH (ref <1.0)

## 2022-01-03 LAB — RESP PANEL BY RT-PCR (FLU A&B, COVID) ARPGX2
Influenza A by PCR: NEGATIVE
Influenza B by PCR: NEGATIVE
SARS Coronavirus 2 by RT PCR: POSITIVE — AB

## 2022-01-03 LAB — CREATININE, SERUM
Creatinine, Ser: 1.14 mg/dL (ref 0.61–1.24)
GFR, Estimated: >60 mL/min (ref >60)

## 2022-01-03 LAB — SEDIMENTATION RATE: Sed Rate: 122 mm/hr — ABNORMAL HIGH (ref 0–16)

## 2022-01-03 MED ORDER — ENOXAPARIN SODIUM 40 MG/0.4ML IJ SOSY
40.0000 mg | PREFILLED_SYRINGE | INTRAMUSCULAR | Status: DC
  Administered 2022-01-03 – 2022-01-05 (×3): 40 mg via SUBCUTANEOUS
  Filled 2022-01-03 (×3): qty 0.4

## 2022-01-03 MED ORDER — METHYLPREDNISOLONE SODIUM SUCC 40 MG IJ SOLR: 40.0000 mg | Freq: Every day | INTRAMUSCULAR | Status: DC

## 2022-01-03 MED ORDER — SODIUM CHLORIDE 0.9 % IV SOLN
2.0000 g | INTRAVENOUS | Status: AC
  Administered 2022-01-04 – 2022-01-05 (×2): 2 g via INTRAVENOUS
  Filled 2022-01-03 (×2): qty 20

## 2022-01-03 MED ORDER — SODIUM CHLORIDE 0.9 % IV SOLN
2.0000 g | Freq: Once | INTRAVENOUS | Status: AC
  Administered 2022-01-03: 2 g via INTRAVENOUS
  Filled 2022-01-03: qty 20

## 2022-01-03 MED ORDER — SODIUM CHLORIDE 0.9 % IV SOLN
200.0000 mg | Freq: Once | INTRAVENOUS | Status: AC
  Administered 2022-01-03: 200 mg via INTRAVENOUS
  Filled 2022-01-03: qty 40

## 2022-01-03 MED ORDER — METHYLPREDNISOLONE SODIUM SUCC 125 MG IJ SOLR
80.0000 mg | Freq: Every day | INTRAMUSCULAR | Status: AC
  Administered 2022-01-04 – 2022-01-06 (×3): 80 mg via INTRAVENOUS
  Filled 2022-01-03 (×3): qty 2

## 2022-01-03 MED ORDER — ALBUTEROL (5 MG/ML) CONTINUOUS INHALATION SOLN
10.0000 mg/h | INHALATION_SOLUTION | Freq: Once | RESPIRATORY_TRACT | Status: AC
  Administered 2022-01-03: 10 mg/h via RESPIRATORY_TRACT
  Filled 2022-01-03: qty 20

## 2022-01-03 MED ORDER — PREDNISONE 20 MG PO TABS
60.0000 mg | ORAL_TABLET | Freq: Once | ORAL | Status: AC
  Administered 2022-01-03: 60 mg via ORAL
  Filled 2022-01-03: qty 3

## 2022-01-03 MED ORDER — IPRATROPIUM-ALBUTEROL 0.5-2.5 (3) MG/3ML IN SOLN
3.0000 mL | Freq: Once | RESPIRATORY_TRACT | Status: AC
  Administered 2022-01-03: 3 mL via RESPIRATORY_TRACT
  Filled 2022-01-03: qty 3

## 2022-01-03 MED ORDER — SODIUM CHLORIDE 0.9 % IV SOLN
100.0000 mg | Freq: Every day | INTRAVENOUS | Status: AC
  Administered 2022-01-04 – 2022-01-05 (×2): 100 mg via INTRAVENOUS
  Filled 2022-01-03 (×2): qty 20

## 2022-01-03 MED ORDER — MOMETASONE FURO-FORMOTEROL FUM 200-5 MCG/ACT IN AERO
2.0000 | INHALATION_SPRAY | Freq: Two times a day (BID) | RESPIRATORY_TRACT | Status: DC
  Administered 2022-01-04 – 2022-01-06 (×3): 2 via RESPIRATORY_TRACT
  Filled 2022-01-03 (×3): qty 8.8

## 2022-01-03 MED ORDER — IPRATROPIUM-ALBUTEROL 0.5-2.5 (3) MG/3ML IN SOLN
3.0000 mL | Freq: Four times a day (QID) | RESPIRATORY_TRACT | Status: DC
  Administered 2022-01-03 – 2022-01-04 (×4): 3 mL via RESPIRATORY_TRACT
  Filled 2022-01-03 (×5): qty 3

## 2022-01-03 MED ORDER — SODIUM CHLORIDE 0.9 % IV SOLN
500.0000 mg | INTRAVENOUS | Status: DC
  Administered 2022-01-03 – 2022-01-04 (×2): 500 mg via INTRAVENOUS
  Filled 2022-01-03 (×2): qty 5

## 2022-01-03 MED ORDER — ALBUTEROL SULFATE (2.5 MG/3ML) 0.083% IN NEBU
INHALATION_SOLUTION | RESPIRATORY_TRACT | Status: AC
  Filled 2022-01-03: qty 12

## 2022-01-03 MED ORDER — NYSTATIN 100000 UNIT/ML MT SUSP
5.0000 mL | Freq: Four times a day (QID) | OROMUCOSAL | Status: DC
  Administered 2022-01-03 – 2022-01-06 (×11): 500000 [IU] via ORAL
  Filled 2022-01-03 (×14): qty 5

## 2022-01-03 NOTE — ED Provider Triage Note (Signed)
Emergency Medicine Provider Triage Evaluation Note  Britney Captain , a 71 y.o. male  was evaluated in triage.  Pt complains of lack of appetite, nausea, vomiting, weakness, and fall 2/2 weakness. Reports more fatigue, with no unilateral numbness, weakness. No hx of stroke. Denies chest pain, but does endorse mild shortness of breath. No injuries reported from fall. Patient denies abdominal pain. Hx hypertension, asthma.  Review of Systems  Positive: Lack of appetite, nausea, shob Negative: Chest pain, abdominal pain, numbness  Physical Exam  BP 103/74 (BP Location: Right Arm)   Pulse 99   Temp 97.9 F (36.6 C) (Oral)   Resp 18   SpO2 90%  Gen:   Awake, no distress   Resp:  Normal effort  MSK:   Moves extremities without difficulty  Other:  No ttp abdomen, moves all 4 limbs spontaneously, cn2-12 grossly intact  Medical Decision Making  Medically screening exam initiated at 12:23 AM.  Appropriate orders placed.  Rydan Gulyas was informed that the remainder of the evaluation will be completed by another provider, this initial triage assessment does not replace that evaluation, and the importance of remaining in the ED until their evaluation is complete.  Workup initiated   Olene Floss, [place] 01/03/22 0025

## 2022-01-03 NOTE — Progress Notes (Addendum)
Patient is admitted this am, detail please see HPI,  He continue to have cough, on 4liter oxygen supplement, states appetite is better, he appear dehydrated,  wife at bedside . He does has thrush on tongue, no wheezing on exam, no edema. Continue current treatment , f/u on crp level, wean oxygen, possible discharge in 24-48 hours if able to wean off oxygen.

## 2022-01-03 NOTE — ED Notes (Signed)
Using [place] interpretor

## 2022-01-03 NOTE — ED Notes (Signed)
Received verbal report from Jacquelyn D RN at this time 

## 2022-01-03 NOTE — ED Notes (Signed)
MD at bedside. 

## 2022-01-03 NOTE — ED Provider Notes (Signed)
[facility] EMERGENCY DEPARTMENT Provider Note   CSN: [id number] Arrival date & time: 01/03/22  0008     History  Chief Complaint  Patient presents with   Weakness    Nathaniel Hartman is a 71 y.o. male.  The history is provided by the patient and the spouse. The history is limited by a language barrier. A language interpreter was used [place] [phone]).   Patient with history of COPD, chronic cough, hypertension presents with multiple complaints.  Most of the history is provided by his spouse.  His wife speaks vitamins, but patient speaks [place]  Over the past 2 days patient has had increasing cough, fevers and decreased appetite.  It is reported he has had vomiting.  He has had shortness of breath.  No chest pain.  He has generalized fatigue which caused him to fall but he landed on his arms and no head injury.  No traumatic injury.  He had no loss of consciousness  Patient is a non-smoker Past Medical History:  Diagnosis Date   Asthma    Bilateral pneumonia 07/31/2007   Chronic cough    Chronic kidney disease    COPD (chronic obstructive pulmonary disease) (HCC)    GERD (gastroesophageal reflux disease)    pt denies this (05/31/14)   Hypertension    Pansinusitis    Shortness of breath     Home Medications Prior to Admission medications   Medication Sig Start Date End Date Taking? Authorizing Provider  budesonide-formoterol (SYMBICORT) 160-4.5 MCG/ACT inhaler Inhale 2 puffs into the lungs 2 (two) times daily. 06/04/21  Yes Georganna Skeans, MD  cetirizine (ZYRTEC) 10 MG tablet Take 1 tablet (10 mg total) by mouth daily. 11/06/20  Yes Georganna Skeans, MD  lisinopril (ZESTRIL) 30 MG tablet TAKE 1 TABLET BY MOUTH EVERY DAY 11/28/21  Yes Georganna Skeans, MD  montelukast (SINGULAIR) 10 MG tablet Take 1 tablet (10 mg total) by mouth at bedtime. Patient taking differently: Take 10 mg by mouth daily as needed (for allergies). 06/04/21  Yes Georganna Skeans, MD   triamcinolone cream (KENALOG) 0.1 % APPLY TO AFFECTED AREA TWICE A DAY Patient taking differently: Apply 1 Application topically 2 (two) times daily. APPLY TO AFFECTED AREA TWICE A DAY 06/04/21  Yes Georganna Skeans, MD  albuterol (PROVENTIL) (2.5 MG/3ML) 0.083% nebulizer solution inhale 1 vial via nebulizer ever 4hrs as needed for wheezing / shortness of breath Patient not taking: Reported on 01/03/2022 06/04/21   Georganna Skeans, MD  fluticasone [facility]) 50 MCG/ACT nasal spray Place 2 sprays into both nostrils daily. Patient not taking: Reported on 01/03/2022 09/03/21   Georganna Skeans, MD      Allergies    Benadryl [diphenhydramine hcl (sleep)], Motrin [ibuprofen], and Nsaids    Review of Systems   Review of Systems  Constitutional:  Positive for fever.  Respiratory:  Positive for cough.     Physical Exam Updated Vital Signs BP 103/75   Pulse (!) 101   Temp 98.2 F (36.8 C) (Oral)   Resp 18   Ht 1.88 m (6\' 2" )   Wt 74.4 kg   SpO2 92%   BMI 21.06 kg/m  Physical Exam CONSTITUTIONAL: Elderly, no acute distress, thin appearing HEAD: Normocephalic/atraumatic EYES: EOMI/PERRL ENMT: Mucous membranes moist NECK: supple no meningeal signs, no JVD SPINE/BACK:entire spine nontender CV: S1/S2 noted, no murmurs/rubs/gallops noted LUNGS: Scattered wheezing, no acute distress, no crackles ABDOMEN: soft, nontender, no rebound or guarding, bowel sounds noted throughout abdomen GU:no cva tenderness NEURO: Pt is  awake/alert/appropriate, moves all extremitiesx4.  No facial droop.   EXTREMITIES: pulses normal/equal, full ROM, no lower extremity edema SKIN: warm, color normal PSYCH: no abnormalities of mood noted, alert and oriented to situation  ED Results / Procedures / Treatments   Labs (all labs ordered are listed, but only abnormal results are displayed) Labs Reviewed  RESP PANEL BY RT-PCR (FLU A&B, COVID) ARPGX2 - Abnormal; Notable for the following components:      Result Value    SARS Coronavirus 2 by RT PCR POSITIVE (*)    All other components within normal limits  CBC - Abnormal; Notable for the following components:   RBC 3.96 (*)    Hemoglobin 10.7 (*)    HCT 33.2 (*)    All other components within normal limits  COMPREHENSIVE METABOLIC PANEL - Abnormal; Notable for the following components:   Sodium 132 (*)    CO2 19 (*)    Glucose, Bld 102 (*)    Creatinine, Ser 1.44 (*)    Calcium 8.5 (*)    Albumin 2.3 (*)    GFR, Estimated 51 (*)    All other components within normal limits  URINALYSIS, ROUTINE W REFLEX MICROSCOPIC - Abnormal; Notable for the following components:   Hgb urine dipstick SMALL (*)    Protein, ur 30 (*)    Bacteria, UA RARE (*)    All other components within normal limits  LIPASE, BLOOD  TROPONIN I (HIGH SENSITIVITY)    EKG EKG Interpretation  Date/Time:  Thursday January 03 2022 00:21:27 EST Ventricular Rate:  97 PR Interval:  150 QRS Duration: 98 QT Interval:  352 QTC Calculation: 447 R Axis:   73 Text Interpretation: Normal sinus rhythm Normal ECG No significant change since last tracing Confirmed by Zadie Rhine ([id number]) on 01/03/2022 1:58:54 AM  Radiology DG Chest 2 View  Result Date: 01/03/2022 CLINICAL DATA:  Shortness of breath. EXAM: CHEST - 2 VIEW COMPARISON:  03/24/2021. FINDINGS: The heart is enlarged and the mediastinal contour is within normal limits. There is hyperinflation of the lungs. Mild peribronchial cuffing is noted bilaterally. Mild infrahilar airspace disease is noted on the right. No effusion or pneumothorax. Mild degenerative changes in the thoracic spine. IMPRESSION: 1. Minimal infrahilar airspace disease on the right, possible developing infiltrate. 2. Hyperinflation of the lungs with peribronchial cuffing, possible bronchitis or reactive airways disease. Electronically Signed   By: Thornell Sartorius M.D.   On: 01/03/2022 00:52    Procedures .Critical Care  Performed by: Zadie Rhine,  MD Authorized by: Zadie Rhine, MD   Critical care provider statement:    Critical care time (minutes):  45   Critical care start time:  01/03/2022 2:45 AM   Critical care end time:  01/03/2022 3:30 AM   Critical care time was exclusive of:  Separately billable procedures and treating other patients   Critical care was necessary to treat or prevent imminent or life-threatening deterioration of the following conditions:  Respiratory failure   Critical care was time spent personally by me on the following activities:  Obtaining history from patient or surrogate, development of treatment plan with patient or surrogate, examination of patient, evaluation of patient's response to treatment, ordering and review of radiographic studies, ordering and review of laboratory studies, pulse oximetry, re-evaluation of patient's condition and ordering and performing treatments and interventions   I assumed direction of critical care for this patient from another provider in my specialty: no     Care discussed with: admitting provider  Medications Ordered in ED Medications  albuterol (PROVENTIL) (2.5 MG/3ML) 0.083% nebulizer solution (  Not Given 01/03/22 0524)  ipratropium-albuterol (DUONEB) 0.5-2.5 (3) MG/3ML nebulizer solution 3 mL (3 mLs Nebulization Given 01/03/22 0245)  predniSONE (DELTASONE) tablet 60 mg (60 mg Oral Given 01/03/22 0338)  albuterol (PROVENTIL,VENTOLIN) solution continuous neb (10 mg/hr Nebulization Given 01/03/22 0351)    ED Course/ Medical Decision Making/ A&P Clinical Course as of 01/03/22 0537  Thu Jan 03, 2022  0345 Patient with desaturation on ambulation, will give continuous nebulizer [DW]    Clinical Course User Index [DW] Zadie Rhine, MD   Patient with episodes of hypoxia on ambulation as well as at rest.  Patient found to be positive for COVID-19.  Patient still with wheezing, continue neb treatments and admission.                          Medical  Decision Making Risk Prescription drug management. Decision regarding hospitalization.   This patient presents to the ED for concern of cough and weakness, this involves an extensive number of treatment options, and is a complaint that carries with it a high risk of complications and morbidity.  The differential diagnosis includes but is not limited to Acute coronary syndrome, pneumonia, acute pulmonary edema, pneumothorax, acute anemia, pulmonary embolism    Comorbidities that complicate the patient evaluation: Patient's presentation is complicated by their history of COPD  Social Determinants of Health: Patient's  English is a second language   increases the complexity of managing their presentation  Additional history obtained: Additional history obtained from spouse Records reviewed Primary Care Documents  Lab Tests: I Ordered, and personally interpreted labs.  The pertinent results include: Renal insufficiency  Imaging Studies ordered: I ordered imaging studies including X-ray chest   I independently visualized and interpreted imaging which showed questionable pneumonia I agree with the radiologist interpretation  Cardiac Monitoring: The patient was maintained on a cardiac monitor.  I personally viewed and interpreted the cardiac monitor which showed an underlying rhythm of:  sinus rhythm  Medicines ordered and prescription drug management: I ordered medication including nebulized therapies and prednisone for wheezing Reevaluation of the patient after these medicines showed that the patient    stayed the same   Critical Interventions:   nebulized treatment, admission  Consultations Obtained: I requested consultation with the admitting physician Dr. Margo Aye , and discussed  findings as well as pertinent plan - they recommend: We will admit  Reevaluation: After the interventions noted above, I reevaluated the patient and found that they have :stayed the same  Complexity  of problems addressed: Patient's presentation is most consistent with  acute presentation with potential threat to life or bodily function  Disposition: After consideration of the diagnostic results and the patient's response to treatment,  I feel that the patent would benefit from admission   .           Final Clinical Impression(s) / ED Diagnoses Final diagnoses:  Acute respiratory failure with hypoxia (HCC)  COVID-19    Rx / DC Orders ED Discharge Orders     None         Zadie Rhine, MD 01/03/22 [phone]

## 2022-01-03 NOTE — H&P (Signed)
History and Physical  Nathaniel Hartman IRC:[id number] DOB: 01-Aug-1950 DOA: 01/03/2022  Referring physician: Dr. Bebe Shaggy, EDP  PCP: Georganna Skeans, MD  Outpatient Specialists: None. Patient coming from: Home.  Chief Complaint: Wet cough, generalized weakness, shortness of breath.  HPI: Nathaniel Hartman is a 71 y.o. male with medical history significant for essential hypertension, COPD, not on oxygen supplementation at baseline, who presented to [facility] ED due to persistent productive cough x 5 days.  Associated with generalized weakness and exertional shortness of breath.  Endorses subjective fevers, decrease in appetite and poor oral intake.  No abdominal pain or diarrhea.  In the ED workup revealed acute hypoxic respiratory failure.  COVID-19 screening test positive.  Chest x-ray showing right pulmonary infiltrates.  The patient was started on remdesivir and IV Solu-Medrol.  Due to concern for possible superimposed bacterial pneumonia or infection, the patient was started on IV antibiotics to cover for CAP.  TRH, hospitalist service, was asked to admit.  ED Course: Tmax 98.2.  BP 109/78, pulse 98, respiratory 22, O2 saturation 91% on 4 L.  Lab studies remarkable for serum sodium 132, bicarb 19, glucose 102, creatinine 1.44 with GFR 51.  Hemoglobin 10.7.  Review of Systems: Review of systems as noted in the HPI. All other systems reviewed and are negative.   Past Medical History:  Diagnosis Date   Asthma    Bilateral pneumonia 07/31/2007   Chronic cough    Chronic kidney disease    COPD (chronic obstructive pulmonary disease) (HCC)    GERD (gastroesophageal reflux disease)    pt denies this (05/31/14)   Hypertension    Pansinusitis    Shortness of breath    Past Surgical History:  Procedure Laterality Date   EYE SURGERY Right 2008   cataract surgery   left hand surgery  08/2006   NASAL SINUS SURGERY Bilateral 06/02/2014   Procedure: BILATERAL ENDOSCOPIC SINUS SURGERY WITH FUSION ;   Surgeon: Osborn Coho, MD;  Location: [facility] OR;  Service: ENT;  Laterality: Bilateral;   POLYPECTOMY N/A 06/02/2014   Procedure: POLYPECTOMY NASAL;  Surgeon: Osborn Coho, MD;  Location: [facility] OR;  Service: ENT;  Laterality: N/A;    Social History:  reports that he quit smoking about 24 years ago. His smoking use included cigarettes. He has a 2.00 pack-year smoking history. He has never used smokeless tobacco. He reports that he does not drink alcohol and does not use drugs.   Allergies  Allergen Reactions   Benadryl [Diphenhydramine Hcl (Sleep)] Shortness Of Breath   Motrin [Ibuprofen] Shortness Of Breath   Nsaids Shortness Of Breath    Family history: Patient parents died in the War when he was very young.  Per the patient they were healthy.  Prior to Admission medications   Medication Sig Start Date End Date Taking? Authorizing Provider  budesonide-formoterol (SYMBICORT) 160-4.5 MCG/ACT inhaler Inhale 2 puffs into the lungs 2 (two) times daily. 06/04/21  Yes Georganna Skeans, MD  cetirizine (ZYRTEC) 10 MG tablet Take 1 tablet (10 mg total) by mouth daily. 11/06/20  Yes Georganna Skeans, MD  lisinopril (ZESTRIL) 30 MG tablet TAKE 1 TABLET BY MOUTH EVERY DAY 11/28/21  Yes Georganna Skeans, MD  montelukast (SINGULAIR) 10 MG tablet Take 1 tablet (10 mg total) by mouth at bedtime. Patient taking differently: Take 10 mg by mouth daily as needed (for allergies). 06/04/21  Yes Georganna Skeans, MD  triamcinolone cream (KENALOG) 0.1 % APPLY TO AFFECTED AREA TWICE A DAY Patient taking differently: Apply 1 Application topically  2 (two) times daily. APPLY TO AFFECTED AREA TWICE A DAY 06/04/21  Yes Georganna Skeans, MD  albuterol (PROVENTIL) (2.5 MG/3ML) 0.083% nebulizer solution inhale 1 vial via nebulizer ever 4hrs as needed for wheezing / shortness of breath Patient not taking: Reported on 01/03/2022 06/04/21   Georganna Skeans, MD  fluticasone [facility]) 50 MCG/ACT nasal spray Place 2 sprays into both nostrils  daily. Patient not taking: Reported on 01/03/2022 09/03/21   Georganna Skeans, MD    Physical Exam: BP 108/76   Pulse 98   Temp 97.9 F (36.6 C) (Oral)   Resp (!) 30   Ht 6\' 2"  (1.88 m)   Wt 74.4 kg   SpO2 90%   BMI 21.06 kg/m   General: 71 y.o. year-old male well developed well nourished in no acute distress.  Alert and oriented x3. Cardiovascular: Regular rate and rhythm with no rubs or gallops.  No thyromegaly or JVD noted.  No lower extremity edema. 2/4 pulses in all 4 extremities. Respiratory: Mild rales noted at bases.  Diffuse wheezing noted.  Poor inspiratory effort. Abdomen: Soft nontender nondistended with normal bowel sounds x4 quadrants. Muskuloskeletal: No cyanosis, clubbing or edema noted bilaterally Neuro: CN II-XII intact, strength, sensation, reflexes Skin: No ulcerative lesions noted or rashes Psychiatry: Judgement and insight appear normal. Mood is appropriate for condition and setting          Labs on Admission:  Basic Metabolic Panel: Recent Labs  Lab 01/03/22 0035  NA 132*  K 4.4  CL 103  CO2 19*  GLUCOSE 102*  BUN 21  CREATININE 1.44*  CALCIUM 8.5*   Liver Function Tests: Recent Labs  Lab 01/03/22 0035  AST 32  ALT 27  ALKPHOS 88  BILITOT 0.6  PROT 6.8  ALBUMIN 2.3*   Recent Labs  Lab 01/03/22 0035  LIPASE 28   No results for input(s): "AMMONIA" in the last 168 hours. CBC: Recent Labs  Lab 01/03/22 0035  WBC 8.2  HGB 10.7*  HCT 33.2*  MCV 83.8  PLT 242   Cardiac Enzymes: No results for input(s): "CKTOTAL", "CKMB", "CKMBINDEX", "TROPONINI" in the last 168 hours.  BNP (last 3 results) No results for input(s): "BNP" in the last 8760 hours.  ProBNP (last 3 results) No results for input(s): "PROBNP" in the last 8760 hours.  CBG: No results for input(s): "GLUCAP" in the last 168 hours.  Radiological Exams on Admission: DG Chest 2 View  Result Date: 01/03/2022 CLINICAL DATA:  Shortness of breath. EXAM: CHEST - 2 VIEW  COMPARISON:  03/24/2021. FINDINGS: The heart is enlarged and the mediastinal contour is within normal limits. There is hyperinflation of the lungs. Mild peribronchial cuffing is noted bilaterally. Mild infrahilar airspace disease is noted on the right. No effusion or pneumothorax. Mild degenerative changes in the thoracic spine. IMPRESSION: 1. Minimal infrahilar airspace disease on the right, possible developing infiltrate. 2. Hyperinflation of the lungs with peribronchial cuffing, possible bronchitis or reactive airways disease. Electronically Signed   By: 05/22/2021 M.D.   On: 01/03/2022 00:52    EKG: I independently viewed the EKG done and my findings are as followed: Normal sinus rhythm rate of 97.  Nonspecific ST-T changes.  QTc 447.  Assessment/Plan Present on Admission:  COVID-19 virus infection  Principal Problem:   COVID-19 virus infection  COVID-19 viral infection with concern for superimposed bacterial pulmonary infection, POA Started on remdesivir, Rocephin, IV azithromycin Obtain baseline inflammatory markers. Obtain procalcitonin level Monitor fever curve and WBC  Acute  hypoxic respiratory failure secondary to above IV Solu-Medrol 40 mg twice daily x 3 days Not on oxygen supplementation at baseline Currently on 4 L to maintain O2 saturation greater than 92% Wean off oxygen supplementation as tolerated. DuoNebs every 6 hours Incentive spirometer Mobilize as tolerated.  Anorexia, likely secondary to COVID-19 viral infection Encourage oral intake as tolerated Liberalize diet if indicated.  Essential hypertension BPs are soft Hold off home oral antihypertensives for now Monitor vital signs.  Generalized weakness PT OT assessment Fall precautions   DVT prophylaxis: Subcu Lovenox daily  Code Status: Full code  Family Communication: Wife at bedside.  Disposition Plan: Admitted to telemetry medical unit  Consults called: None.  Admission status: Observation  status.   Status is: Observation    Darlin Drop MD [facility] Pager [phone]  If 7PM-7AM, please contact night-coverage [email or web address] Password [facility]  01/03/2022, 6:24 AM

## 2022-01-03 NOTE — ED Notes (Signed)
Pt moved to room 38 at this time 

## 2022-01-03 NOTE — ED Notes (Signed)
Dr. Margo Aye sent a message reference pt diet and no order for same at this time.

## 2022-01-03 NOTE — ED Notes (Signed)
While ambulating pt O2 sats dropped to 84%, pt denies feeling an increase in shortness of breath. After returning to bed O2 returned to to 92% on RA after a couple of minutes. Provider aware.

## 2022-01-03 NOTE — ED Triage Notes (Addendum)
Triage done using Vietnamese ipad interpreter: Pt reports decreased appetite and vomiting x 2 days. He denies abd pain but does endorse SOB. He has been generally weak as well which caused him to fall last night. No LOC, no injuries, no blood thinners.

## 2022-01-04 ENCOUNTER — Observation Stay (HOSPITAL_COMMUNITY): Payer: Medicare PPO

## 2022-01-04 DIAGNOSIS — Z87891 Personal history of nicotine dependence: Secondary | ICD-10-CM | POA: Diagnosis not present

## 2022-01-04 DIAGNOSIS — N182 Chronic kidney disease, stage 2 (mild): Secondary | ICD-10-CM | POA: Diagnosis present

## 2022-01-04 DIAGNOSIS — J440 Chronic obstructive pulmonary disease with acute lower respiratory infection: Secondary | ICD-10-CM | POA: Diagnosis present

## 2022-01-04 DIAGNOSIS — U071 COVID-19: Secondary | ICD-10-CM | POA: Diagnosis present

## 2022-01-04 DIAGNOSIS — J1282 Pneumonia due to coronavirus disease 2019: Secondary | ICD-10-CM | POA: Diagnosis present

## 2022-01-04 DIAGNOSIS — I129 Hypertensive chronic kidney disease with stage 1 through stage 4 chronic kidney disease, or unspecified chronic kidney disease: Secondary | ICD-10-CM | POA: Diagnosis present

## 2022-01-04 DIAGNOSIS — J9601 Acute respiratory failure with hypoxia: Secondary | ICD-10-CM | POA: Diagnosis present

## 2022-01-04 DIAGNOSIS — R059 Cough, unspecified: Secondary | ICD-10-CM | POA: Diagnosis not present

## 2022-01-04 DIAGNOSIS — K219 Gastro-esophageal reflux disease without esophagitis: Secondary | ICD-10-CM | POA: Diagnosis present

## 2022-01-04 DIAGNOSIS — Z603 Acculturation difficulty: Secondary | ICD-10-CM | POA: Diagnosis present

## 2022-01-04 DIAGNOSIS — Z7951 Long term (current) use of inhaled steroids: Secondary | ICD-10-CM | POA: Diagnosis not present

## 2022-01-04 DIAGNOSIS — Z79899 Other long term (current) drug therapy: Secondary | ICD-10-CM | POA: Diagnosis not present

## 2022-01-04 LAB — C-REACTIVE PROTEIN: CRP: 23.2 mg/dL — ABNORMAL HIGH (ref <1.0)

## 2022-01-04 LAB — CBC WITH DIFFERENTIAL/PLATELET
Abs Immature Granulocytes: 0.06 10*3/uL (ref 0.00–0.07)
Basophils Absolute: 0 10*3/uL (ref 0.0–0.1)
Basophils Relative: 0 %
Eosinophils Absolute: 0 10*3/uL (ref 0.0–0.5)
Eosinophils Relative: 0 %
HCT: 31.3 % — ABNORMAL LOW (ref 39.0–52.0)
Hemoglobin: 10.8 g/dL — ABNORMAL LOW (ref 13.0–17.0)
Immature Granulocytes: 1 %
Lymphocytes Relative: 11 %
Lymphs Abs: 1.3 10*3/uL (ref 0.7–4.0)
MCH: 27.5 pg (ref 26.0–34.0)
MCHC: 34.5 g/dL (ref 30.0–36.0)
MCV: 79.6 fL — ABNORMAL LOW (ref 80.0–100.0)
Monocytes Absolute: 0.6 10*3/uL (ref 0.1–1.0)
Monocytes Relative: 5 %
Neutro Abs: 10 10*3/uL — ABNORMAL HIGH (ref 1.7–7.7)
Neutrophils Relative %: 83 %
Platelets: 288 10*3/uL (ref 150–400)
RBC: 3.93 MIL/uL — ABNORMAL LOW (ref 4.22–5.81)
RDW: 14.1 % (ref 11.5–15.5)
WBC: 12 10*3/uL — ABNORMAL HIGH (ref 4.0–10.5)
nRBC: 0 % (ref 0.0–0.2)

## 2022-01-04 LAB — MAGNESIUM: Magnesium: 2.1 mg/dL (ref 1.7–2.4)

## 2022-01-04 LAB — BASIC METABOLIC PANEL
Anion gap: 9 (ref 5–15)
BUN: 24 mg/dL — ABNORMAL HIGH (ref 8–23)
CO2: 22 mmol/L (ref 22–32)
Calcium: 8.6 mg/dL — ABNORMAL LOW (ref 8.9–10.3)
Chloride: 104 mmol/L (ref 98–111)
Creatinine, Ser: 1.29 mg/dL — ABNORMAL HIGH (ref 0.61–1.24)
GFR, Estimated: 59 mL/min — ABNORMAL LOW (ref >60)
Glucose, Bld: 103 mg/dL — ABNORMAL HIGH (ref 70–99)
Potassium: 4.7 mmol/L (ref 3.5–5.1)
Sodium: 135 mmol/L (ref 135–145)

## 2022-01-04 LAB — PHOSPHORUS: Phosphorus: 3.6 mg/dL (ref 2.5–4.6)

## 2022-01-04 MED ORDER — ENSURE ENLIVE PO LIQD
237.0000 mL | Freq: Two times a day (BID) | ORAL | Status: DC
  Administered 2022-01-05 (×2): 237 mL via ORAL

## 2022-01-04 MED ORDER — GUAIFENESIN ER 600 MG PO TB12
600.0000 mg | ORAL_TABLET | Freq: Two times a day (BID) | ORAL | Status: DC
  Administered 2022-01-04 – 2022-01-06 (×5): 600 mg via ORAL
  Filled 2022-01-04 (×5): qty 1

## 2022-01-04 MED ORDER — ADULT MULTIVITAMIN W/MINERALS CH
1.0000 | ORAL_TABLET | Freq: Every day | ORAL | Status: DC
  Administered 2022-01-04 – 2022-01-06 (×3): 1 via ORAL
  Filled 2022-01-04 (×3): qty 1

## 2022-01-04 MED ORDER — MONTELUKAST SODIUM 10 MG PO TABS
10.0000 mg | ORAL_TABLET | Freq: Every day | ORAL | Status: DC
  Administered 2022-01-04 – 2022-01-05 (×2): 10 mg via ORAL
  Filled 2022-01-04 (×2): qty 1

## 2022-01-04 MED ORDER — AZITHROMYCIN 500 MG PO TABS
500.0000 mg | ORAL_TABLET | Freq: Every day | ORAL | Status: DC
  Administered 2022-01-05 – 2022-01-06 (×2): 500 mg via ORAL
  Filled 2022-01-04 (×2): qty 1

## 2022-01-04 NOTE — Evaluation (Signed)
Physical Therapy Evaluation Patient Details Name: Nathaniel Hartman MRN: [id number] DOB: 25-May-1950 Today's Date: 01/04/2022  History of Present Illness  71 yo male presents on 01/03/22 with R  pulmonary infiltrates, COVID (+) and possible PNA, thrush on tongue.  PMH: HTN, COPD, CKD  Clinical Impression  Pt admitted with above diagnosis. Pt received in bed, wife present and son on speaker phone. Pt independent at baseline. Son reports that his SPO2 low 90's at baseline. Upper 80's on RA today. 2L O2 applied and SPO2 increased minimally to 90% with no change in pt's symptoms which include mild SOB with exertion. Of note, pt did have HR fluctuation with mobility from 77bpm to 120 bpm and back several times. Pt ambulated 150' without AD with good pace. Will follow acutely but no needs at d/c.  Pt currently with functional limitations due to the deficits listed below (see PT Problem List). Pt will benefit from skilled PT to increase their independence and safety with mobility to allow discharge to the venue listed below.          Recommendations for follow up therapy are one component of a multi-disciplinary discharge planning process, led by the attending physician.  Recommendations may be updated based on patient status, additional functional criteria and insurance authorization.  Follow Up Recommendations No PT follow up      Assistance Recommended at Discharge PRN  Patient can return home with the following  Help with stairs or ramp for entrance;Assistance with cooking/housework    Equipment Recommendations None recommended by PT  Recommendations for Other Services       Functional Status Assessment Patient has had a recent decline in their functional status and demonstrates the ability to make significant improvements in function in a reasonable and predictable amount of time.     Precautions / Restrictions Precautions Precautions: Other (comment) Precaution Comments: watch  HR Restrictions Weight Bearing Restrictions: No      Mobility  Bed Mobility Overal bed mobility: Modified Independent                  Transfers Overall transfer level: Needs assistance Equipment used: None Transfers: Sit to/from Stand Sit to Stand: Modified independent (Device/Increase time)           General transfer comment: no assist needed for sit>stand    Ambulation/Gait Ambulation/Gait assistance: Supervision Gait Distance (Feet): 150 Feet Assistive device: None Gait Pattern/deviations: Step-through pattern Gait velocity: WFL Gait velocity interpretation: >4.37 ft/sec, indicative of normal walking speed   General Gait Details: pt with good pace, mild SOB noted during conversation but pt able to converse throughout. HR fluctuated between 77 and 120 bpm, informed RN of this as pt did not have telemetry on. SPO2 remained 87-88% on RA. Pr's son reports pt's normal sats are low 90's with his COPD.  Stairs            Wheelchair Mobility    Modified Rankin (Stroke Patients Only)       Balance Overall balance assessment: No apparent balance deficits (not formally assessed)                                           Pertinent Vitals/Pain Pain Assessment Pain Assessment: No/denies pain    Home Living Family/patient expects to be discharged to:: Private residence Living Arrangements: Spouse/significant other Available Help at Discharge: Family;Available 24 hours/day Type of Home:  House Home Access: Stairs to enter Entrance Stairs-Rails: Right Entrance Stairs-Number of Steps: 5   Home Layout: One level Home Equipment: None Additional Comments: pt lives with wife. Son in [place], was on speaker during session. Pt retired from [employer]    Prior Function Prior Level of Function : Independent/Modified Independent;Driving             Mobility Comments: drives, does not use AD. 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 ADLs Comments: independent     Higher education careers adviser        Extremity/Trunk Assessment   Upper Extremity Assessment Upper Extremity Assessment: Defer to OT evaluation    Lower Extremity Assessment Lower Extremity Assessment: Overall WFL for tasks assessed    Cervical / Trunk Assessment Cervical / Trunk Assessment: Normal  Communication   Communication: Prefers language other than English (vietnamese)  Cognition Arousal/Alertness: Awake/alert Behavior During Therapy: WFL for tasks assessed/performed Overall Cognitive Status: Within Functional Limits for tasks assessed                                          General Comments General comments (skin integrity, edema, etc.): Placed pt on 2L O2 and SPO2 increased to 90%, symptomatically pt could not tell a difference between having O2 on or off.    Exercises     Assessment/Plan    PT Assessment Patient needs continued PT services  PT Problem List Cardiopulmonary status limiting activity;Decreased activity tolerance;Decreased mobility       PT Treatment Interventions Gait training;Stair training;Functional mobility training;Therapeutic activities;Therapeutic exercise;Balance training;Patient/family education    PT Goals (Current goals can be found in the Care Plan section)  Acute Rehab PT Goals Patient Stated Goal: return home PT Goal Formulation: With patient/family Time For Goal Achievement: 01/18/22 Potential to Achieve Goals: Good    Frequency Min 3X/week     Co-evaluation               AM-PAC PT "6 Clicks" Mobility  Outcome Measure Help needed turning from your back to your side while in a flat bed without using bedrails?: None Help needed moving from lying on your back to sitting on the side of a flat bed without using bedrails?: None Help needed moving to and from a bed to a chair (including a wheelchair)?: None Help needed standing up from a chair using your arms (e.g., wheelchair or  bedside chair)?: None Help needed to walk in hospital room?: A Little Help needed climbing 3-5 steps with a railing? : A Little 6 Click Score: 22    End of Session Equipment Utilized During Treatment: Oxygen Activity Tolerance: Patient tolerated treatment well Patient left: in bed;with call bell/phone within reach;with family/visitor present Nurse Communication: Mobility status PT Visit Diagnosis: Other abnormalities of gait and mobility (R26.89)    Time: 4742-5956 PT Time Calculation (min) (ACUTE ONLY): 19 min   Charges:   PT Evaluation $PT Eval Moderate Complexity: 1 Mod          Lyanne Co, PT  Acute Rehab Services Secure chat preferred Office [phone]   Lawana Chambers Malaia Buchta 01/04/2022, 4:24 PM

## 2022-01-04 NOTE — Progress Notes (Signed)
Initial Nutrition Assessment  DOCUMENTATION CODES:   Not applicable  INTERVENTION:  Liberalize diet from a heart healthy to a regular diet to provide widest variety of menu options to enhance nutritional adequacy Ensure Enlive po BID, each supplement provides 350 kcal and 20 grams of protein. MVI with minerals daily  NUTRITION DIAGNOSIS:   Increased nutrient needs related to acute illness as evidenced by estimated needs.  GOAL:   Patient will meet greater than or equal to 90% of their needs  MONITOR:   PO intake, Supplement acceptance, Labs, Weight trends  REASON FOR ASSESSMENT:   Malnutrition Screening Tool    ASSESSMENT:   Pt admitted with wet cough, generalized weakness, SOB secondary to COVID-19. PMH significant for HTN, COPD.  Pt noted to have had decreased appetite and poor PO intake upon admission. No abdominal pain or diarrhea. Pt also being treated for thrush.   Pt with other providers at time of visit. Will obtain detailed nutrition related history at follow up. Per MD note yesterday, his appetite was improved. Plans to d/c once able to wean oxygen.   Reviewed weight history in chart. It appears that his weight has remained stable at 74 kg within the last 7 months. Will continue to monitor weight trends throughout admission.    Medications: solu-medrol, nystatin, IV abx  Labs: BUN 24, Cr 1.29  NUTRITION - FOCUSED PHYSICAL EXAM: Deferred to follow up.   Diet Order:   Diet Order             Diet regular Room service appropriate? Yes; Fluid consistency: Thin  Diet effective now                   EDUCATION NEEDS:   No education needs have been identified at this time  Skin:  Skin Assessment: Reviewed RN Assessment  Last BM:  11/23  Height:   Ht Readings from Last 1 Encounters:  01/03/22 6\' 2"  (1.88 m)    Weight:   Wt Readings from Last 1 Encounters:  01/03/22 74.4 kg   BMI:  Body mass index is 21.06 kg/m.  Estimated Nutritional  Needs:   Kcal:  2000-2200  Protein:  100-115g  Fluid:  >/=2L  01/05/22, RDN, LDN Clinical Nutrition

## 2022-01-04 NOTE — Progress Notes (Addendum)
PROGRESS NOTE    Nathaniel Hartman  IEP:[id number] DOB: 09-Oct-1950 DOA: 01/03/2022 PCP: Georganna Skeans, MD     Brief Narrative:    essential hypertension, COPD, not on oxygen supplementation at baseline, who presented to [facility] ED due to persistent productive cough x 5 days.  Associated with generalized weakness and exertional shortness of breath.  Endorses subjective fevers, decrease in appetite and poor oral intake.     Subjective:  He is upset that he has issues with his IV, upset that waited for a long time to have someone come into his room  He continue to cough, currently on room air, crp remain elevated  Assessment & Plan:  Principal Problem:   COVID-19 virus infection   Covid 19 pneumonia/acute respiratory failure Georgeann Oppenheim Leeanne Deed -initial cxr showed "1. Minimal infrahilar airspace disease on the right, possible developing infiltrate. 2. Hyperinflation of the lungs with peribronchial cuffing, possible bronchitis or reactive airways disease." -continue current regimen due to elevated crp/procalcitonin -repeat cxr -trend CRP/ procalcitonin  COPD Currently no wheezing Already on abx/steroids/nebs    CKDII/ IIIa Cr appear close to baseline Renal dosing meds     HTN Bp stable , continue hold home meds lisinopril      I have Reviewed nursing notes, Vitals, pain scores, I/o's, Lab results and  imaging results since pt's last encounter, details please see discussion above  I ordered the following labs:  Unresulted Labs (From admission, onward)     Start     Ordered   01/10/22 0500  Creatinine, serum  (enoxaparin (LOVENOX)    CrCl >/= 30 ml/min)  Weekly,   R     Comments: while on enoxaparin therapy    01/03/22 0622   01/05/22 0500  CBC with Differential/Platelet  Tomorrow morning,   R        01/04/22 1348   01/05/22 0500  Comprehensive metabolic panel  Tomorrow morning,   R        01/04/22 1348   01/05/22 0500  C-reactive protein  Daily at 5am,   R       01/04/22 1348   01/05/22 0500  Procalcitonin  Daily at 5am,   R      01/04/22 1349             DVT prophylaxis: enoxaparin (LOVENOX) injection 40 mg Start: 01/03/22 1600   Code Status:   Code Status: Full Code  Family Communication:  wife over the phone Disposition:   Dispo: The patient is from: home              Anticipated d/c is to: home              Anticipated d/c date is: 24-48hrs pending clinical improvement, crp/procalcitonin/cxr  Antimicrobials:        Objective: Vitals:   01/04/22 0523 01/04/22 0841 01/04/22 0906 01/04/22 1317  BP: 124/84     Pulse: 81     Resp: 15     Temp: 98.5 F (36.9 C)     TempSrc: Oral     SpO2: 95% 94% 94% 91%  Weight:      Height:        Intake/Output Summary (Last 24 hours) at 01/04/2022 1401 Last data filed at 01/04/2022 0616 Gross per 24 hour  Intake 351.48 ml  Output --  Net 351.48 ml   Filed Weights   01/03/22 0024  Weight: 74.4 kg    Examination:  General exam: upset Respiratory system: Clear to auscultation. Respiratory  effort normal. Cardiovascular system:  RRR.  Gastrointestinal system: Abdomen is nondistended, soft and nontender.  Normal bowel sounds heard. Central nervous system: Alert and oriented. No focal neurological deficits. Extremities:  no edema Skin: No rashes, lesions or ulcers Psychiatry: upset    Data Reviewed: I have personally reviewed  labs and visualized  imaging studies since the last encounter and formulate the plan        Scheduled Meds:  enoxaparin (LOVENOX) injection  40 mg Subcutaneous Q24H   feeding supplement  237 mL Oral BID BM   guaiFENesin  600 mg Oral BID   ipratropium-albuterol  3 mL Nebulization Q6H   methylPREDNISolone (SOLU-MEDROL) injection  80 mg Intravenous Daily   mometasone-formoterol  2 puff Inhalation BID   montelukast  10 mg Oral QHS   multivitamin with minerals  1 tablet Oral Daily   nystatin  5 mL Oral QID   Continuous Infusions:  azithromycin  500 mg (01/04/22 0940)   cefTRIAXone (ROCEPHIN)  IV Stopped (01/04/22 0552)   remdesivir 100 mg in sodium chloride 0.9 % 100 mL IVPB       LOS: 0 days     Albertine Grates, MD PhD FACP [facility]  Available via Epic secure chat 7am-7pm for nonurgent issues Please page for urgent issues To page the attending provider between 7A-7P or the covering provider during after hours 7P-7A, please log into the web site [email or web address] and access using universal [place] password for that web site. If you do not have the password, please call the hospital operator.    01/04/2022, 2:01 PM

## 2022-01-05 DIAGNOSIS — U071 COVID-19: Secondary | ICD-10-CM | POA: Diagnosis not present

## 2022-01-05 LAB — CBC WITH DIFFERENTIAL/PLATELET
Abs Immature Granulocytes: 0.08 10*3/uL — ABNORMAL HIGH (ref 0.00–0.07)
Basophils Absolute: 0 10*3/uL (ref 0.0–0.1)
Basophils Relative: 0 %
Eosinophils Absolute: 0 10*3/uL (ref 0.0–0.5)
Eosinophils Relative: 0 %
HCT: 32 % — ABNORMAL LOW (ref 39.0–52.0)
Hemoglobin: 11.1 g/dL — ABNORMAL LOW (ref 13.0–17.0)
Immature Granulocytes: 1 %
Lymphocytes Relative: 6 %
Lymphs Abs: 0.7 10*3/uL (ref 0.7–4.0)
MCH: 27.5 pg (ref 26.0–34.0)
MCHC: 34.7 g/dL (ref 30.0–36.0)
MCV: 79.2 fL — ABNORMAL LOW (ref 80.0–100.0)
Monocytes Absolute: 0.4 10*3/uL (ref 0.1–1.0)
Monocytes Relative: 3 %
Neutro Abs: 11.4 10*3/uL — ABNORMAL HIGH (ref 1.7–7.7)
Neutrophils Relative %: 90 %
Platelets: 348 10*3/uL (ref 150–400)
RBC: 4.04 MIL/uL — ABNORMAL LOW (ref 4.22–5.81)
RDW: 13.9 % (ref 11.5–15.5)
WBC: 12.5 10*3/uL — ABNORMAL HIGH (ref 4.0–10.5)
nRBC: 0 % (ref 0.0–0.2)

## 2022-01-05 LAB — COMPREHENSIVE METABOLIC PANEL
ALT: 37 U/L (ref 0–44)
AST: 38 U/L (ref 15–41)
Albumin: 2.2 g/dL — ABNORMAL LOW (ref 3.5–5.0)
Alkaline Phosphatase: 86 U/L (ref 38–126)
Anion gap: 11 (ref 5–15)
BUN: 30 mg/dL — ABNORMAL HIGH (ref 8–23)
CO2: 23 mmol/L (ref 22–32)
Calcium: 9.4 mg/dL (ref 8.9–10.3)
Chloride: 105 mmol/L (ref 98–111)
Creatinine, Ser: 1.41 mg/dL — ABNORMAL HIGH (ref 0.61–1.24)
GFR, Estimated: 53 mL/min — ABNORMAL LOW (ref >60)
Glucose, Bld: 118 mg/dL — ABNORMAL HIGH (ref 70–99)
Potassium: 5.3 mmol/L — ABNORMAL HIGH (ref 3.5–5.1)
Sodium: 139 mmol/L (ref 135–145)
Total Bilirubin: 0.4 mg/dL (ref 0.3–1.2)
Total Protein: 7 g/dL (ref 6.5–8.1)

## 2022-01-05 LAB — PROCALCITONIN: Procalcitonin: 0.79 ng/mL

## 2022-01-05 LAB — C-REACTIVE PROTEIN: CRP: 21.8 mg/dL — ABNORMAL HIGH (ref <1.0)

## 2022-01-05 LAB — STREP PNEUMONIAE URINARY ANTIGEN: Strep Pneumo Urinary Antigen: NEGATIVE

## 2022-01-05 MED ORDER — IPRATROPIUM-ALBUTEROL 0.5-2.5 (3) MG/3ML IN SOLN: 3.0000 mL | Freq: Four times a day (QID) | RESPIRATORY_TRACT | Status: DC | PRN

## 2022-01-05 NOTE — Progress Notes (Addendum)
PROGRESS NOTE    Nathaniel Hartman  [id number] DOB: March 27, 1950 DOA: 01/03/2022 PCP: Georganna Skeans, MD     Brief Narrative:    essential hypertension, COPD, not on oxygen supplementation at baseline, who presented to [facility] ED due to persistent productive cough x 5 days.  Associated with generalized weakness and exertional shortness of breath.  Endorses subjective fevers, decrease in appetite and poor oral intake.     Subjective:  He report is feeling better, less cough, appetite is improved, he is on room air at rest, worked with physical therapist, O2 dropped to 87%, denies feeling short of breath, no chest pain, no fever  Wife at bedside   Assessment & Plan:  Principal Problem:   COVID-19 virus infection Active Problems:   COVID-19   Covid 19 pneumonia/acute respiratory failure Georgeann Oppenheim Leeanne Deed -initial cxr showed "1. Minimal infrahilar airspace disease on the right, possible developing infiltrate. 2. Hyperinflation of the lungs with peribronchial cuffing, possible bronchitis or reactive airways disease." -repeat cxr showed "Right middle lobe and possible medial left basilar areas of consolidation or volume loss. " -continue current regimen antiviral and antibacterial , monitor  crp/procalcitonin -o2 dropped to 87% when ambulating , encourage incentive spirometer   COPD Currently no wheezing Already on abx/steroids/nebs    CKDII/ IIIa Cr fluctuating but  appear close to baseline Renal dosing meds     HTN Bp low normal , continue hold home meds lisinopril      I have Reviewed nursing notes, Vitals, pain scores, I/o's, Lab results and  imaging results since pt's last encounter, details please see discussion above  I ordered the following labs:  Unresulted Labs (From admission, onward)     Start     Ordered   01/10/22 0500  Creatinine, serum  (enoxaparin (LOVENOX)    CrCl >/= 30 ml/min)  Weekly,   R     Comments: while on enoxaparin therapy    01/03/22 0622    01/06/22 0500  Basic metabolic panel  Tomorrow morning,   R        01/05/22 0637   01/05/22 1624  Legionella Pneumophila Serogp 1 Ur Ag  Once,   R        01/05/22 1623   01/05/22 1623  Strep pneumoniae urinary antigen  Once,   R        01/05/22 1623   01/05/22 0500  C-reactive protein  Daily at 5am,   R      01/04/22 1348   01/05/22 0500  Procalcitonin  Daily at 5am,   R      01/04/22 1349             DVT prophylaxis: enoxaparin (LOVENOX) injection 40 mg Start: 01/03/22 1600   Code Status:   Code Status: Full Code  Family Communication:  wife at bedside  Disposition:   Dispo: The patient is from: home              Anticipated d/c is to: home              Anticipated d/c date is: 24-48hrs pending clinical improvement, crp/procalcitonin  Antimicrobials:        Objective: Vitals:   01/04/22 1434 01/04/22 2112 01/04/22 2200 01/05/22 1000  BP: 99/69  109/81 91/64  Pulse: (!) 111 69 78 83  Resp: 18 18 20 18   Temp:   97.8 F (36.6 C) 97.6 F (36.4 C)  TempSrc:   Oral Oral  SpO2: 96% 93% 100% 96%  Weight:      Height:        Intake/Output Summary (Last 24 hours) at 01/05/2022 1625 Last data filed at 01/04/2022 1700 Gross per 24 hour  Intake 350 ml  Output --  Net 350 ml   Filed Weights   01/03/22 0024  Weight: 74.4 kg    Examination:  General exam: Pleasant Respiratory system: .diminishes right middle/lower lobe, Respiratory effort normal. Cardiovascular system:  RRR.  Gastrointestinal system: Abdomen is nondistended, soft and nontender.  Normal bowel sounds heard. Central nervous system: Alert and oriented. No focal neurological deficits. Extremities:  no edema Skin: No rashes, lesions or ulcers Psychiatry: Pleasant    Data Reviewed: I have personally reviewed  labs and visualized  imaging studies since the last encounter and formulate the plan        Scheduled Meds:  azithromycin  500 mg Oral Daily   enoxaparin (LOVENOX) injection  40  mg Subcutaneous Q24H   feeding supplement  237 mL Oral BID BM   guaiFENesin  600 mg Oral BID   methylPREDNISolone (SOLU-MEDROL) injection  80 mg Intravenous Daily   mometasone-formoterol  2 puff Inhalation BID   montelukast  10 mg Oral QHS   multivitamin with minerals  1 tablet Oral Daily   nystatin  5 mL Oral QID   Continuous Infusions:     LOS: 1 day     Albertine Grates, MD PhD FACP [facility]  Available via Epic secure chat 7am-7pm for nonurgent issues Please page for urgent issues To page the attending provider between 7A-7P or the covering provider during after hours 7P-7A, please log into the web site [email or web address] and access using universal [place] password for that web site. If you do not have the password, please call the hospital operator.    01/05/2022, 4:25 PM

## 2022-01-05 NOTE — Evaluation (Signed)
Occupational Therapy Evaluation Patient Details Name: Nathaniel Hartman MRN: [id number] DOB: 29-Jun-1950 Today's Date: 01/05/2022   History of Present Illness 71 yo male presents on 01/03/22 with R  pulmonary infiltrates, COVID (+) and possible PNA, thrush on tongue.  PMH: HTN, COPD, CKD   Clinical Impression   Pt at this time was able to complete UB/LB ADLS with just distant supervision due to cues on pacing self. Pt with wife present in the room educated on energy conservation and breathing techniques. Pt and wife did not have any further concerns at this time. Occupational Therapy signing off. Thank you.    Recommendations for follow up therapy are one component of a multi-disciplinary discharge planning process, led by the attending physician.  Recommendations may be updated based on patient status, additional functional criteria and insurance authorization.   Follow Up Recommendations  No OT follow up     Assistance Recommended at Discharge PRN  Patient can return home with the following Assistance with cooking/housework;Assist for transportation    Functional Status Assessment  Patient has had a recent decline in their functional status and demonstrates the ability to make significant improvements in function in a reasonable and predictable amount of time.  Equipment Recommendations  None recommended by OT    Recommendations for Other Services       Precautions / Restrictions Precautions Precaution Comments: watch HR Restrictions Weight Bearing Restrictions: No      Mobility Bed Mobility Overal bed mobility:  (Pt presented sitting at EOB)                  Transfers Overall transfer level: Needs assistance Equipment used: None Transfers: Sit to/from Stand Sit to Stand: Modified independent (Device/Increase time)           General transfer comment: no assist needed for sit>stand      Balance Overall balance assessment: No apparent balance deficits (not  formally assessed)                                         ADL either performed or assessed with clinical judgement   ADL Overall ADL's : Needs assistance/impaired Eating/Feeding: Independent;Sitting   Grooming: Wash/dry hands;Wash/dry face;Supervision/safety;Standing   Upper Body Bathing: Set up;Sitting   Lower Body Bathing: Supervison/ safety;Sit to/from stand   Upper Body Dressing : Set up;Sitting   Lower Body Dressing: Supervision/safety;Sit to/from stand   Toilet Transfer: Supervision/safety   Toileting- Architect and Hygiene: Supervision/safety   Tub/ Engineer, structural: Supervision/safety   Functional mobility during ADLs: Interior and spatial designer      Pertinent Vitals/Pain Pain Assessment Pain Assessment: No/denies pain     Hand Dominance Right   Extremity/Trunk Assessment Upper Extremity Assessment Upper Extremity Assessment: Overall WFL for tasks assessed   Lower Extremity Assessment Lower Extremity Assessment: Defer to PT evaluation   Cervical / Trunk Assessment Cervical / Trunk Assessment: Normal   Communication Communication Communication: Prefers language other than English (wife in room)   Cognition Arousal/Alertness: Awake/alert Behavior During Therapy: WFL for tasks assessed/performed Overall Cognitive Status: Within Functional Limits for tasks assessed  General Comments       Exercises     Shoulder Instructions      Home Living Family/patient expects to be discharged to:: Private residence Living Arrangements: Spouse/significant other Available Help at Discharge: Family;Available 24 hours/day Type of Home: House Home Access: Stairs to enter [employer] of Steps: 5 Entrance Stairs-Rails: Right Home Layout: One level     Bathroom Shower/Tub: Occupational psychologist: Standard      Home Equipment: None   Additional Comments: pt lives with wife      Prior Functioning/Environment Prior Level of Function : Independent/Modified Independent;Driving             Mobility Comments: drives, does not use AD. 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 ADLs Comments: independent        OT Problem List: Decreased activity tolerance      OT Treatment/Interventions:      OT Goals(Current goals can be found in the care plan section) Acute Rehab OT Goals Patient Stated Goal: to go home OT Goal Formulation: With patient Time For Goal Achievement: 01/19/22 Potential to Achieve Goals: Good  OT Frequency:      Co-evaluation              AM-PAC OT "6 Clicks" Daily Activity     Outcome Measure Help from another person eating meals?: None Help from another person taking care of personal grooming?: None Help from another person toileting, which includes using toliet, bedpan, or urinal?: None Help from another person bathing (including washing, rinsing, drying)?: None Help from another person to put on and taking off regular upper body clothing?: None Help from another person to put on and taking off regular lower body clothing?: None 6 Click Score: 24   End of Session    Activity Tolerance: Patient tolerated treatment well Patient left: in chair;with call bell/phone within reach;with family/visitor present  OT Visit Diagnosis: Muscle weakness (generalized) (M62.81)                Time: 1335-1400 OT Time Calculation (min): 25 min Charges:  OT General Charges $OT Visit: 1 Visit OT Evaluation $OT Eval Low Complexity: 1 Low OT Treatments $Self Care/Home Management : 8-22 mins  Joeseph Amor OTR/L  Acute Rehab Services  [phone] office number [phone] pager number   Joeseph Amor 01/05/2022, 3:08 PM

## 2022-01-05 NOTE — Plan of Care (Signed)
  Problem: Education: Goal: Knowledge of General Education information will improve Description: Including pain rating scale, medication(s)/side effects and non-pharmacologic comfort measures Outcome: Not Progressing   Problem: Health Behavior/Discharge Planning: Goal: Ability to manage health-related needs will improve Outcome: Not Progressing   Problem: Coping: Goal: Level of anxiety will decrease Outcome: Not Progressing   Problem: Elimination: Goal: Will not experience complications related to bowel motility Outcome: Not Progressing Goal: Will not experience complications related to urinary retention Outcome: Not Progressing   Problem: Pain Managment: Goal: General experience of comfort will improve Outcome: Not Progressing   Problem: Safety: Goal: Ability to remain free from injury will improve Outcome: Not Progressing

## 2022-01-06 DIAGNOSIS — U071 COVID-19: Secondary | ICD-10-CM | POA: Diagnosis not present

## 2022-01-06 LAB — BASIC METABOLIC PANEL
Anion gap: 11 (ref 5–15)
BUN: 43 mg/dL — ABNORMAL HIGH (ref 8–23)
CO2: 21 mmol/L — ABNORMAL LOW (ref 22–32)
Calcium: 9.1 mg/dL (ref 8.9–10.3)
Chloride: 104 mmol/L (ref 98–111)
Creatinine, Ser: 1.55 mg/dL — ABNORMAL HIGH (ref 0.61–1.24)
GFR, Estimated: 47 mL/min — ABNORMAL LOW (ref >60)
Glucose, Bld: 118 mg/dL — ABNORMAL HIGH (ref 70–99)
Potassium: 4.8 mmol/L (ref 3.5–5.1)
Sodium: 136 mmol/L (ref 135–145)

## 2022-01-06 LAB — C-REACTIVE PROTEIN: CRP: 17.7 mg/dL — ABNORMAL HIGH (ref <1.0)

## 2022-01-06 LAB — PROCALCITONIN: Procalcitonin: 0.48 ng/mL

## 2022-01-06 MED ORDER — PREDNISONE 10 MG PO TABS: ORAL_TABLET | ORAL | 0 refills | Status: AC

## 2022-01-06 MED ORDER — LISINOPRIL 30 MG PO TABS: 30.0000 mg | ORAL_TABLET | Freq: Every day | ORAL | 1 refills | Status: DC

## 2022-01-06 MED ORDER — AZITHROMYCIN 500 MG PO TABS: 500.0000 mg | ORAL_TABLET | Freq: Every day | ORAL | 0 refills | Status: AC

## 2022-01-06 MED ORDER — SODIUM CHLORIDE 0.9 % IV SOLN: INTRAVENOUS | Status: DC

## 2022-01-06 NOTE — Discharge Summary (Signed)
Discharge Summary  Nathaniel Hartman CBU:[id number] DOB: 21-Nov-1950  PCP: Georganna Skeans, MD  Admit date: 01/03/2022 Discharge date: 01/06/2022  Time spent:  Recommendations for Outpatient Follow-up:  F/u with PCP within a week  for hospital discharge follow up, repeat cbc/bmp at follow up, pcp to repeat cxr in 3-4 weeks to ensure resolution of "Right middle lobe and possible medial left basilar areas of consolidation or volume loss. " Patient is advised to check blood pressure at home , bring record for pcp to review F/u with pcp for weight loss    Discharge Diagnoses:  Active Hospital Problems   Diagnosis Date Noted   COVID-19 virus infection 01/03/2022   COVID-19 01/04/2022    Resolved Hospital Problems  No resolved problems to display.    Discharge Condition: stable  Diet recommendation: heart healthy  Filed Weights   01/03/22 0024  Weight: 74.4 kg    History of present illness: ( per admitting provider Dr Margo Aye)  Chief Complaint: Wet cough, generalized weakness, shortness of breath.   HPI: Nathaniel Hartman is a 71 y.o. male with medical history significant for essential hypertension, COPD, not on oxygen supplementation at baseline, who presented to [facility] ED due to persistent productive cough x 5 days.  Associated with generalized weakness and exertional shortness of breath.  Endorses subjective fevers, decrease in appetite and poor oral intake.  No abdominal pain or diarrhea.   In the ED workup revealed acute hypoxic respiratory failure.  COVID-19 screening test positive.  Chest x-ray showing right pulmonary infiltrates.  The patient was started on remdesivir and IV Solu-Medrol.  Due to concern for possible superimposed bacterial pneumonia or infection, the patient was started on IV antibiotics to cover for CAP.  TRH, hospitalist service, was asked to admit.   ED Course: Tmax 98.2.  BP 109/78, pulse 98, respiratory 22, O2 saturation 91% on 4 L.  Lab studies remarkable  for serum sodium 132, bicarb 19, glucose 102, creatinine 1.44 with GFR 51.  Hemoglobin 10.7. Hospital Course:  Principal Problem:   COVID-19 virus infection Active Problems:   COVID-19   Assessment and Plan:  Covid 19 pneumonia/acute respiratory failure Georgeann Oppenheim Leeanne Deed -initial cxr showed "1. Minimal infrahilar airspace disease on the right, possible developing infiltrate. 2. Hyperinflation of the lungs with peribronchial cuffing, possible bronchitis or reactive airways disease." -repeat cxr showed "Right middle lobe and possible medial left basilar areas of consolidation or volume loss. " -treated with  remdesivir,  rocephin/zithro,  steroids, crp/procalcitonin trending down, he is feeling better, hypoxia resolved, he desires to go home, he is to discharge home on two more days of zithromax to finish total of 5 days abx treatment and prednisone to finish prednison taper   COPD no wheezing     CKDII/ IIIa Cr fluctuating but  appear close to baseline Renal dosing meds Repeat bmp at hospital discharge follow up      HTN Bp stable without home lisinopril , resume in three days Patient is advised to check blood pressure at home, bring in record for pcp to review, further bp meds adjustment per pcp.     Weight loss, during discharge, wife reports patient started to loss weight for the last year, f/u with pcp  Discharge Exam: BP 108/88   Pulse 71   Temp 97.7 F (36.5 C)   Resp 18   Ht 6\' 2"  (1.88 m)   Wt 74.4 kg   SpO2 94%   BMI 21.06 kg/m   General: NAD, pleasant  Cardiovascular: RRR Respiratory: normal respiratory effort     Discharge Instructions     Diet - low sodium heart healthy   Complete by: As directed    Increase activity slowly   Complete by: As directed    MyChart COVID-19 home monitoring program   Complete by: Jan 06, 2022    Is the patient willing to use the MyChart Mobile App for home monitoring?: No      Allergies as of 01/06/2022        Reactions   Benadryl [diphenhydramine Hcl (sleep)] Shortness Of Breath   Motrin [ibuprofen] Shortness Of Breath   Nsaids Shortness Of Breath        Medication List     TAKE these medications    albuterol (2.5 MG/3ML) 0.083% nebulizer solution Commonly known as: PROVENTIL inhale 1 vial via nebulizer ever 4hrs as needed for wheezing / shortness of breath   azithromycin 500 MG tablet Commonly known as: ZITHROMAX Take 1 tablet (500 mg total) by mouth daily for 2 days.   budesonide-formoterol 160-4.5 MCG/ACT inhaler Commonly known as: Symbicort Inhale 2 puffs into the lungs 2 (two) times daily.   cetirizine 10 MG tablet Commonly known as: ZYRTEC Take 1 tablet (10 mg total) by mouth daily.   fluticasone 50 MCG/ACT nasal spray Commonly known as: FLONASE Place 2 sprays into both nostrils daily.   lisinopril 30 MG tablet Commonly known as: ZESTRIL Take 1 tablet (30 mg total) by mouth daily. Please hold for three more days, please check your blood pressure at home, bring in record for your pcp to review Start taking on: January 09, 2022 What changed:  additional instructions These instructions start on January 09, 2022. If you are unsure what to do until then, ask your doctor or other care provider.   montelukast 10 MG tablet Commonly known as: Singulair Take 1 tablet (10 mg total) by mouth at bedtime. What changed:  when to take this reasons to take this   predniSONE 10 MG tablet Commonly known as: DELTASONE Label  & dispense according to the schedule below. 4 Pills PO on day one, 3Pills PO on day two, 2 Pills PO on day three, 1 Pills PO on day four,  then STOP.  Total of 10  tabs   triamcinolone cream 0.1 % Commonly known as: KENALOG APPLY TO AFFECTED AREA TWICE A DAY What changed:  how much to take how to take this when to take this       Allergies  Allergen Reactions   Benadryl [Diphenhydramine Hcl (Sleep)] Shortness Of Breath   Motrin [Ibuprofen]  Shortness Of Breath   Nsaids Shortness Of Breath    Follow-up Information     Georganna Skeans, MD Follow up in 1 week(s).   Specialty: Family Medicine Why: hospital discharge follow up, repeat basic lab works including cbc/bmp, monitor your kidney fucntion. pcp to repeat cxr in 3-4 weeks to ensure resolution of pneumonia please check your blood pressure at home, twice a day , bring in blood pressurfe record to your pcp, further blood pressure medication adjustment per pcp follow up with your pcp regarding weight loss Contact information: [address] [place] [address] [phone]                  The results of significant diagnostics from this hospitalization (including imaging, microbiology, ancillary and laboratory) are listed below for reference.    Significant Diagnostic Studies: DG Chest 2 View  Result Date: 01/03/2022 CLINICAL  DATA:  Shortness of breath. EXAM: CHEST - 2 VIEW COMPARISON:  03/24/2021. FINDINGS: The heart is enlarged and the mediastinal contour is within normal limits. There is hyperinflation of the lungs. Mild peribronchial cuffing is noted bilaterally. Mild infrahilar airspace disease is noted on the right. No effusion or pneumothorax. Mild degenerative changes in the thoracic spine. IMPRESSION: 1. Minimal infrahilar airspace disease on the right, possible developing infiltrate. 2. Hyperinflation of the lungs with peribronchial cuffing, possible bronchitis or reactive airways disease. Electronically Signed   By: Thornell SartoriusLaura  Taylor M.D.   On: 01/03/2022 00:52    Microbiology: Recent Results (from the past 240 hour(s))  Resp Panel by RT-PCR (Flu A&B, Covid) Anterior Nasal Swab     Status: Abnormal   Collection Time: 01/03/22  2:54 AM   Specimen: Anterior Nasal Swab  Result Value Ref Range Status   SARS Coronavirus 2 by RT PCR POSITIVE (A) NEGATIVE Final    Comment: (NOTE) SARS-CoV-2 target nucleic acids are DETECTED.  The SARS-CoV-2 RNA  is generally detectable in upper respiratory specimens during the acute phase of infection. Positive results are indicative of the presence of the identified virus, but do not rule out bacterial infection or co-infection with other pathogens not detected by the test. Clinical correlation with patient history and other diagnostic information is necessary to determine patient infection status. The expected result is Negative.  Fact Sheet for Patients: BloggerCourse.comhttps://www.fda.gov/media/152166/download  Fact Sheet for Healthcare Providers: SeriousBroker.ithttps://www.fda.gov/media/152162/download  This test is not yet approved or cleared by the [place] FDA and  has been authorized for detection and/or diagnosis of SARS-CoV-2 by FDA under an Emergency Use Authorization (EUA).  This EUA will remain in effect (meaning this test can be used) for the duration of  the COVID-19 declaration under Section 564(b)(1) of the A ct, 21 U.S.C. section 360bbb-3(b)(1), unless the authorization is terminated or revoked sooner.     Influenza A by PCR NEGATIVE NEGATIVE Final   Influenza B by PCR NEGATIVE NEGATIVE Final    Comment: (NOTE) The Xpert Xpress SARS-CoV-2/FLU/RSV plus assay is intended as an aid in the diagnosis of influenza from Nasopharyngeal swab specimens and should not be used as a sole basis for treatment. Nasal washings and aspirates are unacceptable for Xpert Xpress SARS-CoV-2/FLU/RSV testing.  Fact Sheet for Patients: BloggerCourse.comhttps://www.fda.gov/media/152166/download  Fact Sheet for Healthcare Providers: SeriousBroker.ithttps://www.fda.gov/media/152162/download  This test is not yet approved or cleared by the [place] FDA and has been authorized for detection and/or diagnosis of SARS-CoV-2 by FDA under an Emergency Use Authorization (EUA). This EUA will remain in effect (meaning this test can be used) for the duration of the COVID-19 declaration under Section 564(b)(1) of the Act, 21 U.S.C. section 360bbb-3(b)(1),  unless the authorization is terminated or revoked.  Performed at [facility], [address] [place], [place] [address]      Labs: Basic Metabolic Panel: Recent Labs  Lab 01/03/22 0035 01/03/22 0735 01/04/22 0654 01/05/22 0250 01/06/22 0312  NA 132*  --  135 139 136  K 4.4  --  4.7 5.3* 4.8  CL 103  --  104 105 104  CO2 19*  --  22 23 21*  GLUCOSE 102*  --  103* 118* 118*  BUN 21  --  24* 30* 43*  CREATININE 1.44* 1.14 1.29* 1.41* 1.55*  CALCIUM 8.5*  --  8.6* 9.4 9.1  MG  --   --  2.1  --   --   PHOS  --   --  3.6  --   --    Liver Function Tests: Recent Labs  Lab 01/03/22 0035 01/05/22 0250  AST 32 38  ALT 27 37  ALKPHOS 88 86  BILITOT 0.6 0.4  PROT 6.8 7.0  ALBUMIN 2.3* 2.2*   Recent Labs  Lab 01/03/22 0035  LIPASE 28   No results for input(s): "AMMONIA" in the last 168 hours. CBC: Recent Labs  Lab 01/03/22 0035 01/03/22 0735 01/04/22 0654 01/05/22 0250  WBC 8.2 8.0 12.0* 12.5*  NEUTROABS  --   --  10.0* 11.4*  HGB 10.7* 9.5* 10.8* 11.1*  HCT 33.2* 27.5* 31.3* 32.0*  MCV 83.8 80.6 79.6* 79.2*  PLT 242 225 288 348   Cardiac Enzymes: No results for input(s): "CKTOTAL", "CKMB", "CKMBINDEX", "TROPONINI" in the last 168 hours. BNP: BNP (last 3 results) No results for input(s): "BNP" in the last 8760 hours.  ProBNP (last 3 results) No results for input(s): "PROBNP" in the last 8760 hours.  CBG: No results for input(s): "GLUCAP" in the last 168 hours.  FURTHER DISCHARGE INSTRUCTIONS:   Get Medicines reviewed and adjusted: Please take all your medications with you for your next visit with your Primary MD   Laboratory/radiological data: Please request your Primary MD to go over all hospital tests and procedure/radiological results at the follow up, please ask your Primary MD to get all Hospital records sent to his/her office.   In some cases, they will be blood work, cultures and biopsy results pending at the time of your discharge.  Please request that your primary care M.D. goes through all the records of your hospital data and follows up on these results.   Also Note the following: If you experience worsening of your admission symptoms, develop shortness of breath, life threatening emergency, suicidal or homicidal thoughts you must seek medical attention immediately by calling 911 or calling your MD immediately  if symptoms less severe.   You must read complete instructions/literature along with all the possible adverse reactions/side effects for all the Medicines you take and that have been prescribed to you. Take any new Medicines after you have completely understood and accpet all the possible adverse reactions/side effects.    Do not drive when taking Pain medications or sleeping medications (Benzodaizepines)   Do not take more than prescribed Pain, Sleep and Anxiety Medications. It is not advisable to combine anxiety,sleep and pain medications without talking with your primary care practitioner   Special Instructions: If you have smoked or chewed Tobacco  in the last 2 yrs please stop smoking, stop any regular Alcohol  and or any Recreational drug use.   Wear Seat belts while driving.   Please note: You were cared for by a hospitalist during your hospital stay. Once you are discharged, your primary care physician will handle any further medical issues. Please note that NO REFILLS for any discharge medications will be authorized once you are discharged, as it is imperative that you return to your primary care physician (or establish a relationship with a primary care physician if you do not have one) for your post hospital discharge needs so that they can reassess your need for medications and monitor your lab values.     Signed:  Albertine Grates MD, PhD, FACP  [facility] 01/06/2022, 8:25 PM

## 2022-01-06 NOTE — Progress Notes (Signed)
  Transition of Care [facility]) Screening Note   Patient Details  Name: Nathaniel Hartman Date of Birth: 10-12-1950   Transition of Care [facility]) CM/SW Contact:    Bess Kinds, RN Phone Number: [phone] 01/06/2022, 10:41 AM  Chart reviewed. Patient to transition home today. No TOC needs identified.   Transition of Care Department [facility]) has reviewed patient and no TOC needs have been identified at this time. We will continue to monitor patient advancement through interdisciplinary progression rounds. If new patient transition needs arise, please place a TOC consult.

## 2022-01-06 NOTE — Progress Notes (Deleted)
Patient was given discharge instructions and stated understanding. 

## 2022-01-07 ENCOUNTER — Telehealth: Payer: Self-pay

## 2022-01-07 LAB — LEGIONELLA PNEUMOPHILA SEROGP 1 UR AG: L. pneumophila Serogp 1 Ur Ag: NEGATIVE

## 2022-01-07 NOTE — Patient Outreach (Signed)
  Care Coordination [facility] Note Transition Care Management Follow-up Telephone Call Date of discharge and from where: [facility] 01/03/22-01/06/22 How have you been since you were released from the hospital? Used [facility] interpreter, Theron Arista #[id number] and called patient number.  2 attempts were made and on the third attempt the call was answered, a male said she was working and patient was at home.  There is no other number to call and she hung up the phone with the interpreter. Any questions or concerns? No  Items Reviewed: Did the pt receive and understand the discharge instructions provided?  Unable to ask patient Medications obtained and verified?  Unable to verify Other? No  Any new allergies since your discharge?  Unable to obtain Dietary orders reviewed? No Do you have support at home? Yes   Home Care and Equipment/Supplies: Were home health services ordered? no If so, what is the name of the agency? N/A  Has the agency set up a time to come to the patient's home? not applicable Were any new equipment or medical supplies ordered?  No What is the name of the medical supply agency? N/a Were you able to get the supplies/equipment? not applicable Do you have any questions related to the use of the equipment or supplies? No  Functional Questionnaire: (I = Independent and D = Dependent) ADLs: Unable to obtain information  Bathing/Dressing- Unable to obtain information  Meal Prep- Unable to obtain information  Eating- I  Maintaining continence- Unable to obtain information  Transferring/Ambulation- Unable to obtain information  Managing Meds- Unable to obtain information  Follow up appointments reviewed:  PCP Hospital f/u appt confirmed? No  Specialist Hospital f/u appt confirmed? No   Are transportation arrangements needed?  Unable to obtain information If their condition worsens, is the pt aware to call PCP or go to the Emergency Dept.? Yes Was the patient provided with  contact information for the PCP's office or ED? Yes Was to pt encouraged to call back with questions or concerns? Yes  SDOH assessments and interventions completed:   Yes  Care Coordination Interventions Activated:  Yes   Care Coordination Interventions:  PCP follow up appointment requested   Encounter Outcome:  Pt. Refused

## 2022-01-16 ENCOUNTER — Encounter: Payer: Self-pay | Admitting: Family Medicine

## 2022-01-16 ENCOUNTER — Ambulatory Visit (INDEPENDENT_AMBULATORY_CARE_PROVIDER_SITE_OTHER): Payer: Medicare PPO | Admitting: Family Medicine

## 2022-01-16 VITALS — BP 124/84 | HR 77 | Temp 98.1°F | Resp 16 | Wt 156.8 lb

## 2022-01-16 DIAGNOSIS — Z789 Other specified health status: Secondary | ICD-10-CM | POA: Diagnosis not present

## 2022-01-16 DIAGNOSIS — I10 Essential (primary) hypertension: Secondary | ICD-10-CM

## 2022-01-16 DIAGNOSIS — N189 Chronic kidney disease, unspecified: Secondary | ICD-10-CM

## 2022-01-16 DIAGNOSIS — Z09 Encounter for follow-up examination after completed treatment for conditions other than malignant neoplasm: Secondary | ICD-10-CM | POA: Diagnosis not present

## 2022-01-16 NOTE — Progress Notes (Unsigned)
Due to language barrier, an interpreter was used.  Interpreter name or [employer] Interpreter ID #--[phone], Reason for encounter--OV.  ~Idella Lamontagne E, RMA    Patient is here for their 6 month follow-up HFU Care gaps have been discussed with patient

## 2022-01-17 ENCOUNTER — Encounter: Payer: Self-pay | Admitting: Family Medicine

## 2022-01-17 LAB — BASIC METABOLIC PANEL
BUN/Creatinine Ratio: 22 (ref 10–24)
BUN: 28 mg/dL — ABNORMAL HIGH (ref 8–27)
CO2: 21 mmol/L (ref 20–29)
Calcium: 9 mg/dL (ref 8.6–10.2)
Chloride: 103 mmol/L (ref 96–106)
Creatinine, Ser: 1.26 mg/dL (ref 0.76–1.27)
Glucose: 87 mg/dL (ref 70–99)
Potassium: 5.6 mmol/L — ABNORMAL HIGH (ref 3.5–5.2)
Sodium: 139 mmol/L (ref 134–144)
eGFR: 60 mL/min/{1.73_m2} (ref >59)

## 2022-01-17 NOTE — Progress Notes (Signed)
Established Patient Office Visit  Subjective    Patient ID: Nathaniel Hartman, male    DOB: 1950/08/20  Age: 71 y.o. MRN: [id number]  CC:  Chief Complaint  Patient presents with   Follow-up    6 month    HPI Nathaniel Hartman presents for routine hospital discharge follow up where he had admitted for covid. He reports some persistent cough but is otherwise feeling much better. This visit was aided by interpreter.    Outpatient Encounter Medications as of 01/16/2022  Medication Sig   budesonide-formoterol (SYMBICORT) 160-4.5 MCG/ACT inhaler Inhale 2 puffs into the lungs 2 (two) times daily.   cetirizine (ZYRTEC) 10 MG tablet Take 1 tablet (10 mg total) by mouth daily.   lisinopril (ZESTRIL) 30 MG tablet Take 1 tablet (30 mg total) by mouth daily. Please hold for three more days, please check your blood pressure at home, bring in record for your pcp to review   montelukast (SINGULAIR) 10 MG tablet Take 1 tablet (10 mg total) by mouth at bedtime. (Patient taking differently: Take 10 mg by mouth daily as needed (for allergies).)   predniSONE (DELTASONE) 10 MG tablet Label  & dispense according to the schedule below. 4 Pills PO on day one, 3Pills PO on day two, 2 Pills PO on day three, 1 Pills PO on day four,  then STOP.  Total of 10  tabs   triamcinolone cream (KENALOG) 0.1 % APPLY TO AFFECTED AREA TWICE A DAY (Patient taking differently: Apply 1 Application topically 2 (two) times daily. APPLY TO AFFECTED AREA TWICE A DAY)   albuterol (PROVENTIL) (2.5 MG/3ML) 0.083% nebulizer solution inhale 1 vial via nebulizer ever 4hrs as needed for wheezing / shortness of breath (Patient not taking: Reported on 01/03/2022)   fluticasone (FLONASE) 50 MCG/ACT nasal spray Place 2 sprays into both nostrils daily. (Patient not taking: Reported on 01/03/2022)   No facility-administered encounter medications on file as of 01/16/2022.    Past Medical History:  Diagnosis Date   Asthma    Bilateral pneumonia  07/31/2007   Chronic cough    Chronic kidney disease    COPD (chronic obstructive pulmonary disease) (HCC)    GERD (gastroesophageal reflux disease)    pt denies this (05/31/14)   Hypertension    Pansinusitis    Shortness of breath     Past Surgical History:  Procedure Laterality Date   EYE SURGERY Right 2008   cataract surgery   left hand surgery  08/2006   NASAL SINUS SURGERY Bilateral 06/02/2014   Procedure: BILATERAL ENDOSCOPIC SINUS SURGERY WITH FUSION ;  Surgeon: Jerrell Belfast, MD;  Location: [facility];  Service: ENT;  Laterality: Bilateral;   POLYPECTOMY N/A 06/02/2014   Procedure: POLYPECTOMY NASAL;  Surgeon: Jerrell Belfast, MD;  Location: [place];  Service: ENT;  Laterality: N/A;    No family history on file.  Social History   Socioeconomic History   Marital status: Single    Spouse name: Not on file   Number of children: Not on file   Years of education: Not on file   Highest education level: Not on file  Occupational History   Occupation: metal work  Tobacco Use   Smoking status: Former    Packs/day: 0.50    Years: 4.00    Total pack years: 2.00    Types: Cigarettes    Quit date: 02/11/1997    Years since quitting: 24.9   Smokeless tobacco: Never  Substance and Sexual Activity   Alcohol use:  No   Drug use: No   Sexual activity: Not on file  Other Topics Concern   Not on file  Social History Narrative   Not on file   Social Determinants of Health   Financial Resource Strain: Not on file  Food Insecurity: No Food Insecurity (01/03/2022)   Hunger Vital Sign    Worried About Running Out of Food in the Last Year: Never true    Ran Out of Food in the Last Year: Never true  Transportation Needs: No Transportation Needs (01/07/2022)   PRAPARE - Administrator, Civil Service (Medical): No    Lack of Transportation (Non-Medical): No  Physical Activity: Not on file  Stress: Not on file  Social Connections: Not on file  Intimate Partner Violence: Not  At Risk (01/03/2022)   Humiliation, Afraid, Rape, and Kick questionnaire    Fear of Current or Ex-Partner: No    Emotionally Abused: No    Physically Abused: No    Sexually Abused: No    Review of Systems  Constitutional:  Negative for chills and fever.  Respiratory:  Positive for cough. Negative for shortness of breath.   All other systems reviewed and are negative.       Objective    BP 124/84   Pulse 77   Temp 98.1 F (36.7 C) (Oral)   Resp 16   Wt 156 lb 12.8 oz (71.1 kg)   SpO2 98%   BMI 20.13 kg/m   Physical Exam Vitals and nursing note reviewed.  Constitutional:      General: He is not in acute distress. Cardiovascular:     Rate and Rhythm: Normal rate and regular rhythm.  Pulmonary:     Effort: Pulmonary effort is normal.     Breath sounds: Normal breath sounds.  Abdominal:     Palpations: Abdomen is soft.     Tenderness: There is no abdominal tenderness.  Musculoskeletal:     Right lower leg: No edema.     Left lower leg: No edema.  Neurological:     General: No focal deficit present.     Mental Status: He is alert and oriented to person, place, and time.         Assessment & Plan:   1. Essential hypertension Appears stable. Continue. Monitoring labs ordered - Basic Metabolic Panel  2. Chronic kidney disease, unspecified CKD stage As above - Basic Metabolic Panel  3. Hospital discharge follow-up Improving. Return for follow up CXR as recommended.  4. Language barrier to communication     Return in about 5 weeks (around 02/20/2022) for follow up.   [name], MD

## 2022-02-06 ENCOUNTER — Other Ambulatory Visit: Payer: Self-pay

## 2022-02-25 ENCOUNTER — Other Ambulatory Visit: Payer: Self-pay

## 2022-02-25 ENCOUNTER — Encounter: Payer: Self-pay | Admitting: Family Medicine

## 2022-02-25 ENCOUNTER — Ambulatory Visit (INDEPENDENT_AMBULATORY_CARE_PROVIDER_SITE_OTHER): Payer: Medicare PPO | Admitting: Family Medicine

## 2022-02-25 ENCOUNTER — Telehealth: Payer: Self-pay

## 2022-02-25 VITALS — BP 132/87 | HR 76 | Temp 98.1°F | Resp 16 | Wt 168.2 lb

## 2022-02-25 DIAGNOSIS — J4540 Moderate persistent asthma, uncomplicated: Secondary | ICD-10-CM | POA: Diagnosis not present

## 2022-02-25 DIAGNOSIS — J309 Allergic rhinitis, unspecified: Secondary | ICD-10-CM | POA: Diagnosis not present

## 2022-02-25 DIAGNOSIS — Z789 Other specified health status: Secondary | ICD-10-CM | POA: Diagnosis not present

## 2022-02-25 DIAGNOSIS — I10 Essential (primary) hypertension: Secondary | ICD-10-CM | POA: Diagnosis not present

## 2022-02-25 MED ORDER — BUDESONIDE-FORMOTEROL FUMARATE 160-4.5 MCG/ACT IN AERO
2.0000 | INHALATION_SPRAY | Freq: Two times a day (BID) | RESPIRATORY_TRACT | 12 refills | Status: DC
  Filled 2022-02-25: qty 10.2, 30d supply, fill #0

## 2022-02-25 MED ORDER — CETIRIZINE HCL 10 MG PO TABS
10.0000 mg | ORAL_TABLET | Freq: Every day | ORAL | 3 refills | Status: DC
  Filled 2022-02-25: qty 100, 100d supply, fill #0

## 2022-02-25 MED ORDER — LISINOPRIL 40 MG PO TABS
40.0000 mg | ORAL_TABLET | Freq: Every day | ORAL | 1 refills | Status: DC
  Filled 2022-02-25: qty 90, 90d supply, fill #0

## 2022-02-25 NOTE — Progress Notes (Signed)
Patient is here for their 6 month follow-up Patient has no concerns today Care gaps have been discussed with patient   Due to language barrier, an interpreter was used.  Interpreter name or [place] [facility] #--Mina [phone], Reason for encounter--OV.  Rodney Cruise, RMA

## 2022-02-25 NOTE — Telephone Encounter (Signed)
Spoke to pt spouse using PI Leo# [id number] to advise pt that Dr. Redmond Pulling stated that pt is welcomed to come to appt early today. Pt spouse stated they will be here

## 2022-02-27 ENCOUNTER — Encounter: Payer: Self-pay | Admitting: Family Medicine

## 2022-02-27 NOTE — Progress Notes (Signed)
Established Patient Office Visit  Subjective    Patient ID: Nathaniel Hartman, male    DOB: 1951/01/15  Age: 72 y.o. MRN: [id number]  CC:  Chief Complaint  Patient presents with   Follow-up   Hypertension    HPI Nathaniel Hartman presents for routine follow up of chronic med issues. Patient denies acute complaints. This visit was aided by an interpreter.    Outpatient Encounter Medications as of 02/25/2022  Medication Sig   lisinopril (ZESTRIL) 40 MG tablet Take 1 tablet (40 mg total) by mouth daily.   albuterol (PROVENTIL) (2.5 MG/3ML) 0.083% nebulizer solution inhale 1 vial via nebulizer ever 4hrs as needed for wheezing / shortness of breath (Patient not taking: Reported on 01/03/2022)   budesonide-formoterol (SYMBICORT) 160-4.5 MCG/ACT inhaler Inhale 2 puffs into the lungs 2 (two) times daily.   cetirizine (ZYRTEC) 10 MG tablet Take 1 tablet (10 mg total) by mouth daily.   fluticasone (FLONASE) 50 MCG/ACT nasal spray Place 2 sprays into both nostrils daily. (Patient not taking: Reported on 01/03/2022)   montelukast (SINGULAIR) 10 MG tablet Take 1 tablet (10 mg total) by mouth at bedtime. (Patient taking differently: Take 10 mg by mouth daily as needed (for allergies).)   predniSONE (DELTASONE) 10 MG tablet Label  & dispense according to the schedule below. 4 Pills PO on day one, 3Pills PO on day two, 2 Pills PO on day three, 1 Pills PO on day four,  then STOP.  Total of 10  tabs   triamcinolone cream (KENALOG) 0.1 % APPLY TO AFFECTED AREA TWICE A DAY (Patient taking differently: Apply 1 Application topically 2 (two) times daily. APPLY TO AFFECTED AREA TWICE A DAY)   [DISCONTINUED] budesonide-formoterol (SYMBICORT) 160-4.5 MCG/ACT inhaler Inhale 2 puffs into the lungs 2 (two) times daily.   [DISCONTINUED] cetirizine (ZYRTEC) 10 MG tablet Take 1 tablet (10 mg total) by mouth daily.   [DISCONTINUED] lisinopril (ZESTRIL) 30 MG tablet Take 1 tablet (30 mg total) by mouth daily. Please hold for  three more days, please check your blood pressure at home, bring in record for your pcp to review   No facility-administered encounter medications on file as of 02/25/2022.    Past Medical History:  Diagnosis Date   Asthma    Bilateral pneumonia 07/31/2007   Chronic cough    Chronic kidney disease    COPD (chronic obstructive pulmonary disease) (HCC)    GERD (gastroesophageal reflux disease)    pt denies this (05/31/14)   Hypertension    Pansinusitis    Shortness of breath     Past Surgical History:  Procedure Laterality Date   EYE SURGERY Right 2008   cataract surgery   left hand surgery  08/2006   NASAL SINUS SURGERY Bilateral 06/02/2014   Procedure: BILATERAL ENDOSCOPIC SINUS SURGERY WITH FUSION ;  Surgeon: Jerrell Belfast, MD;  Location: [facility];  Service: ENT;  Laterality: Bilateral;   POLYPECTOMY N/A 06/02/2014   Procedure: POLYPECTOMY NASAL;  Surgeon: Jerrell Belfast, MD;  Location: [facility];  Service: ENT;  Laterality: N/A;    No family history on file.  Social History   Socioeconomic History   Marital status: Single    Spouse name: Not on file   Number of children: Not on file   Years of education: Not on file   Highest education level: Not on file  Occupational History   Occupation: metal work  Tobacco Use   Smoking status: Former    Packs/day: 0.50    Years:  4.00    Total pack years: 2.00    Types: Cigarettes    Quit date: 02/11/1997    Years since quitting: 25.0   Smokeless tobacco: Never  Substance and Sexual Activity   Alcohol use: No   Drug use: No   Sexual activity: Not on file  Other Topics Concern   Not on file  Social History Narrative   Not on file   Social Determinants of Health   Financial Resource Strain: Not on file  Food Insecurity: No Food Insecurity (01/03/2022)   Hunger Vital Sign    Worried About Running Out of Food in the Last Year: Never true    Ran Out of Food in the Last Year: Never true  Transportation Needs: No Transportation  Needs (01/07/2022)   PRAPARE - Hydrologist (Medical): No    Lack of Transportation (Non-Medical): No  Physical Activity: Not on file  Stress: Not on file  Social Connections: Not on file  Intimate Partner Violence: Not At Risk (01/03/2022)   Humiliation, Afraid, Rape, and Kick questionnaire    Fear of Current or Ex-Partner: No    Emotionally Abused: No    Physically Abused: No    Sexually Abused: No    Review of Systems  All other systems reviewed and are negative.       Objective    BP 132/87   Pulse 76   Temp 98.1 F (36.7 C) (Oral)   Resp 16   Wt 168 lb 3.2 oz (76.3 kg)   BMI 21.60 kg/m   Physical Exam Vitals and nursing note reviewed.  Constitutional:      General: He is not in acute distress. Cardiovascular:     Rate and Rhythm: Normal rate and regular rhythm.  Pulmonary:     Effort: Pulmonary effort is normal.     Breath sounds: Normal breath sounds.  Abdominal:     Palpations: Abdomen is soft.     Tenderness: There is no abdominal tenderness.  Musculoskeletal:     Right lower leg: No edema.     Left lower leg: No edema.  Neurological:     General: No focal deficit present.     Mental Status: He is alert and oriented to person, place, and time.         Assessment & Plan:   1. Essential hypertension Appears stable. continue  2. Moderate persistent asthma without complication Appears stable. Continue. Meds refilled.  - budesonide-formoterol (SYMBICORT) 160-4.5 MCG/ACT inhaler; Inhale 2 puffs into the lungs 2 (two) times daily.  Dispense: 10.2 g; Refill: 12  3. Allergic rhinitis, unspecified seasonality, unspecified trigger Appears stable.  - cetirizine (ZYRTEC) 10 MG tablet; Take 1 tablet (10 mg total) by mouth daily.  Dispense: 100 tablet; Refill: 3  4. Language barrier to communication     Return in about 6 months (around 08/26/2022) for follow up.   Becky Sax, MD

## 2022-02-28 ENCOUNTER — Other Ambulatory Visit: Payer: Self-pay

## 2022-03-04 ENCOUNTER — Ambulatory Visit: Payer: Medicare PPO | Admitting: Family Medicine

## 2022-03-07 ENCOUNTER — Other Ambulatory Visit: Payer: Self-pay

## 2022-03-08 ENCOUNTER — Other Ambulatory Visit: Payer: Self-pay | Admitting: Family Medicine

## 2022-03-08 DIAGNOSIS — J4540 Moderate persistent asthma, uncomplicated: Secondary | ICD-10-CM

## 2022-03-08 MED ORDER — BUDESONIDE-FORMOTEROL FUMARATE 160-4.5 MCG/ACT IN AERO: 2.0000 | INHALATION_SPRAY | Freq: Two times a day (BID) | RESPIRATORY_TRACT | 11 refills | Status: DC

## 2022-03-08 NOTE — Telephone Encounter (Signed)
Copied from [place] [id number]. Topic: General - Other >> Mar 08, 2022 11:39 AM Everette C wrote: Reason for CRM: Medication Refill - Medication:  budesonide-formoterol (SYMBICORT) 160-4.5 MCG/ACT inhaler [[id number]] Rx #: [id number] budesonide-formoterol (SYMBICORT) 160-4.5 MCG/ACT inhaler [[id number]]  The patient's son has called to request that the prescription be for brand name Symbicort for insurance coverage    Has the patient contacted their pharmacy? Yes.   (Agent: If no, request that the patient contact the pharmacy for the refill. If patient does not wish to contact the pharmacy document the reason why and proceed with request.) (Agent: If yes, when and what did the pharmacy advise?)  Preferred Pharmacy (with phone number or street name): [id number] [address], [place]. [place] [address] Phone: [phone] Fax: [phone] Hours: Not open 24 hours   Has the patient been seen for an appointment in the last year OR does the patient have an upcoming appointment? Yes.    Agent: Please be advised that RX refills may take up to 3 business days. We ask that you follow-up with your pharmacy.

## 2022-03-08 NOTE — Telephone Encounter (Signed)
Change of pharmacy Requested Prescriptions  Pending Prescriptions Disp Refills   budesonide-formoterol (SYMBICORT) 160-4.5 MCG/ACT inhaler 10.2 g 11    Sig: Inhale 2 puffs into the lungs 2 (two) times daily.     Pulmonology:  Combination Products Passed - 03/08/2022  1:27 PM      Passed - Valid encounter within last 12 months    Recent Outpatient Visits           1 week ago Essential hypertension   [facility], MD   1 month ago Essential hypertension   [facility] [facility], MD   6 months ago Essential hypertension   [facility] [facility], MD   9 months ago Dermatitis   [facility], MD   11 months ago Community acquired pneumonia, unspecified laterality   [place] [facility], MD       Future Appointments             In 5 months Dorna Mai, MD [facility]

## 2022-03-11 ENCOUNTER — Telehealth: Payer: Self-pay | Admitting: Family Medicine

## 2022-03-11 NOTE — Telephone Encounter (Signed)
Left message for patient to call back and schedule Medicare Annual Wellness Visit (AWV) either virtually or in office. Left  my Herbie Drape number [phone]   *due 10/13/14 awvi per [facility] please schedule with Nurse Health Adviser   45 min for awv-i  in office appointments 30 min for awv-s & awv-i phone/virtual appointments

## 2022-03-12 ENCOUNTER — Ambulatory Visit: Payer: Medicare PPO

## 2022-03-12 ENCOUNTER — Telehealth: Payer: Self-pay

## 2022-03-12 NOTE — Telephone Encounter (Signed)
Interpreter contacted patient on preferred number listed in notes for scheduled AWV. Wife stated patient was not home.unable to complete visit. Message left with wife okay to reschedule.

## 2022-03-14 ENCOUNTER — Telehealth: Payer: Self-pay | Admitting: Family Medicine

## 2022-03-14 NOTE — Telephone Encounter (Signed)
Copied from [place] [id number]. Topic: Appointment Scheduling - Scheduling Inquiry for Clinic >> Mar 13, 2022 11:13 AM Nathaniel Hartman wrote: Reason for CRM: Patients wife called to reschedule AWV. Please call back

## 2022-03-18 NOTE — Telephone Encounter (Signed)
In will call patient for AWS

## 2022-03-20 ENCOUNTER — Telehealth: Payer: Self-pay | Admitting: *Deleted

## 2022-03-20 NOTE — Telephone Encounter (Signed)
Due to language barrier, an interpreter was used.  Interpreter name or [place] ID #--Nancy [phone], Reason for encounter--AVW appt.  Rodney Cruise, RMA     Call place to patient to set up appointment time for AWV question. LVM to call back

## 2022-03-29 ENCOUNTER — Telehealth: Payer: Self-pay | Admitting: *Deleted

## 2022-03-29 NOTE — Telephone Encounter (Signed)
Due to language barrier, an interpreter was used.  Interpreter name or [place] #--Tien [phone], Reason for encounter--APPT.  ~Kavaughn Faucett E, RMA     Patient was ca;;ed today tp set up [place] appt via phone call. LVM to RT

## 2022-04-16 ENCOUNTER — Other Ambulatory Visit: Payer: Self-pay | Admitting: Family Medicine

## 2022-04-16 NOTE — Telephone Encounter (Signed)
Requested medications are due for refill today.  no  Requested medications are on the active medications list.  yes  Last refill. 02/25/2022 #901 rf  Future visit scheduled.   yes  Notes to clinic.  The [place] starting [place]17 and needs enough of this medication to last until September. Please assist patient further     Requested Prescriptions  Pending Prescriptions Disp Refills   lisinopril (ZESTRIL) 40 MG tablet 90 tablet 1    Sig: Take 1 tablet (40 mg total) by mouth daily.     Cardiovascular:  ACE Inhibitors Failed - 04/16/2022  4:12 PM      Failed - K in normal range and within 180 days    Potassium  Date Value Ref Range Status  01/16/2022 5.6 (H) 3.5 - 5.2 mmol/L Final         Passed - Cr in normal range and within 180 days    Creat  Date Value Ref Range Status  01/14/2015 1.71 (H) 0.70 - 1.25 mg/dL Final   Creatinine, Ser  Date Value Ref Range Status  01/16/2022 1.26 0.76 - 1.27 mg/dL Final         Passed - Patient is not pregnant      Passed - Last BP in normal range    BP Readings from Last 1 Encounters:  02/25/22 132/87         Passed - Valid encounter within last 6 months    Recent Outpatient Visits           1 month ago Essential hypertension   [facility] [facility], MD   3 months ago Essential hypertension   [place] [facility], MD   7 months ago Essential hypertension   [place] [facility], MD   10 months ago Dermatitis   [place] [facility], MD   1 year ago Community acquired pneumonia, unspecified laterality   [place] [facility], MD       Future Appointments             In 6 months Dorna Mai, MD [facility] at [facility]

## 2022-04-16 NOTE — Telephone Encounter (Signed)
Medication Refill - Medication: lisinopril (ZESTRIL) 40 MG tablet   Has the patient contacted their pharmacy? Yes.     Preferred Pharmacy (with phone number or street name):  [id number] [place] [place] Phone: [phone] Fax: [phone]    Has the patient been seen for an appointment in the last year OR does the patient have an upcoming appointment? Yes.    The patient is going out of the country for 6 months starting 03/17 and needs enough of this medication to last until September. Please assist patient further

## 2022-04-19 MED ORDER — LISINOPRIL 40 MG PO TABS: 40.0000 mg | ORAL_TABLET | Freq: Every day | ORAL | 1 refills | Status: DC

## 2022-05-22 ENCOUNTER — Other Ambulatory Visit: Payer: Self-pay | Admitting: Family Medicine

## 2022-06-20 ENCOUNTER — Telehealth: Payer: Self-pay | Admitting: Family Medicine

## 2022-06-20 NOTE — Telephone Encounter (Signed)
Called patient to schedule Medicare Annual Wellness Visit (AWV). Left message for patient to call back and schedule Medicare Annual Wellness Visit (AWV).  Last date of AWV: due 10/13/14 awvi per [facility]  Please transfer to Shakeisha Horine @ [phone] to schedule AWV  Thank you ,  Rudell Cobb AWV direct phone # [phone]

## 2022-08-26 ENCOUNTER — Encounter: Payer: Medicare PPO | Admitting: Family Medicine

## 2022-10-09 ENCOUNTER — Other Ambulatory Visit: Payer: Self-pay | Admitting: Pharmacist

## 2022-10-09 MED ORDER — LISINOPRIL 40 MG PO TABS: 40.0000 mg | ORAL_TABLET | Freq: Every day | ORAL | 1 refills | Status: DC

## 2022-10-09 NOTE — Progress Notes (Signed)
Patient ID: Nathaniel Hartman, male   DOB: 1950-03-05, 72 y.o.   MRN: [id number]  Pharmacy Quality Measure Review  This patient is appearing on a report for being at risk of failing the adherence measure for hypertension (ACEi/ARB) medications this calendar year.   Medication: lisinopril Last fill date: 02/25/2022 for 90 day supply  Rxn sent for 90-ds to patient's pharmacy.  Butch Penny, PharmD, Patsy [place], CPP Clinical Pharmacist [facility] [phone]

## 2022-11-04 ENCOUNTER — Ambulatory Visit (INDEPENDENT_AMBULATORY_CARE_PROVIDER_SITE_OTHER): Payer: Medicare PPO | Admitting: Family Medicine

## 2022-11-04 ENCOUNTER — Encounter: Payer: Self-pay | Admitting: Family Medicine

## 2022-11-04 VITALS — BP 138/87 | HR 74 | Temp 98.0°F | Resp 16 | Wt 180.0 lb

## 2022-11-04 DIAGNOSIS — Z0000 Encounter for general adult medical examination without abnormal findings: Secondary | ICD-10-CM | POA: Diagnosis not present

## 2022-11-04 DIAGNOSIS — Z23 Encounter for immunization: Secondary | ICD-10-CM | POA: Diagnosis not present

## 2022-11-04 DIAGNOSIS — Z13220 Encounter for screening for lipoid disorders: Secondary | ICD-10-CM | POA: Diagnosis not present

## 2022-11-04 DIAGNOSIS — Z789 Other specified health status: Secondary | ICD-10-CM

## 2022-11-04 DIAGNOSIS — Z130 Encounter for screening for diseases of the blood and blood-forming organs and certain disorders involving the immune mechanism: Secondary | ICD-10-CM | POA: Diagnosis not present

## 2022-11-05 ENCOUNTER — Encounter: Payer: Self-pay | Admitting: Family Medicine

## 2022-11-05 LAB — CMP14+EGFR
ALT: 17 IU/L (ref 0–44)
AST: 26 IU/L (ref 0–40)
Albumin: 3.9 g/dL (ref 3.8–4.8)
Alkaline Phosphatase: 67 IU/L (ref 44–121)
BUN/Creatinine Ratio: 17 (ref 10–24)
BUN: 32 mg/dL — ABNORMAL HIGH (ref 8–27)
Bilirubin Total: 0.4 mg/dL (ref 0.0–1.2)
CO2: 22 mmol/L (ref 20–29)
Calcium: 9 mg/dL (ref 8.6–10.2)
Chloride: 103 mmol/L (ref 96–106)
Creatinine, Ser: 1.83 mg/dL — ABNORMAL HIGH (ref 0.76–1.27)
Globulin, Total: 3.2 g/dL (ref 1.5–4.5)
Glucose: 83 mg/dL (ref 70–99)
Potassium: 5.1 mmol/L (ref 3.5–5.2)
Sodium: 138 mmol/L (ref 134–144)
Total Protein: 7.1 g/dL (ref 6.0–8.5)
eGFR: 38 mL/min/{1.73_m2} — ABNORMAL LOW (ref >59)

## 2022-11-05 LAB — CBC WITH DIFFERENTIAL/PLATELET
Basophils Absolute: 0.1 10*3/uL (ref 0.0–0.2)
Basos: 1 %
EOS (ABSOLUTE): 0.5 10*3/uL — ABNORMAL HIGH (ref 0.0–0.4)
Eos: 10 %
Hematocrit: 40.5 % (ref 37.5–51.0)
Hemoglobin: 12.9 g/dL — ABNORMAL LOW (ref 13.0–17.7)
Immature Grans (Abs): 0 10*3/uL (ref 0.0–0.1)
Immature Granulocytes: 0 %
Lymphocytes Absolute: 2.6 10*3/uL (ref 0.7–3.1)
Lymphs: 53 %
MCH: 27.9 pg (ref 26.6–33.0)
MCHC: 31.9 g/dL (ref 31.5–35.7)
MCV: 88 fL (ref 79–97)
Monocytes Absolute: 0.5 10*3/uL (ref 0.1–0.9)
Monocytes: 10 %
Neutrophils Absolute: 1.3 10*3/uL — ABNORMAL LOW (ref 1.4–7.0)
Neutrophils: 26 %
Platelets: 192 10*3/uL (ref 150–450)
RBC: 4.63 x10E6/uL (ref 4.14–5.80)
RDW: 15 % (ref 11.6–15.4)
WBC: 5 10*3/uL (ref 3.4–10.8)

## 2022-11-05 LAB — LIPID PANEL
Chol/HDL Ratio: 4.5 ratio (ref 0.0–5.0)
Cholesterol, Total: 184 mg/dL (ref 100–199)
HDL: 41 mg/dL (ref >39)
LDL Chol Calc (NIH): 122 mg/dL — ABNORMAL HIGH (ref 0–99)
Triglycerides: 113 mg/dL (ref 0–149)
VLDL Cholesterol Cal: 21 mg/dL (ref 5–40)

## 2022-11-05 NOTE — Progress Notes (Signed)
Established Patient Office Visit  Subjective    Patient ID: Nathaniel Hartman, male    DOB: 1951-02-06  Age: 72 y.o. MRN: [id number]  CC:  Chief Complaint  Patient presents with   Medical Management of Chronic Issues    Patient is here for their 6 month follow-up Patient has no concerns today Care gaps have been discussed with patient      HPI Nathaniel Hartman presents for routine annual exam.   Outpatient Encounter Medications as of 11/04/2022  Medication Sig   budesonide-formoterol (SYMBICORT) 160-4.5 MCG/ACT inhaler Inhale 2 puffs into the lungs 2 (two) times daily.   cetirizine (ZYRTEC) 10 MG tablet Take 1 tablet (10 mg total) by mouth daily.   lisinopril (ZESTRIL) 40 MG tablet Take 1 tablet (40 mg total) by mouth daily.   montelukast (SINGULAIR) 10 MG tablet Take 1 tablet (10 mg total) by mouth at bedtime. (Patient taking differently: Take 10 mg by mouth daily as needed (for allergies).)   predniSONE (DELTASONE) 10 MG tablet Label  & dispense according to the schedule below. 4 Pills PO on day one, 3Pills PO on day two, 2 Pills PO on day three, 1 Pills PO on day four,  then STOP.  Total of 10  tabs   triamcinolone cream (KENALOG) 0.1 % APPLY TO AFFECTED AREA TWICE A DAY (Patient taking differently: Apply 1 Application topically 2 (two) times daily. APPLY TO AFFECTED AREA TWICE A DAY)   albuterol (PROVENTIL) (2.5 MG/3ML) 0.083% nebulizer solution inhale 1 vial via nebulizer ever 4hrs as needed for wheezing / shortness of breath   fluticasone (FLONASE) 50 MCG/ACT nasal spray Place 2 sprays into both nostrils daily.   No facility-administered encounter medications on file as of 11/04/2022.    Past Medical History:  Diagnosis Date   Asthma    Bilateral pneumonia 07/31/2007   Chronic cough    Chronic kidney disease    COPD (chronic obstructive pulmonary disease) (HCC)    GERD (gastroesophageal reflux disease)    pt denies this (05/31/14)   Hypertension    Pansinusitis    Shortness  of breath     Past Surgical History:  Procedure Laterality Date   EYE SURGERY Right 2008   cataract surgery   left hand surgery  08/2006   NASAL SINUS SURGERY Bilateral 06/02/2014   Procedure: BILATERAL ENDOSCOPIC SINUS SURGERY WITH FUSION ;  Surgeon: Osborn Coho, MD;  Location: [facility] OR;  Service: ENT;  Laterality: Bilateral;   POLYPECTOMY N/A 06/02/2014   Procedure: POLYPECTOMY NASAL;  Surgeon: Osborn Coho, MD;  [facility];  Service: ENT;  Laterality: N/A;    No family history on file.  Social History   Socioeconomic History   Marital status: Single    Spouse name: Not on file   Number of children: Not on file   Years of education: Not on file   Highest education level: Not on file  Occupational History   Occupation: metal work  Tobacco Use   Smoking status: Former    Current packs/day: 0.00    Average packs/day: 0.5 packs/day for 4.0 years (2.0 ttl pk-yrs)    Types: Cigarettes    Start date: 02/11/1993    Quit date: 02/11/1997    Years since quitting: 25.7   Smokeless tobacco: Never  Substance and Sexual Activity   Alcohol use: No   Drug use: No   Sexual activity: Not on file  Other Topics Concern   Not on file  Social History Narrative  Not on file   Social Determinants of Health   Financial Resource Strain: Not on file  Food Insecurity: No Food Insecurity (01/03/2022)   Hunger Vital Sign    Worried About Running Out of Food in the Last Year: Never true    Ran Out of Food in the Last Year: Never true  Transportation Needs: No Transportation Needs (01/07/2022)   PRAPARE - Administrator, Civil Service (Medical): No    Lack of Transportation (Non-Medical): No  Physical Activity: Not on file  Stress: Not on file  Social Connections: Not on file  Intimate Partner Violence: Not At Risk (01/03/2022)   Humiliation, Afraid, Rape, and Kick questionnaire    Fear of Current or Ex-Partner: No    Emotionally Abused: No    Physically Abused: No     Sexually Abused: No    Review of Systems  All other systems reviewed and are negative.       Objective    BP 138/87   Pulse 74   Temp 98 F (36.7 C) (Oral)   Resp 16   Wt 180 lb (81.6 kg)   SpO2 95%   BMI 23.11 kg/m   Physical Exam Vitals and nursing note reviewed.  Constitutional:      General: He is not in acute distress. HENT:     Head: Normocephalic and atraumatic.     Right Ear: Tympanic membrane, ear canal and external ear normal.     Left Ear: Tympanic membrane, ear canal and external ear normal.     Nose: Nose normal.     Mouth/Throat:     Mouth: Mucous membranes are moist.     Pharynx: Oropharynx is clear.  Eyes:     Conjunctiva/sclera: Conjunctivae normal.     Pupils: Pupils are equal, round, and reactive to light.  Neck:     Thyroid: No thyromegaly.  Cardiovascular:     Rate and Rhythm: Normal rate and regular rhythm.     Heart sounds: Normal heart sounds. No murmur heard. Pulmonary:     Effort: Pulmonary effort is normal.     Breath sounds: Normal breath sounds.  Abdominal:     General: There is no distension.     Palpations: Abdomen is soft. There is no mass.     Tenderness: There is no abdominal tenderness.     Hernia: There is no hernia in the left inguinal area or right inguinal area.  Musculoskeletal:        General: Normal range of motion.     Cervical back: Normal range of motion and neck supple.     Right lower leg: No edema.     Left lower leg: No edema.  Skin:    General: Skin is warm and dry.  Neurological:     General: No focal deficit present.     Mental Status: He is alert and oriented to person, place, and time. Mental status is at baseline.  Psychiatric:        Mood and Affect: Mood normal.        Behavior: Behavior normal.         Assessment & Plan:   Annual physical exam -     CMP14+EGFR  Encounter for immunization -     Flu Vaccine Trivalent High Dose (Fluad)  Screening for deficiency anemia -     CBC with  Differential/Platelet  Screening for lipid disorders -     Lipid panel  Language barrier to communication  No follow-ups on file.   Nathaniel Raymond, MD

## 2023-02-03 ENCOUNTER — Encounter: Payer: Self-pay | Admitting: Family Medicine

## 2023-02-03 ENCOUNTER — Ambulatory Visit: Payer: Medicare PPO | Admitting: Family Medicine

## 2023-02-03 VITALS — BP 138/86 | HR 71 | Temp 97.8°F | Resp 16 | Ht 74.0 in | Wt 182.0 lb

## 2023-02-03 DIAGNOSIS — J4540 Moderate persistent asthma, uncomplicated: Secondary | ICD-10-CM | POA: Diagnosis not present

## 2023-02-03 DIAGNOSIS — I10 Essential (primary) hypertension: Secondary | ICD-10-CM

## 2023-02-03 DIAGNOSIS — Z789 Other specified health status: Secondary | ICD-10-CM

## 2023-02-03 MED ORDER — BUDESONIDE-FORMOTEROL FUMARATE 160-4.5 MCG/ACT IN AERO: 2.0000 | INHALATION_SPRAY | Freq: Two times a day (BID) | RESPIRATORY_TRACT | 11 refills | Status: DC

## 2023-02-03 MED ORDER — LISINOPRIL 40 MG PO TABS: 40.0000 mg | ORAL_TABLET | Freq: Every day | ORAL | 1 refills | Status: DC

## 2023-02-03 NOTE — Progress Notes (Signed)
Established Patient Office Visit  Subjective    Patient ID: Nathaniel Hartman, male    DOB: [birth year]  Age: 72 y.o. MRN: [id number]  CC:  Chief Complaint  Patient presents with   Follow-up    3 month    HPI Kenner Agro presents for routine follow up of hypertension and asthma. Patient denies acute complaints. This visit was aided by interpreter.   Outpatient Encounter Medications as of 02/03/2023  Medication Sig   cetirizine (ZYRTEC) 10 MG tablet Take 1 tablet (10 mg total) by mouth daily.   montelukast (SINGULAIR) 10 MG tablet Take 1 tablet (10 mg total) by mouth at bedtime. (Patient taking differently: Take 10 mg by mouth daily as needed (for allergies).)   predniSONE (DELTASONE) 10 MG tablet Label  & dispense according to the schedule below. 4 Pills PO on day one, 3Pills PO on day two, 2 Pills PO on day three, 1 Pills PO on day four,  then STOP.  Total of 10  tabs   triamcinolone cream (KENALOG) 0.1 % APPLY TO AFFECTED AREA TWICE A DAY (Patient taking differently: Apply 1 Application topically 2 (two) times daily. APPLY TO AFFECTED AREA TWICE A DAY)   [DISCONTINUED] budesonide-formoterol (SYMBICORT) 160-4.5 MCG/ACT inhaler Inhale 2 puffs into the lungs 2 (two) times daily.   [DISCONTINUED] lisinopril (ZESTRIL) 40 MG tablet Take 1 tablet (40 mg total) by mouth daily.   budesonide-formoterol (SYMBICORT) 160-4.5 MCG/ACT inhaler Inhale 2 puffs into the lungs 2 (two) times daily.   lisinopril (ZESTRIL) 40 MG tablet Take 1 tablet (40 mg total) by mouth daily.   No facility-administered encounter medications on file as of 02/03/2023.    Past Medical History:  Diagnosis Date   Asthma    Bilateral pneumonia 07/31/2007   Chronic cough    Chronic kidney disease    COPD (chronic obstructive pulmonary disease) (HCC)    GERD (gastroesophageal reflux disease)    pt denies this (05/31/14)   Hypertension    Pansinusitis    Shortness of breath     Past Surgical History:  Procedure  Laterality Date   EYE SURGERY Right 2008   cataract surgery   left hand surgery  08/2006   NASAL SINUS SURGERY Bilateral 06/02/2014   Procedure: BILATERAL ENDOSCOPIC SINUS SURGERY WITH FUSION ;  Surgeon: Osborn Coho, MD;  Location: [facility] OR;  Service: ENT;  Laterality: Bilateral;   POLYPECTOMY N/A 06/02/2014   Procedure: POLYPECTOMY NASAL;  Surgeon: Osborn Coho, MD;  Location: [facility] OR;  Service: ENT;  Laterality: N/A;    History reviewed. No pertinent family history.  Social History   Socioeconomic History   Marital status: Single    Spouse name: Not on file   Number of children: Not on file   Years of education: Not on file   Highest education level: Not on file  Occupational History   Occupation: metal work  Tobacco Use   Smoking status: Former    Current packs/day: 0.00    Average packs/day: 0.5 packs/day for 4.0 years (2.0 ttl pk-yrs)    Types: Cigarettes    Start date: 02/11/1993    Quit date: 02/11/1997    Years since quitting: 25.9   Smokeless tobacco: Never  Substance and Sexual Activity   Alcohol use: No   Drug use: No   Sexual activity: Not on file  Other Topics Concern   Not on file  Social History Narrative   Not on file   Social Drivers of Health   Financial  Resource Strain: Low Risk  (02/03/2023)   Overall Financial Resource Strain (CARDIA)    Difficulty of Paying Living Expenses: Not hard at all  Food Insecurity: No Food Insecurity (02/03/2023)   Hunger Vital Sign    Worried About Running Out of Food in the Last Year: Never true    Ran Out of Food in the Last Year: Never true  Transportation Needs: No Transportation Needs (02/03/2023)   PRAPARE - Administrator, Civil Service (Medical): No    Lack of Transportation (Non-Medical): No  Physical Activity: Sufficiently Active (02/03/2023)   Exercise Vital Sign    Days of Exercise per Week: 5 days    Minutes of Exercise per Session: 30 min  Stress: No Stress Concern Present (02/03/2023)    [employer] of Occupational Health - Occupational Stress Questionnaire    Feeling of Stress : Not at all  Social Connections: Socially Integrated (02/03/2023)   Social Connection and Isolation Panel [NHANES]    Frequency of Communication with Friends and Family: Once a week    Frequency of Social Gatherings with Friends and Family: More than three times a week    Attends Religious Services: More than 4 times per year    Active Member of [employer] or Organizations: Yes    Attends Banker Meetings: Never    Marital Status: Married  Catering manager Violence: Not At Risk (02/03/2023)   Humiliation, Afraid, Rape, and Kick questionnaire    Fear of Current or Ex-Partner: No    Emotionally Abused: No    Physically Abused: No    Sexually Abused: No    Review of Systems  All other systems reviewed and are negative.       Objective    BP 138/86 (BP Location: Right Arm, Patient Position: Sitting, Cuff Size: Normal)   Pulse 71   Temp 97.8 F (36.6 C) (Oral)   Resp 16   Ht 6\' 2"  (1.88 m)   Wt 182 lb (82.6 kg)   SpO2 96%   BMI 23.37 kg/m   Physical Exam Vitals and nursing note reviewed.  Constitutional:      General: He is not in acute distress. Cardiovascular:     Rate and Rhythm: Normal rate and regular rhythm.  Pulmonary:     Effort: Pulmonary effort is normal.     Breath sounds: Normal breath sounds.  Abdominal:     Palpations: Abdomen is soft.     Tenderness: There is no abdominal tenderness.  Neurological:     General: No focal deficit present.     Mental Status: He is alert and oriented to person, place, and time.         Assessment & Plan:   Essential hypertension -     Basic metabolic panel -     CBC with Differential/Platelet  Moderate persistent asthma without complication -     Budesonide-Formoterol Fumarate; Inhale 2 puffs into the lungs 2 (two) times daily.  Dispense: 10.2 g; Refill: 11  Language barrier to communication  Other  orders -     Lisinopril; Take 1 tablet (40 mg total) by mouth daily.  Dispense: 90 tablet; Refill: 1     No follow-ups on file.   [name], MD

## 2023-02-04 LAB — CBC WITH DIFFERENTIAL/PLATELET
Basophils Absolute: 0.1 10*3/uL (ref 0.0–0.2)
Basos: 1 %
EOS (ABSOLUTE): 0.8 10*3/uL — ABNORMAL HIGH (ref 0.0–0.4)
Eos: 14 %
Hematocrit: 42.2 % (ref 37.5–51.0)
Hemoglobin: 13.9 g/dL (ref 13.0–17.7)
Immature Grans (Abs): 0 10*3/uL (ref 0.0–0.1)
Immature Granulocytes: 0 %
Lymphocytes Absolute: 2 10*3/uL (ref 0.7–3.1)
Lymphs: 36 %
MCH: 27.9 pg (ref 26.6–33.0)
MCHC: 32.9 g/dL (ref 31.5–35.7)
MCV: 85 fL (ref 79–97)
Monocytes Absolute: 0.4 10*3/uL (ref 0.1–0.9)
Monocytes: 8 %
Neutrophils Absolute: 2.3 10*3/uL (ref 1.4–7.0)
Neutrophils: 41 %
Platelets: 200 10*3/uL (ref 150–450)
RBC: 4.99 x10E6/uL (ref 4.14–5.80)
RDW: 14.6 % (ref 11.6–15.4)
WBC: 5.7 10*3/uL (ref 3.4–10.8)

## 2023-02-04 LAB — BASIC METABOLIC PANEL
BUN/Creatinine Ratio: 15 (ref 10–24)
BUN: 20 mg/dL (ref 8–27)
CO2: 22 mmol/L (ref 20–29)
Calcium: 9.3 mg/dL (ref 8.6–10.2)
Chloride: 105 mmol/L (ref 96–106)
Creatinine, Ser: 1.31 mg/dL — ABNORMAL HIGH (ref 0.76–1.27)
Glucose: 87 mg/dL (ref 70–99)
Potassium: 5 mmol/L (ref 3.5–5.2)
Sodium: 138 mmol/L (ref 134–144)
eGFR: 57 mL/min/{1.73_m2} — ABNORMAL LOW (ref >59)

## 2023-04-07 ENCOUNTER — Other Ambulatory Visit (HOSPITAL_COMMUNITY): Payer: Self-pay

## 2023-05-07 ENCOUNTER — Ambulatory Visit: Payer: Medicare PPO | Admitting: Family Medicine

## 2023-05-07 ENCOUNTER — Encounter: Payer: Self-pay | Admitting: Family Medicine

## 2023-05-07 VITALS — BP 139/83 | HR 73 | Temp 97.6°F | Resp 16 | Ht 74.0 in | Wt 178.6 lb

## 2023-05-07 DIAGNOSIS — I10 Essential (primary) hypertension: Secondary | ICD-10-CM | POA: Diagnosis not present

## 2023-05-07 DIAGNOSIS — Z789 Other specified health status: Secondary | ICD-10-CM | POA: Diagnosis not present

## 2023-05-07 MED ORDER — LISINOPRIL 40 MG PO TABS: 40.0000 mg | ORAL_TABLET | Freq: Every day | ORAL | 1 refills | Status: DC

## 2023-05-09 ENCOUNTER — Encounter: Payer: Self-pay | Admitting: Family Medicine

## 2023-05-09 NOTE — Progress Notes (Signed)
 Established Patient Office Visit  Subjective    Patient ID: Nathaniel Hartman, male    DOB: 10-Mar-1950  Age: 73 y.o. MRN: [id number]  CC:  Chief Complaint  Patient presents with   Follow-up    3 month, knee pain    HPI [name] presents patient here for routine follow up of hypertension. Patient denies acute complaints.   Outpatient Encounter Medications as of 05/07/2023  Medication Sig   budesonide-formoterol (SYMBICORT) 160-4.5 MCG/ACT inhaler Inhale 2 puffs into the lungs 2 (two) times daily.   cetirizine (ZYRTEC) 10 MG tablet Take 1 tablet (10 mg total) by mouth daily.   montelukast (SINGULAIR) 10 MG tablet Take 1 tablet (10 mg total) by mouth at bedtime. (Patient taking differently: Take 10 mg by mouth daily as needed (for allergies).)   predniSONE (DELTASONE) 10 MG tablet Label  & dispense according to the schedule below. 4 Pills PO on day one, 3Pills PO on day two, 2 Pills PO on day three, 1 Pills PO on day four,  then STOP.  Total of 10  tabs   triamcinolone cream (KENALOG) 0.1 % APPLY TO AFFECTED AREA TWICE A DAY (Patient taking differently: Apply 1 Application topically 2 (two) times daily. APPLY TO AFFECTED AREA TWICE A DAY)   [DISCONTINUED] lisinopril (ZESTRIL) 40 MG tablet Take 1 tablet (40 mg total) by mouth daily.   lisinopril (ZESTRIL) 40 MG tablet Take 1 tablet (40 mg total) by mouth daily.   No facility-administered encounter medications on file as of 05/07/2023.    Past Medical History:  Diagnosis Date   Asthma    Bilateral pneumonia 07/31/2007   Chronic cough    Chronic kidney disease    COPD (chronic obstructive pulmonary disease) (HCC)    GERD (gastroesophageal reflux disease)    pt denies this (05/31/14)   Hypertension    Pansinusitis    Shortness of breath     Past Surgical History:  Procedure Laterality Date   EYE SURGERY Right 2008   cataract surgery   left hand surgery  08/2006   NASAL SINUS SURGERY Bilateral 06/02/2014   Procedure: BILATERAL  ENDOSCOPIC SINUS SURGERY WITH FUSION ;  Surgeon: Osborn Coho, MD;  Location: [facility] OR;  Service: ENT;  Laterality: Bilateral;   POLYPECTOMY N/A 06/02/2014   Procedure: POLYPECTOMY NASAL;  Surgeon: Osborn Coho, MD;  Location: [facility] OR;  Service: ENT;  Laterality: N/A;    History reviewed. No pertinent family history.  Social History   Socioeconomic History   Marital status: Single    Spouse name: Not on file   Number of children: Not on file   Years of education: Not on file   Highest education level: 12th grade  Occupational History   Occupation: metal work  Tobacco Use   Smoking status: Former    Current packs/day: 0.00    Average packs/day: 0.5 packs/day for 4.0 years (2.0 ttl pk-yrs)    Types: Cigarettes    Start date: 02/11/1993    Quit date: 02/11/1997    Years since quitting: 26.2   Smokeless tobacco: Never  Vaping Use   Vaping status: Never Used  Substance and Sexual Activity   Alcohol use: No   Drug use: No   Sexual activity: Not on file  Other Topics Concern   Not on file  Social History Narrative   Not on file   Social Drivers of Health   Financial Resource Strain: Low Risk  (05/06/2023)   Overall Financial Resource Strain (CARDIA)  Difficulty of Paying Living Expenses: Not very hard  Food Insecurity: No Food Insecurity (05/06/2023)   Hunger Vital Sign    Worried About Running Out of Food in the Last Year: Never true    Ran Out of Food in the Last Year: Never true  Transportation Needs: No Transportation Needs (05/06/2023)   PRAPARE - Administrator, Civil Service (Medical): No    Lack of Transportation (Non-Medical): No  Physical Activity: Insufficiently Active (05/06/2023)   Exercise Vital Sign    Days of Exercise per Week: 3 days    Minutes of Exercise per Session: 30 min  Stress: No Stress Concern Present (05/06/2023)   [employer] of Occupational Health - Occupational Stress Questionnaire    Feeling of Stress : Not at all  Social  Connections: Socially Integrated (05/06/2023)   Social Connection and Isolation Panel [NHANES]    Frequency of Communication with Friends and Family: Twice a week    Frequency of Social Gatherings with Friends and Family: Twice a week    Attends Religious Services: 1 to 4 times per year    Active Member of [employer] or Organizations: Yes    Attends Banker Meetings: 1 to 4 times per year    Marital Status: Married  Catering manager Violence: Not At Risk (02/03/2023)   Humiliation, Afraid, Rape, and Kick questionnaire    Fear of Current or Ex-Partner: No    Emotionally Abused: No    Physically Abused: No    Sexually Abused: No    Review of Systems  All other systems reviewed and are negative.       Objective    BP 139/83   Pulse 73   Temp 97.6 F (36.4 C) (Oral)   Resp 16   Ht 6\' 2"  (1.88 m)   Wt 178 lb 9.6 oz (81 kg)   SpO2 95%   BMI 22.93 kg/m   Physical Exam Vitals and nursing note reviewed.  Constitutional:      General: He is not in acute distress. Cardiovascular:     Rate and Rhythm: Normal rate and regular rhythm.  Pulmonary:     Effort: Pulmonary effort is normal.     Breath sounds: Normal breath sounds.  Abdominal:     Palpations: Abdomen is soft.     Tenderness: There is no abdominal tenderness.  Neurological:     General: No focal deficit present.     Mental Status: He is alert and oriented to person, place, and time.         Assessment & Plan:   Essential hypertension  Language barrier to communication  Other orders -     Lisinopril; Take 1 tablet (40 mg total) by mouth daily.  Dispense: 90 tablet; Refill: 1     Return in about 6 months (around 11/07/2023) for follow up, chronic med issues.   [name], MD

## 2023-09-08 ENCOUNTER — Telehealth: Payer: Self-pay

## 2023-09-08 NOTE — Telephone Encounter (Signed)
 Copied from CRM [id number]. Topic: Appointments - Appointment Scheduling >> Sep 08, 2023 12:10 PM Nathaniel Hartman wrote: Patient is needing an appointment for ear pain that started yesterday. No appointments available till end of August, wanting to know if he can be worked in. Please call Signa (wife) [phone] to advise.

## 2023-09-08 NOTE — Telephone Encounter (Signed)
 Please schedule patient for acute appointment, if available or put on wait list for sooner appointment. Thank you.

## 2023-09-09 ENCOUNTER — Ambulatory Visit
Admission: EM | Admit: 2023-09-09 | Discharge: 2023-09-09 | Disposition: A | Discharge status: Home / Self Care | Attending: Family Medicine | Admitting: Family Medicine

## 2023-09-09 DIAGNOSIS — H6690 Otitis media, unspecified, unspecified ear: Secondary | ICD-10-CM

## 2023-09-09 MED ORDER — AMOXICILLIN 875 MG PO TABS: 875.0000 mg | ORAL_TABLET | Freq: Two times a day (BID) | ORAL | 0 refills | Status: AC

## 2023-09-09 NOTE — ED Triage Notes (Addendum)
 No interpreter available.   My right ear is hurting starting Sunday morning. No fever.

## 2023-09-09 NOTE — ED Provider Notes (Signed)
 [facility]    CSN: [id number] Arrival date & time: 09/09/23  9147      History   Chief Complaint Chief Complaint  Patient presents with   Otalgia    HPI Nathaniel Hartman is a 73 y.o. male.   R ear pain x 2-3 days No fevers, injury Pt does not swim Does endorse some clear drainage from ear L ear feels normal  Denies sick contacts, vomiting, cough, congestion, headache, vision changes, hearing loss, ringing  Has tried tylenol  which helps temporarily    Otalgia   Past Medical History:  Diagnosis Date   Asthma    Bilateral pneumonia 07/31/2007   Chronic cough    Chronic kidney disease    COPD (chronic obstructive pulmonary disease) (HCC)    GERD (gastroesophageal reflux disease)    pt denies this (05/31/14)   Hypertension    Pansinusitis    Shortness of breath     Patient Active Problem List   Diagnosis Date Noted   COVID-19 01/04/2022   COVID-19 virus infection 01/03/2022   Nasal sinus polyp 06/02/2014    Class: Chronic   Pneumonia 05/03/2014   CKD (chronic kidney disease)    COPD exacerbation (HCC) 04/29/2014   Acute respiratory failure with hypoxia (HCC) 04/28/2014   CAP (community acquired pneumonia) 04/28/2014   Essential hypertension, benign 12/07/2012   Chronic rhinitis 10/24/2010   COPD GOLD II with marked reversibility  11/30/2007   G E R D 11/05/2007    Past Surgical History:  Procedure Laterality Date   EYE SURGERY Right 2008   cataract surgery   left hand surgery  08/2006   NASAL SINUS SURGERY Bilateral 06/02/2014   Procedure: BILATERAL ENDOSCOPIC SINUS SURGERY WITH FUSION ;  Surgeon: Alm Bouche, MD;  Location: [facility] OR;  Service: ENT;  Laterality: Bilateral;   POLYPECTOMY N/A 06/02/2014   Procedure: POLYPECTOMY NASAL;  Surgeon: Alm Bouche, MD;  Location: [facility] OR;  Service: ENT;  Laterality: N/A;       Home Medications    Prior to Admission medications   Medication Sig Start Date End Date Taking? Authorizing Provider   amoxicillin  (AMOXIL ) 875 MG tablet Take 1 tablet (875 mg total) by mouth 2 (two) times daily for 7 days. 09/09/23 09/16/23 Yes Romelle Booty, MD  budesonide -formoterol  (SYMBICORT ) 160-4.5 MCG/ACT inhaler Inhale 2 puffs into the lungs 2 (two) times daily. 02/03/23   Tanda Bleacher, MD  cetirizine  (ZYRTEC ) 10 MG tablet Take 1 tablet (10 mg total) by mouth daily. 02/25/22   Tanda Bleacher, MD  lisinopril  (ZESTRIL ) 40 MG tablet Take 1 tablet (40 mg total) by mouth daily. 05/07/23   Tanda Bleacher, MD  montelukast  (SINGULAIR ) 10 MG tablet Take 1 tablet (10 mg total) by mouth at bedtime. Patient taking differently: Take 10 mg by mouth daily as needed (for allergies). 06/04/21   Tanda Bleacher, MD  predniSONE  (DELTASONE ) 10 MG tablet Label  & dispense according to the schedule below. 4 Pills PO on day one, 3Pills PO on day two, 2 Pills PO on day three, 1 Pills PO on day four,  then STOP.  Total of 10  tabs 01/06/22   Jerri Keys, MD  triamcinolone  cream (KENALOG ) 0.1 % APPLY TO AFFECTED AREA TWICE A DAY Patient taking differently: Apply 1 Application topically 2 (two) times daily. APPLY TO AFFECTED AREA TWICE A DAY 06/04/21   Tanda Bleacher, MD    Family History History reviewed. No pertinent family history.  Social History Social History   Tobacco Use  Smoking status: Former    Current packs/day: 0.00    Average packs/day: 0.5 packs/day for 4.0 years (2.0 ttl pk-yrs)    Types: Cigarettes    Start date: 02/11/1993    Quit date: 02/11/1997    Years since quitting: 26.5   Smokeless tobacco: Never  Vaping Use   Vaping status: Never Used  Substance Use Topics   Alcohol use: No   Drug use: No     Allergies   Benadryl  [diphenhydramine  hcl (sleep)], Motrin [ibuprofen], and Nsaids   Review of Systems Review of Systems  HENT:  Positive for ear pain.   Otherwise per HPI   Physical Exam Triage Vital Signs ED Triage Vitals  Encounter Vitals Group     BP 09/09/23 0909 115/75     Girls Systolic BP  Percentile --      Girls Diastolic BP Percentile --      Boys Systolic BP Percentile --      Boys Diastolic BP Percentile --      Pulse Rate 09/09/23 0909 79     Resp 09/09/23 0909 18     Temp 09/09/23 0909 97.7 F (36.5 C)     Temp Source 09/09/23 0909 Oral     SpO2 09/09/23 0909 97 %     Weight 09/09/23 0907 178 lb 9.2 oz (81 kg)     Height 09/09/23 0907 6' 2 (1.88 m)     Head Circumference --      Peak Flow --      Pain Score 09/09/23 0857 4     Pain Loc --      Pain Education --      Exclude from Growth Chart --    No data found.  Updated Vital Signs BP 115/75 (BP Location: Left Arm)   Pulse 79   Temp 97.7 F (36.5 C) (Oral)   Resp 18   Ht 6' 2 (1.88 m)   Wt 81 kg   SpO2 97%   BMI 22.93 kg/m   Visual Acuity Right Eye Distance:   Left Eye Distance:   Bilateral Distance:    Right Eye Near:   Left Eye Near:    Bilateral Near:     Physical Exam Constitutional:      General: He is not in acute distress.    Appearance: Normal appearance.  HENT:     Head: Normocephalic and atraumatic.     Right Ear: Hearing and external ear normal. Tympanic membrane is erythematous.     Left Ear: Hearing, tympanic membrane, ear canal and external ear normal.     Ears:     Comments: R TM erythematous and dull compared to L, no obvious effusion or drainage in canal noted. Nontender to palpation of external ear Pulmonary:     Effort: Pulmonary effort is normal. No respiratory distress.  Musculoskeletal:        General: No swelling or deformity.     Cervical back: Neck supple.  Neurological:     General: No focal deficit present.     Mental Status: He is alert. Mental status is at baseline.      UC Treatments / Results  Labs (all labs ordered are listed, but only abnormal results are displayed) Labs Reviewed - No data to display  EKG   Radiology No results found.  Procedures Procedures (including critical care time)  Medications Ordered in UC Medications - No  data to display  Initial Impression / Assessment and Plan / UC Course  I have reviewed the triage vital signs and the nursing notes.  Pertinent labs & imaging results that were available during my care of the patient were reviewed by me and considered in my medical decision making (see chart for details).     Right ear pain x 2 to 3 days Erythema of right TM on exam with decreased cone of light and dullness compared to left  Treat for acute otitis media with amoxicillin  875 twice daily x 7 days Discussed return precautions Continue to use OTC Tylenol  for pain control   Final Clinical Impressions(s) / UC Diagnoses   Final diagnoses:  Acute otitis media, unspecified otitis media type     Discharge Instructions      Please take amoxicillin  twice daily for 7 days  Please take medical attention if your symptoms worsen or do not improve or if you start having fevers, drainage from your ear, worsening of your pain, or any hearing loss     ED Prescriptions     Medication Sig Dispense Auth. Provider   amoxicillin  (AMOXIL ) 875 MG tablet Take 1 tablet (875 mg total) by mouth 2 (two) times daily for 7 days. 14 tablet Romelle Booty, MD      PDMP not reviewed this encounter.   Romelle Booty, MD 09/09/23 1014

## 2023-09-09 NOTE — Discharge Instructions (Addendum)
 Please take amoxicillin  twice daily for 7 days  Please take medical attention if your symptoms worsen or do not improve or if you start having fevers, drainage from your ear, worsening of your pain, or any hearing loss

## 2023-09-10 NOTE — Telephone Encounter (Signed)
 Pt went to UC.

## 2023-11-07 ENCOUNTER — Encounter: Payer: Self-pay | Admitting: Family Medicine

## 2023-11-07 ENCOUNTER — Ambulatory Visit (INDEPENDENT_AMBULATORY_CARE_PROVIDER_SITE_OTHER): Admitting: Family Medicine

## 2023-11-07 VITALS — BP 132/87 | HR 81 | Ht 74.0 in | Wt 165.2 lb

## 2023-11-07 DIAGNOSIS — Z23 Encounter for immunization: Secondary | ICD-10-CM | POA: Diagnosis not present

## 2023-11-07 DIAGNOSIS — J309 Allergic rhinitis, unspecified: Secondary | ICD-10-CM

## 2023-11-07 DIAGNOSIS — I10 Essential (primary) hypertension: Secondary | ICD-10-CM | POA: Diagnosis not present

## 2023-11-07 MED ORDER — CETIRIZINE HCL 10 MG PO TABS: 10.0000 mg | ORAL_TABLET | Freq: Every day | ORAL | 3 refills | Status: AC

## 2023-11-07 MED ORDER — LISINOPRIL 40 MG PO TABS: 40.0000 mg | ORAL_TABLET | Freq: Every day | ORAL | 1 refills | Status: AC

## 2023-11-07 NOTE — Progress Notes (Signed)
 Established Patient Office Visit  Subjective    Patient ID: Nathaniel Hartman, male    DOB: 1950/03/05  Age: 73 y.o. MRN: [id number]  CC:  Chief Complaint  Patient presents with   Medical Management of Chronic Issues    Wants flu shot     HPI Nathaniel Hartman presents for follow up of hypertension. Patient denies acute complaints. Patient declined use of interpreter.   Outpatient Encounter Medications as of 11/07/2023  Medication Sig   budesonide -formoterol  (SYMBICORT ) 160-4.5 MCG/ACT inhaler Inhale 2 puffs into the lungs 2 (two) times daily.   [DISCONTINUED] cetirizine  (ZYRTEC ) 10 MG tablet Take 1 tablet (10 mg total) by mouth daily.   [DISCONTINUED] lisinopril  (ZESTRIL ) 40 MG tablet Take 1 tablet (40 mg total) by mouth daily.   cetirizine  (ZYRTEC ) 10 MG tablet Take 1 tablet (10 mg total) by mouth daily.   lisinopril  (ZESTRIL ) 40 MG tablet Take 1 tablet (40 mg total) by mouth daily.   montelukast  (SINGULAIR ) 10 MG tablet Take 1 tablet (10 mg total) by mouth at bedtime. (Patient not taking: Reported on 11/07/2023)   predniSONE  (DELTASONE ) 10 MG tablet Label  & dispense according to the schedule below. 4 Pills PO on day one, 3Pills PO on day two, 2 Pills PO on day three, 1 Pills PO on day four,  then STOP.  Total of 10  tabs (Patient not taking: Reported on 11/07/2023)   triamcinolone  cream (KENALOG ) 0.1 % APPLY TO AFFECTED AREA TWICE A DAY (Patient taking differently: Apply 1 Application topically 2 (two) times daily. APPLY TO AFFECTED AREA TWICE A DAY)   No facility-administered encounter medications on file as of 11/07/2023.    Past Medical History:  Diagnosis Date   Asthma    Bilateral pneumonia 07/31/2007   Chronic cough    Chronic kidney disease    COPD (chronic obstructive pulmonary disease) (HCC)    GERD (gastroesophageal reflux disease)    pt denies this (05/31/14)   Hypertension    Pansinusitis    Shortness of breath     Past Surgical History:  Procedure Laterality Date    EYE SURGERY Right 2008   cataract surgery   left hand surgery  08/2006   NASAL SINUS SURGERY Bilateral 06/02/2014   Procedure: BILATERAL ENDOSCOPIC SINUS SURGERY WITH FUSION ;  Surgeon: Alm Bouche, MD;  Location: [facility] OR;  Service: ENT;  Laterality: Bilateral;   POLYPECTOMY N/A 06/02/2014   Procedure: POLYPECTOMY NASAL;  Surgeon: Alm Bouche, MD;  Location: [facility] OR;  Service: ENT;  Laterality: N/A;    History reviewed. No pertinent family history.  Social History   Socioeconomic History   Marital status: Single    Spouse name: Not on file   Number of children: Not on file   Years of education: Not on file   Highest education level: 12th grade  Occupational History   Occupation: metal work  Tobacco Use   Smoking status: Former    Current packs/day: 0.00    Average packs/day: 0.5 packs/day for 4.0 years (2.0 ttl pk-yrs)    Types: Cigarettes    Start date: 02/11/1993    Quit date: 02/11/1997    Years since quitting: 26.7   Smokeless tobacco: Never  Vaping Use   Vaping status: Never Used  Substance and Sexual Activity   Alcohol use: No   Drug use: No   Sexual activity: Not Currently  Other Topics Concern   Not on file  Social History Narrative   Not on file  Social Drivers of Corporate investment banker Strain: Low Risk  (05/06/2023)   Overall Financial Resource Strain (CARDIA)    Difficulty of Paying Living Expenses: Not very hard  Food Insecurity: No Food Insecurity (05/06/2023)   Hunger Vital Sign    Worried About Running Out of Food in the Last Year: Never true    Ran Out of Food in the Last Year: Never true  Transportation Needs: No Transportation Needs (05/06/2023)   PRAPARE - Administrator, Civil Service (Medical): No    Lack of Transportation (Non-Medical): No  Physical Activity: Insufficiently Active (05/06/2023)   Exercise Vital Sign    Days of Exercise per Week: 3 days    Minutes of Exercise per Session: 30 min  Stress: No Stress Concern Present  (05/06/2023)   [employer] of Occupational Health - Occupational Stress Questionnaire    Feeling of Stress : Not at all  Social Connections: Socially Integrated (05/06/2023)   Social Connection and Isolation Panel    Frequency of Communication with Friends and Family: Twice a week    Frequency of Social Gatherings with Friends and Family: Twice a week    Attends Religious Services: 1 to 4 times per year    Active Member of [employer] or Organizations: Yes    Attends Banker Meetings: 1 to 4 times per year    Marital Status: Married  Catering manager Violence: Not At Risk (02/03/2023)   Humiliation, Afraid, Rape, and Kick questionnaire    Fear of Current or Ex-Partner: No    Emotionally Abused: No    Physically Abused: No    Sexually Abused: No    Review of Systems  All other systems reviewed and are negative.       Objective    BP 132/87   Pulse 81   Ht 6' 2 (1.88 m)   Wt 165 lb 3.2 oz (74.9 kg)   SpO2 95%   BMI 21.21 kg/m   Physical Exam Vitals and nursing note reviewed.  Constitutional:      General: He is not in acute distress. Cardiovascular:     Rate and Rhythm: Normal rate and regular rhythm.  Pulmonary:     Effort: Pulmonary effort is normal.     Breath sounds: Normal breath sounds.  Abdominal:     Palpations: Abdomen is soft.     Tenderness: There is no abdominal tenderness.  Neurological:     General: No focal deficit present.     Mental Status: He is alert and oriented to person, place, and time.         Assessment & Plan:   Essential hypertension  Allergic rhinitis, unspecified seasonality, unspecified trigger -     Cetirizine  HCl; Take 1 tablet (10 mg total) by mouth daily.  Dispense: 100 tablet; Refill: 3  Flu vaccine need  Other orders -     Flu vaccine HIGH DOSE PF(Fluzone Trivalent) -     Lisinopril ; Take 1 tablet (40 mg total) by mouth daily.  Dispense: 90 tablet; Refill: 1     Return in about 6 months (around  05/06/2024) for follow up, chronic med issues.   Tanda Raguel SQUIBB, MD

## 2024-01-24 ENCOUNTER — Other Ambulatory Visit: Payer: Self-pay | Admitting: Family Medicine

## 2024-01-24 DIAGNOSIS — J4540 Moderate persistent asthma, uncomplicated: Secondary | ICD-10-CM

## 2024-03-15 ENCOUNTER — Other Ambulatory Visit: Payer: Self-pay | Admitting: Pharmacist

## 2024-05-07 ENCOUNTER — Ambulatory Visit: Admitting: Family Medicine
